# Patient Record
Sex: Female | Born: 1945
Health system: Southern US, Community
[De-identification: ages and names within clinical notes are randomized; demographics above are authoritative.]

## PROBLEM LIST (undated history)

## (undated) DIAGNOSIS — R1314 Dysphagia, pharyngoesophageal phase: Secondary | ICD-10-CM

## (undated) DIAGNOSIS — I059 Rheumatic mitral valve disease, unspecified: Secondary | ICD-10-CM

## (undated) DIAGNOSIS — E042 Nontoxic multinodular goiter: Secondary | ICD-10-CM

## (undated) DIAGNOSIS — M26629 Arthralgia of temporomandibular joint, unspecified side: Secondary | ICD-10-CM

## (undated) DIAGNOSIS — I499 Cardiac arrhythmia, unspecified: Secondary | ICD-10-CM

## (undated) DIAGNOSIS — D126 Benign neoplasm of colon, unspecified: Secondary | ICD-10-CM

## (undated) DIAGNOSIS — F32A Depression, unspecified: Secondary | ICD-10-CM

## (undated) DIAGNOSIS — R011 Cardiac murmur, unspecified: Secondary | ICD-10-CM

## (undated) DIAGNOSIS — F329 Major depressive disorder, single episode, unspecified: Secondary | ICD-10-CM

## (undated) DIAGNOSIS — I480 Paroxysmal atrial fibrillation: Secondary | ICD-10-CM

## (undated) DIAGNOSIS — C801 Malignant (primary) neoplasm, unspecified: Secondary | ICD-10-CM

## (undated) DIAGNOSIS — R002 Palpitations: Secondary | ICD-10-CM

## (undated) DIAGNOSIS — H269 Unspecified cataract: Secondary | ICD-10-CM

## (undated) DIAGNOSIS — M940 Chondrocostal junction syndrome [Tietze]: Secondary | ICD-10-CM

## (undated) DIAGNOSIS — I351 Nonrheumatic aortic (valve) insufficiency: Secondary | ICD-10-CM

## (undated) HISTORY — DX: Benign neoplasm of colon, unspecified: D12.6

## (undated) HISTORY — PX: EYE SURGERY: SHX253

## (undated) HISTORY — PX: COSMETIC SURGERY: SHX468

## (undated) HISTORY — PX: DILATION AND CURETTAGE OF UTERUS: SHX78

## (undated) HISTORY — PX: EXCISIONAL HEMORRHOIDECTOMY: SHX1541

## (undated) HISTORY — PX: CATARACT EXTRACTION, BILATERAL: SHX1313

## (undated) HISTORY — DX: Rheumatic mitral valve disease, unspecified: I05.9

## (undated) HISTORY — DX: Cardiac murmur, unspecified: R01.1

## (undated) HISTORY — PX: COLONOSCOPY W/ POLYPECTOMY: SHX1380

## (undated) HISTORY — DX: Arthralgia of temporomandibular joint, unspecified side: M26.629

## (undated) HISTORY — DX: Nonrheumatic aortic (valve) insufficiency: I35.1

## (undated) HISTORY — DX: Dysphagia, pharyngoesophageal phase: R13.14

## (undated) HISTORY — PX: TUBAL LIGATION: SHX77

## (undated) HISTORY — DX: Palpitations: R00.2

## (undated) HISTORY — DX: Paroxysmal atrial fibrillation: I48.0

## (undated) HISTORY — DX: Unspecified cataract: H26.9

---

## 1998-09-05 ENCOUNTER — Other Ambulatory Visit: Admission: RE | Admit: 1998-09-05 | Discharge: 1998-09-05 | Payer: Self-pay | Admitting: Obstetrics and Gynecology

## 1999-09-07 ENCOUNTER — Other Ambulatory Visit: Admission: RE | Admit: 1999-09-07 | Discharge: 1999-09-07 | Payer: Self-pay | Admitting: Obstetrics and Gynecology

## 1999-11-15 ENCOUNTER — Encounter (INDEPENDENT_AMBULATORY_CARE_PROVIDER_SITE_OTHER): Payer: Self-pay

## 1999-11-15 ENCOUNTER — Other Ambulatory Visit: Admission: RE | Admit: 1999-11-15 | Discharge: 1999-11-15 | Payer: Self-pay | Admitting: Obstetrics and Gynecology

## 2000-10-10 ENCOUNTER — Other Ambulatory Visit: Admission: RE | Admit: 2000-10-10 | Discharge: 2000-10-10 | Payer: Self-pay | Admitting: Obstetrics and Gynecology

## 2001-12-19 ENCOUNTER — Encounter: Payer: Self-pay | Admitting: Obstetrics and Gynecology

## 2001-12-19 ENCOUNTER — Encounter: Admission: RE | Admit: 2001-12-19 | Discharge: 2001-12-19 | Payer: Self-pay | Admitting: Obstetrics and Gynecology

## 2002-02-05 ENCOUNTER — Encounter: Admission: RE | Admit: 2002-02-05 | Discharge: 2002-02-05 | Payer: Self-pay | Admitting: Obstetrics and Gynecology

## 2002-02-05 ENCOUNTER — Encounter: Payer: Self-pay | Admitting: Obstetrics and Gynecology

## 2002-02-27 ENCOUNTER — Ambulatory Visit (HOSPITAL_BASED_OUTPATIENT_CLINIC_OR_DEPARTMENT_OTHER): Admission: RE | Admit: 2002-02-27 | Discharge: 2002-02-27 | Payer: Self-pay | Admitting: Surgery

## 2002-02-27 ENCOUNTER — Encounter (INDEPENDENT_AMBULATORY_CARE_PROVIDER_SITE_OTHER): Payer: Self-pay | Admitting: Specialist

## 2003-01-13 ENCOUNTER — Other Ambulatory Visit: Admission: RE | Admit: 2003-01-13 | Discharge: 2003-01-13 | Payer: Self-pay | Admitting: Obstetrics and Gynecology

## 2003-07-02 ENCOUNTER — Encounter: Payer: Self-pay | Admitting: Internal Medicine

## 2003-07-02 DIAGNOSIS — K648 Other hemorrhoids: Secondary | ICD-10-CM | POA: Insufficient documentation

## 2004-02-21 ENCOUNTER — Other Ambulatory Visit: Admission: RE | Admit: 2004-02-21 | Discharge: 2004-02-21 | Payer: Self-pay | Admitting: Obstetrics and Gynecology

## 2005-04-11 ENCOUNTER — Other Ambulatory Visit: Admission: RE | Admit: 2005-04-11 | Discharge: 2005-04-11 | Payer: Self-pay | Admitting: Obstetrics and Gynecology

## 2007-06-10 ENCOUNTER — Ambulatory Visit: Payer: Self-pay | Admitting: Internal Medicine

## 2007-09-22 DIAGNOSIS — I059 Rheumatic mitral valve disease, unspecified: Secondary | ICD-10-CM

## 2007-09-22 DIAGNOSIS — E079 Disorder of thyroid, unspecified: Secondary | ICD-10-CM | POA: Insufficient documentation

## 2007-09-22 DIAGNOSIS — R002 Palpitations: Secondary | ICD-10-CM | POA: Insufficient documentation

## 2007-09-22 DIAGNOSIS — D126 Benign neoplasm of colon, unspecified: Secondary | ICD-10-CM

## 2007-09-22 HISTORY — DX: Benign neoplasm of colon, unspecified: D12.6

## 2007-09-22 HISTORY — DX: Rheumatic mitral valve disease, unspecified: I05.9

## 2010-05-17 ENCOUNTER — Ambulatory Visit: Payer: Self-pay | Admitting: Cardiology

## 2010-11-15 ENCOUNTER — Encounter: Payer: Self-pay | Admitting: Cardiology

## 2010-11-16 ENCOUNTER — Encounter: Payer: Self-pay | Admitting: Cardiology

## 2010-11-16 ENCOUNTER — Ambulatory Visit (INDEPENDENT_AMBULATORY_CARE_PROVIDER_SITE_OTHER): Payer: BC Managed Care – PPO | Admitting: Cardiology

## 2010-11-16 DIAGNOSIS — R002 Palpitations: Secondary | ICD-10-CM

## 2010-11-16 DIAGNOSIS — M26629 Arthralgia of temporomandibular joint, unspecified side: Secondary | ICD-10-CM

## 2010-11-16 DIAGNOSIS — M2669 Other specified disorders of temporomandibular joint: Secondary | ICD-10-CM

## 2010-11-16 HISTORY — DX: Arthralgia of temporomandibular joint, unspecified side: M26.629

## 2010-11-16 NOTE — Assessment & Plan Note (Signed)
Since we last saw the patient she has had a terrible time with painful TMJ syndrome on the left side.  She's been experiencing difficulty for 3 months.  She has seen her dentist and also some specialists.  Presently she is taking 4 ibuprofen twice a day for anti-inflammatory.

## 2010-11-16 NOTE — Assessment & Plan Note (Signed)
The patient has been having occasional palpitations.  Her palpitations have been worse and she been having the discomfort from her TMJ syndrome.  She remains on atenolol 50 mg twice a day.

## 2010-11-16 NOTE — Progress Notes (Signed)
Robin Collins Date of Birth:  1945-11-29 [facility] Card[facility] / [facility] [address]   [address] [place], [place]  [address] [phone]           Fax   [phone]  HPI: This pleasant 65 year old woman is seen for a six-month followup office visit.  He has a past history of palpitations.  Her last echocardiogram was in April 2010 which showed mild aortic insufficiency and trace mitral regurgitation and normal left ventricle systolic function.  The patient does not have any history of coronary disease.  She had a normal stress Cardiolite on 05/08/02.  She has not been expressing any chest pain or angina.  Current Outpatient Prescriptions  Medication Sig Dispense Refill  . ALPRAZolam (XANAX) 0.25 MG tablet Take 0.25 mg by mouth as needed.        Marland Kitchen aspirin 81 MG tablet Take 81 mg by mouth daily.        Marland Kitchen atenolol (TENORMIN) 50 MG tablet Take 50 mg by mouth 2 (two) times daily.        . busPIRone (BUSPAR) 10 MG tablet Take 10 mg by mouth daily.        . calcium carbonate 200 MG capsule Take 250 mg by mouth daily.        . cholecalciferol (VITAMIN D) 1000 UNITS tablet Take 1,000 Units by mouth daily.        . Multiple Vitamin (MULTIVITAMIN) tablet Take 1 tablet by mouth daily.        . raloxifene (EVISTA) 60 MG tablet Take 60 mg by mouth daily.          Allergies  Allergen Reactions  . Vioxx (Rofecoxib)     palpitations    Patient Active Problem List  Diagnoses  . COLONIC POLYPS, ADENOMATOUS  . THYROID DISORDER  . MITRAL VALVE PROLAPSE  . INTERNAL HEMORRHOIDS  . Palpitations  . Temporomandibular joint-pain-dysfunction syndrome (TMJ)    History  Smoking status  . Former Smoker  Smokeless tobacco  . Not on file    History  Alcohol Use: Not on file    No family history on file.  Review of Systems: The patient denies any heat or cold intolerance.  No weight gain or weight loss.  The patient denies headaches or blurry vision.  There is no cough or sputum  production.  The patient denies dizziness.  There is no hematuria or hematochezia.  The patient denies any muscle aches or arthritis.  The patient denies any rash.  The patient denies frequent falling or instability.  There is no history of depression or anxiety.  All other systems were reviewed and are negative.   Physical Exam: Filed Vitals:   11/16/10 1522  BP: 130/68  Pulse: 64  Weight is 106, down 1 pound.  The general appearance reveals a well-developed well-nourished woman in no distress.Pupils equal and reactive.   Extraocular Movements are full.  There is no scleral icterus.  The mouth and pharynx are normal.  The neck is supple.  The carotids reveal no bruits.  The jugular venous pressure is normal.  The thyroid is not enlarged.  There is no lymphadenopathy.  She has difficulty opening her mouth very wide because of the TMJ pain on the left side.The chest is clear to percussion and auscultation. There are no rales or rhonchi. Expansion of the chest is symmetrical.The precordium is quiet.  The first heart sound is normal.  The second heart sound is physiologically split.  There is no murmur gallop rub or click.  There is no abnormal lift or heave.The abdomen is soft and nontender. Bowel sounds are normal. The liver and spleen are not enlarged. There Are no abdominal masses. There are no bruits. Normal extremity no phlebitis or edema.  Pedal pulses are good.Strength is normal and symmetrical in all extremities.  There is no lateralizing weakness.  There are no sensory deficits.The skin is warm and dry.  There is no rash.    Assessment / Plan: Continue same medication.  Recheck in 6 months

## 2010-12-12 NOTE — Assessment & Plan Note (Signed)
[facility]                         GASTROENTEROLOGY OFFICE NOTE   Robin Collins, Robin Collins                      MRN:          [id number]  DATE:06/10/2007                            DOB:          [birth year]    Robin Collins is a very nice 65 year old white female who is here today  because of acute abdominal pain which started about 14 days ago.  It  woke her up at 4 a.m., and was followed by a series of loose and watery  stools.  She had at least 8 to 12 of them following 24 hours, and  continued to be sick for about 10 days.  Only the past 3 days, her bowel  habits have normalized to the point where she had only 2 bowel movements  yesterday, and it is starting to form.  She has not seen any blood in  her stools, but she did see a little mucus.  There was no fever.  No  nausea or vomiting.  Patient was eating full liquids, and very low  residue diet.  We sent Robin Collins in the past for colonoscopy.  She  had an adenomatous polyp of the colon in March 1999, and the last  colonoscopy in December 2004, which was a normal exam, and did not show  any evidence of diverticulosis.  She has a positive family history of  colon polyps in her sister and niece.  She, herself, was diagnosed with  colitis in the past, but we are not sure about the documentation of it,  and what kind of colitis.   MEDICATIONS:  1. Tenormin 50 mg p.o. daily for mitral valve prolapse and      palpitations.  She has been on this medication for many years.  2. BuSpar 10 mg daily.  3. Evista 60 mg daily.  4. Xanax 1/2 of 2.5 mg p.r.n.  5. Aspirin 81 mg p.o. daily.  6. Calcium.  7. Multivitamins.  8. Vitamin E.   PAST HISTORY:  Significant for:  1. Thyroid problems.  2. Mitral valve prolapse.  3. Palpitations.  4. She had hemorrhoid surgery in the past.  5. Tubal ligation.  6. Breast surgery 4 years ago.   FAMILY HISTORY:  Positive for diabetes in mother, brother, sisters.  Breast cancer in maternal aunt.   SOCIAL HISTORY:  She is married.  No children.  She has a high school  education.  The is Interior and spatial designer of [employer].  She does not smoke.  Drinks alcohol occasionally.   REVIEW OF SYSTEMS:  Negative, except for history of present illness.   PHYSICAL EXAMINATION:  Blood pressure 120/74.  Pulse 68.  Weight 110  pounds.  Her usual weight was 115 pounds.  She was in no distress.  LUNGS:  Clear to auscultation.  COR:  Normal S1 and S2.  ABDOMEN:  Soft.  Scaphoid.  Nontender.  I could not elicit any  tenderness.  There was no distention.  Her bowel sounds were  normoactive.  RECTAL EXAM:  Stool was soft and Hemoccult negative.  No external  hemorrhoids.   IMPRESSION:  A  65 year old white female with acute colitis, which is now  resolving.  From the description of it, I think she had ischemic  colitis.  Other possibility would have been infectious gastroenteritis,  or possibly diverticulitis, but the description of the pain and sudden  onset of it is more consistent with low flow state.  She does not drink  enough liquids during the day.  She also takes Tenormin for  palpitations.  Her baseline blood pressure has been rather low.  The  pain woke her up in the middle of the night, which often happens with  ischemic colitis because of blood pressure at night.  She is now 95%  improved, so it would be hard to prove the diagnosis at this point, but  her exam is negative, and CT scan of the abdomen would not be likely to  show anything.  There is no evidence of occult gastrointestinal blood  loss.   PLAN:  1. I have asked her to drink a lot of liquids.  2. When she sees Dr. Patty Sermons next time, I would suggest that he may      want to cut back on her Tenormin to 25 mg daily if it is feasible      at all as far as her palpitations are concerned.  3. I would like to see her if she has an acute attack again.  We would      most likely get a CT scan of the  abdomen.  She will be due for      colonoscopy in 10 years, which is December 2014, unless she      develops recurrent episodes of abdominal pain and bleeding.     Hedwig Morton. Juanda Chance, MD  Electronically Signed    DMB/MedQ  DD: 06/10/2007  DT: 06/11/2007  Job #: [id number]   cc:   C. Duane Lope, M.D.  Cassell Clement, M.D.

## 2010-12-15 NOTE — Op Note (Signed)
   NAME:  Flossie, Wexler NO.:  [id number]   MEDICAL RECORD NO.:  [id number]                   PATIENT TYPE:   LOCATION:                                       FACILITY:   PHYSICIAN:  Currie Paris, M.D.           DATE OF BIRTH:   DATE OF PROCEDURE:  DATE OF DISCHARGE:                                 OPERATIVE REPORT   OFFICE MEDICAL RECORD NUMBER:  [id number]   PREOPERATIVE DIAGNOSIS:  Small right breast mass.   POSTOPERATIVE DIAGNOSIS:  Small right breast mass.   PROCEDURE:  Removal of right breast mass.   SURGEON:  Currie Paris, M.D.   ANESTHESIA:  MAC   CLINICAL HISTORY:  This patient is a 65 year old with a small ridge of  tissue that produced a nodular density that was asymmetric from the left  side.  This is in the right breast at the 11:30 position.  It was felt  likely to be benign, but because of the asymmetry and the density,  excisional biopsy was recommended.   DESCRIPTION OF PROCEDURE:  The patient was seen in the holding area and had  no further questions.  She was taken to the operating room, and the mass was  identified and marked.  It was just adjacent to the areolar margin at the  11:30 position of the right breast.  The patient was given IV sedation, the  breast prepped and draped and anesthetized with 1% Xylocaine.  A curvilinear  circumferential incision was made at the areolar margin.  A skin flap was  raised towards the mass, and this appeared to be dense fibrotic beast tissue  with some tiny cystic changes consistent with fibrocystic disease.  This  area was excised.   Bleeding was controlled with cautery and incision closed in layers with 3-0  Vicryl followed by 4-0 Monocryl subcuticular and Steri-Strips.  The patient  tolerated the procedure well.  There were no operative complications.  All  counts were correct.                                               Currie Paris, M.D.    CJS/MEDQ  D:   02/27/2002  T:  03/04/2002  Job:  [id number]   cc:   Guy Sandifer. Arleta Creek, M.D.

## 2011-06-29 ENCOUNTER — Ambulatory Visit (INDEPENDENT_AMBULATORY_CARE_PROVIDER_SITE_OTHER): Payer: BC Managed Care – PPO | Admitting: Cardiology

## 2011-06-29 ENCOUNTER — Encounter: Payer: Self-pay | Admitting: Cardiology

## 2011-06-29 VITALS — BP 100/70 | HR 60 | Ht 63.0 in | Wt 104.0 lb

## 2011-06-29 DIAGNOSIS — R0789 Other chest pain: Secondary | ICD-10-CM

## 2011-06-29 DIAGNOSIS — M940 Chondrocostal junction syndrome [Tietze]: Secondary | ICD-10-CM

## 2011-06-29 DIAGNOSIS — R002 Palpitations: Secondary | ICD-10-CM

## 2011-06-29 DIAGNOSIS — R071 Chest pain on breathing: Secondary | ICD-10-CM

## 2011-06-29 NOTE — Assessment & Plan Note (Signed)
The patient has a history of chronic chest pain secondary to costochondritis.  Recently Dr. Tenny Craw gave her a Medrol Dosepak and she had significant relief short-term.

## 2011-06-29 NOTE — Progress Notes (Signed)
Robin Collins Date of Birth:  [birth year] [facility] Card[facility] / [facility] [address]   [address] [place], [place]  [address] [phone]           Fax   [phone]  HPI: This pleasant 65 year old woman is seen for a scheduled 6 month followup office visit.  She has a past history of palpitations.  As a has a history of costochondritis of the chest wall.  His last visit she's been doing well except for a recent upper respiratory infection.  He did get the flu shot from Dr. Tenny Craw.  She is scheduled to get a Tdap and a shingles shot from Dr. Tenny Craw.  Current Outpatient Prescriptions  Medication Sig Dispense Refill  . ALPRAZolam (XANAX) 0.25 MG tablet Take 0.25 mg by mouth as needed.        Marland Kitchen aspirin 81 MG tablet Take 81 mg by mouth daily.        Marland Kitchen atenolol (TENORMIN) 50 MG tablet Take 50 mg by mouth 2 (two) times daily.        . busPIRone (BUSPAR) 10 MG tablet Take 10 mg by mouth daily.        . calcium carbonate 200 MG capsule Take 250 mg by mouth daily.        . cholecalciferol (VITAMIN D) 1000 UNITS tablet Take 1,000 Units by mouth daily.        . Multiple Vitamin (MULTIVITAMIN) tablet Take 1 tablet by mouth daily.        . raloxifene (EVISTA) 60 MG tablet Take 60 mg by mouth daily.          Allergies  Allergen Reactions  . Vioxx (Rofecoxib)     palpitations    Patient Active Problem List  Diagnoses  . COLONIC POLYPS, ADENOMATOUS  . THYROID DISORDER  . MITRAL VALVE PROLAPSE  . INTERNAL HEMORRHOIDS  . Palpitations  . Temporomandibular joint-pain-dysfunction syndrome (TMJ)    History  Smoking status  . Former Smoker  Smokeless tobacco  . Not on file    History  Alcohol Use: Not on file    No family history on file.  Review of Systems: The patient denies any heat or cold intolerance.  No weight gain or weight loss.  The patient denies headaches or blurry vision.  There is no cough or sputum production.  The patient denies dizziness.  There is no hematuria  or hematochezia.  The patient denies any muscle aches or arthritis.  The patient denies any rash.  The patient denies frequent falling or instability.  There is no history of depression or anxiety.  All other systems were reviewed and are negative.   Physical Exam: Filed Vitals:   06/29/11 1549  BP: 100/70  Pulse: 60   the general appearance reveals a well-developed well-nourished woman in no distress.The head and neck exam reveals pupils equal and reactive.  Extraocular movements are full.  There is no scleral icterus.  The mouth and pharynx are normal.  The neck is supple.  The carotids reveal no bruits.  The jugular venous pressure is normal.  The  thyroid is not enlarged.  There is no lymphadenopathy.  The chest is clear to percussion and auscultation.  There are no rales or rhonchi.  Expansion of the chest is symmetrical.  The precordium is quiet.  The first heart sound is normal.  The second heart sound is physiologically split.  There is no murmur gallop rub or click.  There is no  abnormal lift or heave.  The abdomen is soft and nontender.  The bowel sounds are normal.  The liver and spleen are not enlarged.  There are no abdominal masses.  There are no abdominal bruits.  Extremities reveal good pedal pulses.  There is no phlebitis or edema.  There is no cyanosis or clubbing.  Strength is normal and symmetrical in all extremities.  There is no lateralizing weakness.  There are no sensory deficits.  The skin is warm and dry.  There is no rash.      Assessment / Plan: Continue same medication.  Recheck in 6 months for followup office visit and EKG.

## 2011-06-29 NOTE — Assessment & Plan Note (Signed)
Since last visit the patient's palpitations have been about the same.  She does have to take atenolol occasionally.

## 2011-06-29 NOTE — Patient Instructions (Signed)
Your physician recommends that you continue on your current medications as directed. Please refer to the Current Medication list given to you today.  Your physician wants you to follow-up in: 6 months. You will receive a reminder letter in the mail two months in advance. If you don't receive a letter, please call our office to schedule the follow-up appointment.  

## 2011-07-09 ENCOUNTER — Other Ambulatory Visit: Payer: Self-pay | Admitting: *Deleted

## 2011-07-09 MED ORDER — BUSPIRONE HCL 10 MG PO TABS: 10.0000 mg | ORAL_TABLET | Freq: Every day | ORAL | Status: DC

## 2011-07-09 MED ORDER — ATENOLOL 50 MG PO TABS: 50.0000 mg | ORAL_TABLET | Freq: Two times a day (BID) | ORAL | Status: DC

## 2011-07-09 NOTE — Telephone Encounter (Signed)
Refills on atenolol and budpirone

## 2011-11-02 ENCOUNTER — Other Ambulatory Visit: Payer: Self-pay | Admitting: Cardiology

## 2011-11-02 DIAGNOSIS — F419 Anxiety disorder, unspecified: Secondary | ICD-10-CM

## 2011-11-12 ENCOUNTER — Other Ambulatory Visit: Payer: Self-pay | Admitting: Cardiology

## 2011-11-12 DIAGNOSIS — F419 Anxiety disorder, unspecified: Secondary | ICD-10-CM

## 2011-11-12 MED ORDER — ALPRAZOLAM 0.25 MG PO TABS: 0.2500 mg | ORAL_TABLET | Freq: Every day | ORAL | Status: DC

## 2011-11-12 NOTE — Telephone Encounter (Signed)
Refill on alprazolam 

## 2011-11-20 ENCOUNTER — Other Ambulatory Visit: Payer: Self-pay | Admitting: Dermatology

## 2011-12-26 ENCOUNTER — Ambulatory Visit: Payer: BC Managed Care – PPO | Admitting: Cardiology

## 2011-12-28 ENCOUNTER — Ambulatory Visit (INDEPENDENT_AMBULATORY_CARE_PROVIDER_SITE_OTHER): Payer: BC Managed Care – PPO | Admitting: Cardiology

## 2011-12-28 ENCOUNTER — Encounter: Payer: Self-pay | Admitting: Cardiology

## 2011-12-28 VITALS — BP 123/60 | HR 72 | Resp 18 | Ht 63.0 in | Wt 104.8 lb

## 2011-12-28 DIAGNOSIS — R002 Palpitations: Secondary | ICD-10-CM

## 2011-12-28 DIAGNOSIS — M940 Chondrocostal junction syndrome [Tietze]: Secondary | ICD-10-CM

## 2011-12-28 NOTE — Patient Instructions (Signed)
Your physician recommends that you continue on your current medications as directed. Please refer to the Current Medication list given to you today.  Your physician wants you to follow-up in: 6 month. You will receive a reminder letter in the mail two months in advance. If you don't receive a letter, please call our office to schedule the follow-up appointment.  

## 2011-12-28 NOTE — Assessment & Plan Note (Signed)
The patient has had only mild palpitations since last visit.  EKG today shows no premature beats

## 2011-12-28 NOTE — Progress Notes (Signed)
Wyatt Haste Date of Birth:  Mar 24, 1946 [facility] [address] [place], [place]  [address] [phone]  Fax   [phone]  HPI: This pleasant 66 year old woman is seen for a scheduled followup office visit.  He has a past history of palpitations and a history of atypical chest pain secondary to costochondritis of the chest wall.  She had a normal nuclear stress test in 2003 showing no ischemia and her ejection fraction was 65%.  An echocardiogram in April 2010 showing normal systolic and diastolic function with mild aortic insufficiency and trace March regurgitation.  Since last visit she's been doing well.  She's had minimal palpitations.  She continues to walk regularly for exercise.  Not had any severe chest pain.  Previous TMJ problem has improved.  Since we last saw her she had skin surgery on the lesion on her nose by Dr. Irene Limbo with good outcome and it was a basal cell carcinoma. Current Outpatient Prescriptions  Medication Sig Dispense Refill  . ALPRAZolam (XANAX) 0.25 MG tablet Take 1 tablet (0.25 mg total) by mouth daily.  30 tablet  5  . aspirin 81 MG tablet Take 81 mg by mouth daily.        Marland Kitchen atenolol (TENORMIN) 50 MG tablet Take 1 tablet (50 mg total) by mouth 2 (two) times daily.  180 tablet  3  . busPIRone (BUSPAR) 10 MG tablet Take 1 tablet (10 mg total) by mouth daily.  90 tablet  3  . calcium carbonate 200 MG capsule Take 250 mg by mouth daily.        . cholecalciferol (VITAMIN D) 1000 UNITS tablet Take 1,000 Units by mouth daily.        . Multiple Vitamin (MULTIVITAMIN) tablet Take 1 tablet by mouth daily.        . raloxifene (EVISTA) 60 MG tablet Take 60 mg by mouth daily.          Allergies  Allergen Reactions  . Vioxx (Rofecoxib)     palpitations    Patient Active Problem List  Diagnoses  . COLONIC POLYPS, ADENOMATOUS  . THYROID DISORDER  . MITRAL VALVE PROLAPSE  . INTERNAL HEMORRHOIDS  . Palpitations  . Temporomandibular  joint-pain-dysfunction syndrome (TMJ)  . Costochondritis    History  Smoking status  . Former Smoker  Smokeless tobacco  . Not on file    History  Alcohol Use: Not on file    No family history on file.  Review of Systems: The patient denies any heat or cold intolerance.  No weight gain or weight loss.  The patient denies headaches or blurry vision.  There is no cough or sputum production.  The patient denies dizziness.  There is no hematuria or hematochezia.  The patient denies any muscle aches or arthritis.  The patient denies any rash.  The patient denies frequent falling or instability.  There is no history of depression or anxiety.  All other systems were reviewed and are negative.   Physical Exam: Filed Vitals:   12/28/11 1530  BP: 123/60  Pulse: 72  Resp: 18   general appearance reveals a well-developed well-nourished large woman in no distress.The head and neck exam reveals pupils equal and reactive.  Extraocular movements are full.  There is no scleral icterus.  The mouth and pharynx are normal.  The neck is supple.  The carotids reveal no bruits.  The jugular venous pressure is normal.  The  thyroid is not enlarged.  There is  no lymphadenopathy.  The chest is clear to percussion and auscultation.  There are no rales or rhonchi.  Expansion of the chest is symmetrical.  The precordium is quiet.  The first heart sound is normal.  The second heart sound is physiologically split.  There is no murmur gallop rub or click.  There is no abnormal lift or heave.  The abdomen is soft and nontender.  The bowel sounds are normal.  The liver and spleen are not enlarged.  There are no abdominal masses.  There are no abdominal bruits.  Extremities reveal good pedal pulses.  There is no phlebitis or edema.  There is no cyanosis or clubbing.  Strength is normal and symmetrical in all extremities.  There is no lateralizing weakness.  There are no sensory deficits.  The skin is warm and dry.  There is  no rash.  EKG shows normal sinus rhythm and is within normal limits    Assessment / Plan: Continue same medication.  Continue low-dose atenolol 50 mg twice a day.  Recheck in 6 months for followup office visit

## 2011-12-28 NOTE — Assessment & Plan Note (Signed)
The patient has had no flareup of her costochondritis or chest pain since last visit

## 2012-07-11 ENCOUNTER — Other Ambulatory Visit: Payer: Self-pay

## 2012-07-11 MED ORDER — ATENOLOL 50 MG PO TABS: 50.0000 mg | ORAL_TABLET | Freq: Two times a day (BID) | ORAL | Status: DC

## 2012-07-11 MED ORDER — BUSPIRONE HCL 10 MG PO TABS: 10.0000 mg | ORAL_TABLET | Freq: Every day | ORAL | Status: DC

## 2012-07-15 ENCOUNTER — Other Ambulatory Visit: Payer: Self-pay | Admitting: Cardiology

## 2012-07-15 MED ORDER — ATENOLOL 50 MG PO TABS: 50.0000 mg | ORAL_TABLET | Freq: Two times a day (BID) | ORAL | Status: DC

## 2012-08-07 ENCOUNTER — Encounter: Payer: Self-pay | Admitting: Cardiology

## 2012-08-07 ENCOUNTER — Other Ambulatory Visit: Payer: Self-pay | Admitting: Cardiology

## 2012-08-07 ENCOUNTER — Ambulatory Visit (INDEPENDENT_AMBULATORY_CARE_PROVIDER_SITE_OTHER): Payer: BC Managed Care – PPO | Admitting: Cardiology

## 2012-08-07 VITALS — BP 110/60 | HR 60 | Ht 63.5 in | Wt 107.0 lb

## 2012-08-07 DIAGNOSIS — E079 Disorder of thyroid, unspecified: Secondary | ICD-10-CM

## 2012-08-07 DIAGNOSIS — F411 Generalized anxiety disorder: Secondary | ICD-10-CM

## 2012-08-07 DIAGNOSIS — M2669 Other specified disorders of temporomandibular joint: Secondary | ICD-10-CM

## 2012-08-07 DIAGNOSIS — F419 Anxiety disorder, unspecified: Secondary | ICD-10-CM

## 2012-08-07 DIAGNOSIS — R002 Palpitations: Secondary | ICD-10-CM

## 2012-08-07 DIAGNOSIS — M26629 Arthralgia of temporomandibular joint, unspecified side: Secondary | ICD-10-CM

## 2012-08-07 MED ORDER — ALPRAZOLAM 0.25 MG PO TABS
0.2500 mg | ORAL_TABLET | Freq: Every day | ORAL | Status: DC
Start: 2012-08-07 — End: 2013-02-20

## 2012-08-07 MED ORDER — BUSPIRONE HCL 10 MG PO TABS: 10.0000 mg | ORAL_TABLET | Freq: Every day | ORAL | Status: DC

## 2012-08-07 NOTE — Assessment & Plan Note (Signed)
Clinically she appears to be euthyroid.

## 2012-08-07 NOTE — Assessment & Plan Note (Signed)
Her TMJ problem is no longer painful but she still can feel it popping in and out of joint and it still bothers her.

## 2012-08-07 NOTE — Patient Instructions (Addendum)
Your physician recommends that you continue on your current medications as directed. Please refer to the Current Medication list given to you today.  Your physician wants you to follow-up in: 6 month ov/ekg You will receive a reminder letter in the mail two months in advance. If you don't receive a letter, please call our office to schedule the follow-up appointment.  

## 2012-08-07 NOTE — Assessment & Plan Note (Signed)
Her palpitations have been remaining under reasonably good control.  She has not been experiencing any additional chest pain.  No dyspnea and she walks regularly for exercise and stays physically fit.

## 2012-08-07 NOTE — Progress Notes (Signed)
Robin Collins Date of Birth:  1946-06-08 [facility] [address] [place], [place]  [address] [phone]  Fax   [phone]  HPI: This pleasant 67 year old woman is seen for a scheduled followup office visit. He has a past history of palpitations and a history of atypical chest pain secondary to costochondritis of the chest wall. She had a normal nuclear stress test in 2003 showing no ischemia and her ejection fraction was 65%. An echocardiogram in April 2010 showing normal systolic and diastolic function with mild aortic insufficiency and trace March regurgitation. Since last visit she's been doing well. She's had minimal palpitations. She continues to walk regularly for exercise. Not had any severe chest pain. Previous TMJ problem has improved.  Since last visit she's had no new cardiac symptoms.    Current Outpatient Prescriptions  Medication Sig Dispense Refill  . ALPRAZolam (XANAX) 0.25 MG tablet Take 1 tablet (0.25 mg total) by mouth daily.  30 tablet  5  . aspirin 81 MG tablet Take 81 mg by mouth daily.        Marland Kitchen atenolol (TENORMIN) 50 MG tablet Take 1 tablet (50 mg total) by mouth 2 (two) times daily.  180 tablet  2  . calcium carbonate 200 MG capsule Take 250 mg by mouth daily.        . cholecalciferol (VITAMIN D) 1000 UNITS tablet Take 1,000 Units by mouth daily.        . Multiple Vitamin (MULTIVITAMIN) tablet Take 1 tablet by mouth daily.        . raloxifene (EVISTA) 60 MG tablet Take 60 mg by mouth daily.        . busPIRone (BUSPAR) 10 MG tablet Take 1 tablet (10 mg total) by mouth daily.  90 tablet  3    Allergies  Allergen Reactions  . Vioxx (Rofecoxib)     palpitations    Patient Active Problem List  Diagnosis  . COLONIC POLYPS, ADENOMATOUS  . THYROID DISORDER  . MITRAL VALVE PROLAPSE  . INTERNAL HEMORRHOIDS  . Palpitations  . Temporomandibular joint-pain-dysfunction syndrome (TMJ)  . Costochondritis    History  Smoking status  .  Former Smoker  Smokeless tobacco  . Not on file    History  Alcohol Use: Not on file    No family history on file.  Review of Systems: The patient denies any heat or cold intolerance.  No weight gain or weight loss.  The patient denies headaches or blurry vision.  There is no cough or sputum production.  The patient denies dizziness.  There is no hematuria or hematochezia.  The patient denies any muscle aches or arthritis.  The patient denies any rash.  The patient denies frequent falling or instability.  There is no history of depression or anxiety.  All other systems were reviewed and are negative.   Physical Exam: Filed Vitals:   08/07/12 1528  BP: 110/60  Pulse: 60   the general appearance is that of a well-developed well-nourished woman in no distress.  Her weight is up 3 pounds since last visit.The head and neck exam reveals pupils equal and reactive.  Extraocular movements are full.  There is no scleral icterus.  The mouth and pharynx are normal.  The neck is supple.  The carotids reveal no bruits.  The jugular venous pressure is normal.  The  thyroid is not enlarged.  There is no lymphadenopathy.  The chest is clear to percussion and auscultation.  There are no  rales or rhonchi.  Expansion of the chest is symmetrical.  The precordium is quiet.  The first heart sound is normal.  The second heart sound is physiologically split.  There is no murmur gallop rub or click.  There is no abnormal lift or heave.  The abdomen is soft and nontender.  The bowel sounds are normal.  The liver and spleen are not enlarged.  There are no abdominal masses.  There are no abdominal bruits.  Extremities reveal good pedal pulses.  There is no phlebitis or edema.  There is no cyanosis or clubbing.  Strength is normal and symmetrical in all extremities.  There is no lateralizing weakness.  There are no sensory deficits.  The skin is warm and dry.  There is no rash.      Assessment / Plan: Continue same  medication.  Recheck in 6 months for office visit and EKG.

## 2012-09-13 ENCOUNTER — Other Ambulatory Visit: Payer: Self-pay

## 2012-12-18 ENCOUNTER — Other Ambulatory Visit: Payer: Self-pay

## 2013-02-20 ENCOUNTER — Encounter: Payer: Self-pay | Admitting: Cardiology

## 2013-02-20 ENCOUNTER — Ambulatory Visit (INDEPENDENT_AMBULATORY_CARE_PROVIDER_SITE_OTHER): Payer: BC Managed Care – PPO | Admitting: Cardiology

## 2013-02-20 VITALS — BP 118/72 | HR 59 | Ht 63.5 in | Wt 105.8 lb

## 2013-02-20 DIAGNOSIS — R002 Palpitations: Secondary | ICD-10-CM

## 2013-02-20 DIAGNOSIS — F411 Generalized anxiety disorder: Secondary | ICD-10-CM

## 2013-02-20 DIAGNOSIS — F419 Anxiety disorder, unspecified: Secondary | ICD-10-CM

## 2013-02-20 MED ORDER — ALPRAZOLAM 0.25 MG PO TABS: 0.2500 mg | ORAL_TABLET | Freq: Every day | ORAL | Status: DC

## 2013-02-20 NOTE — Assessment & Plan Note (Signed)
Her palpitations continue to be very infrequent.  Her beta blocker is working well and we will continue same dose.

## 2013-02-20 NOTE — Patient Instructions (Addendum)
Your physician recommends that you continue on your current medications as directed. Please refer to the Current Medication list given to you today.  Your physician wants you to follow-up in: 6 month ov You will receive a reminder letter in the mail two months in advance. If you don't receive a letter, please call our office to schedule the follow-up appointment.  

## 2013-02-20 NOTE — Progress Notes (Signed)
Robin Collins Date of Birth:  01/26/1946 [facility] [address] [place], [place]  [address] [phone]  Fax   [phone]  HPI: This pleasant 67 year old woman is seen for a scheduled followup office visit.  She has a past history of palpitations and a history of atypical chest pain secondary to costochondritis of the chest wall. She had a normal nuclear stress test in 2003 showing no ischemia and her ejection fraction was 65%. An echocardiogram in April 2010 showing normal systolic and diastolic function with mild aortic insufficiency and trace March regurgitation. Since last visit she's been doing well. She's had minimal palpitations. She continues to walk regularly for exercise. Not had any severe chest pain. Previous TMJ problem has improved. Since last visit she's had no new cardiac symptoms.    Current Outpatient Prescriptions  Medication Sig Dispense Refill  . ALPRAZolam (XANAX) 0.25 MG tablet Take 1 tablet (0.25 mg total) by mouth daily.  90 tablet  1  . aspirin 81 MG tablet Take 81 mg by mouth daily.        Marland Kitchen atenolol (TENORMIN) 50 MG tablet Take 1 tablet (50 mg total) by mouth 2 (two) times daily.  180 tablet  2  . busPIRone (BUSPAR) 10 MG tablet Take 1 tablet (10 mg total) by mouth daily.  90 tablet  3  . calcium carbonate 200 MG capsule Take 250 mg by mouth daily.        . cholecalciferol (VITAMIN D) 1000 UNITS tablet Take 1,000 Units by mouth daily.        . Multiple Vitamin (MULTIVITAMIN) tablet Take 1 tablet by mouth daily.         No current facility-administered medications for this visit.    Allergies  Allergen Reactions  . Vioxx (Rofecoxib)     palpitations    Patient Active Problem List   Diagnosis Date Noted  . Temporomandibular joint-pain-dysfunction syndrome (TMJ) 11/16/2010    Priority: High  . Palpitations 09/22/2007    Priority: Medium  . Costochondritis 06/29/2011  . COLONIC POLYPS, ADENOMATOUS 09/22/2007  . THYROID  DISORDER 09/22/2007  . MITRAL VALVE PROLAPSE 09/22/2007  . INTERNAL HEMORRHOIDS 07/02/2003    History  Smoking status  . Former Smoker  Smokeless tobacco  . Not on file    History  Alcohol Use: Not on file    No family history on file.  Review of Systems: The patient denies any heat or cold intolerance.  No weight gain or weight loss.  The patient denies headaches or blurry vision.  There is no cough or sputum production.  The patient denies dizziness.  There is no hematuria or hematochezia.  The patient denies any muscle aches or arthritis.  The patient denies any rash.  The patient denies frequent falling or instability.  There is no history of depression or anxiety.  All other systems were reviewed and are negative.   Physical Exam: Filed Vitals:   02/20/13 1332  BP: 118/72  Pulse: 59   the general appearance reveals a well-developed well-nourished woman in no distress.The head and neck exam reveals pupils equal and reactive.  Extraocular movements are full.  There is no scleral icterus.  The mouth and pharynx are normal.  The neck is supple.  The carotids reveal no bruits.  The jugular venous pressure is normal.  The  thyroid is not enlarged.  There is no lymphadenopathy.  The chest is clear to percussion and auscultation.  There are no rales or  rhonchi.  Expansion of the chest is symmetrical.  The precordium is quiet.  The first heart sound is normal.  The second heart sound is physiologically split.  There is no murmur gallop rub or click.  There is no abnormal lift or heave.  The abdomen is soft and nontender.  The bowel sounds are normal.  The liver and spleen are not enlarged.  There are no abdominal masses.  There are no abdominal bruits.  Extremities reveal good pedal pulses.  There is no phlebitis or edema.  There is no cyanosis or clubbing.  Strength is normal and symmetrical in all extremities.  There is no lateralizing weakness.  There are no sensory deficits.  The skin is  warm and dry.  There is no rash.  EKG shows sinus bradycardia at 59 per minute and is otherwise within normal limits.   Assessment / Plan:  Continue same medication.  Recheck in 6 months

## 2013-03-04 ENCOUNTER — Other Ambulatory Visit: Payer: Self-pay

## 2013-03-26 ENCOUNTER — Telehealth: Payer: Self-pay | Admitting: *Deleted

## 2013-03-26 ENCOUNTER — Other Ambulatory Visit: Payer: Self-pay | Admitting: *Deleted

## 2013-03-26 MED ORDER — ATENOLOL 50 MG PO TABS: 50.0000 mg | ORAL_TABLET | Freq: Two times a day (BID) | ORAL | Status: DC

## 2013-03-31 MED ORDER — BUSPIRONE HCL 10 MG PO TABS: 10.0000 mg | ORAL_TABLET | Freq: Every day | ORAL | Status: DC

## 2013-03-31 NOTE — Telephone Encounter (Signed)
Refilled as requested  

## 2013-06-04 ENCOUNTER — Other Ambulatory Visit: Payer: Self-pay

## 2013-06-08 ENCOUNTER — Encounter: Payer: Self-pay | Admitting: Internal Medicine

## 2013-07-04 ENCOUNTER — Ambulatory Visit (INDEPENDENT_AMBULATORY_CARE_PROVIDER_SITE_OTHER): Payer: BC Managed Care – PPO | Admitting: Family Medicine

## 2013-07-04 ENCOUNTER — Ambulatory Visit: Payer: BC Managed Care – PPO

## 2013-07-04 VITALS — BP 110/68 | HR 71 | Temp 98.3°F | Resp 18 | Ht 63.0 in | Wt 105.0 lb

## 2013-07-04 DIAGNOSIS — R040 Epistaxis: Secondary | ICD-10-CM

## 2013-07-04 DIAGNOSIS — S0120XA Unspecified open wound of nose, initial encounter: Secondary | ICD-10-CM

## 2013-07-04 DIAGNOSIS — J3489 Other specified disorders of nose and nasal sinuses: Secondary | ICD-10-CM

## 2013-07-04 NOTE — Patient Instructions (Addendum)
Apply ice to nose for 15 or 20 minutes for 5 times daily for a day or 2.   Tylenol or ibuprofen for pain  WOUND CARE Please return in 5 days to have your stitches/staples removed or sooner if you have concerns. Marland Kitchen Keep area clean and dry for 24 hours. Do not remove bandage, if applied. . After 24 hours, remove bandage and wash wound gently with mild soap and warm water. Reapply a new bandage after cleaning wound, if directed. . Continue daily cleansing with soap and water until stitches/staples are removed. . Do not apply any ointments or creams to the wound while stitches/staples are in place, as this may cause delayed healing. . Notify the office if you experience any of the following signs of infection: Swelling, redness, pus drainage, streaking, fever >101.0 F . Notify the office if you experience excessive bleeding that does not stop after 15-20 minutes of constant, firm pressure.

## 2013-07-04 NOTE — Progress Notes (Signed)
Verbal consent obtained from patient.  Local anesthesia with 2cc Lidocaine 2% without epinephrine.  Wound scrubbed with soap and water and rinsed.  Wound closed with #3 5-0 Prolene (#1 simple interrupted, and #2 purse string) sutures.  Wound cleansed and dressed.

## 2013-07-04 NOTE — Progress Notes (Signed)
Subjective: Patient tripped and fell straight forward on her nose onto the hardwood floor about a half hour ago.  Her nose bled a lot from the right nares. She came straight here.  She apparently has had nose septoplasty in the past.  Objective: No acute distress. Staff reports that she has an unsteady gait. No is tender. Swollen on the right side. Has a stellate laceration of the bridge of the nose. Right nares has a lot of blood and swelling. Left looks slightly reddened. Patient is alert and oriented.  Assessment: Nose injury new Epistaxis  Plan: X-ray [facility] reading (PRIMARY) by  Dr. Alwyn Ren 2 small fractures, nondisplaced.  Will need sutures in her nose. Use ice to bridge of nose.

## 2013-07-09 ENCOUNTER — Encounter: Payer: Self-pay | Admitting: Physician Assistant

## 2013-07-09 ENCOUNTER — Ambulatory Visit (INDEPENDENT_AMBULATORY_CARE_PROVIDER_SITE_OTHER): Payer: BC Managed Care – PPO | Admitting: Physician Assistant

## 2013-07-09 VITALS — BP 122/62 | HR 69 | Temp 98.6°F | Resp 16 | Ht 63.0 in | Wt 104.2 lb

## 2013-07-09 DIAGNOSIS — S0120XD Unspecified open wound of nose, subsequent encounter: Secondary | ICD-10-CM

## 2013-07-09 DIAGNOSIS — Z5189 Encounter for other specified aftercare: Secondary | ICD-10-CM

## 2013-07-09 NOTE — Progress Notes (Signed)
   Subjective:    Patient ID: Robin Collins, female    DOB: [birth year], 67 y.o.   MRN: [id number]  PCP:  Duane Lope, MD  Chief Complaint  Patient presents with  . Suture / Staple Removal    nose    HPI  She is doing well.  No problems or concerns.  Bruising developed along the LEFT nose and cheek and is now resolving.  No drainage.  She is accompanied by her husband today.  Review of Systems     Objective:   Physical Exam  #3 prolene sutures are in place.  No erythema, edema or drainage.  Resolving ecchymosis noted along the LEFT side of the nose and cheek, consistent with her injury. Scab noted.  Sutures easily removed without incident.      Assessment & Plan:  1. Wound, open, nose, subsequent encounter Local wound care.  Anticipatory guidance.  RTC PRN.   Fernande Bras, PA-C Physician Assistant-Certified [facility]

## 2013-08-14 ENCOUNTER — Ambulatory Visit (AMBULATORY_SURGERY_CENTER): Payer: Self-pay

## 2013-08-14 VITALS — Ht 63.0 in | Wt 106.0 lb

## 2013-08-14 DIAGNOSIS — Z860100 Personal history of colon polyps, unspecified: Secondary | ICD-10-CM

## 2013-08-14 MED ORDER — MOVIPREP 100 G PO SOLR: 1.0000 | Freq: Once | ORAL | Status: DC

## 2013-08-17 ENCOUNTER — Encounter: Payer: Self-pay | Admitting: Internal Medicine

## 2013-08-24 ENCOUNTER — Telehealth: Payer: Self-pay | Admitting: Internal Medicine

## 2013-08-24 NOTE — Telephone Encounter (Signed)
Pt was calling to let us know that she has finished taking a Z Pak and will have completed Prednisone before coming in for procedure on Friday 1/30.

## 2013-08-28 ENCOUNTER — Encounter: Payer: Self-pay | Admitting: Internal Medicine

## 2013-08-28 ENCOUNTER — Ambulatory Visit (AMBULATORY_SURGERY_CENTER): Payer: BC Managed Care – PPO | Admitting: Internal Medicine

## 2013-08-28 VITALS — BP 117/69 | HR 58 | Temp 98.3°F | Resp 17 | Ht 63.0 in | Wt 106.0 lb

## 2013-08-28 DIAGNOSIS — Z1211 Encounter for screening for malignant neoplasm of colon: Secondary | ICD-10-CM

## 2013-08-28 DIAGNOSIS — Z860100 Personal history of colon polyps, unspecified: Secondary | ICD-10-CM

## 2013-08-28 MED ORDER — SODIUM CHLORIDE 0.9 % IV SOLN: 500.0000 mL | INTRAVENOUS | Status: DC

## 2013-08-28 NOTE — Progress Notes (Signed)
A purpleish colored bruise to the top of her right hand from missed IV. I gave the pt a warm pack to applyto to of her hand. Maw  No complaints noted in the recovery room. Maw

## 2013-08-28 NOTE — Progress Notes (Signed)
Procedure ends, to recovery, report given and VSS. 

## 2013-08-28 NOTE — Patient Instructions (Signed)

## 2013-08-28 NOTE — Op Note (Signed)
[place]  [address] [place], [address]   COLONOSCOPY PROCEDURE REPORT  PATIENT: Robin Collins, Robin Collins  MR#: [id number] BIRTHDATE: 1946/05/02 , 37  yrs. old GENDER: Female ENDOSCOPIST: Lafayette Dragon, MD REFERRED XT:KWIO Ross, M.D. PROCEDURE DATE:  08/28/2013 PROCEDURE:   Colonoscopy, screening First Screening Colonoscopy - Avg.  risk and is 50 yrs.  old or older - No.  Prior Negative Screening - Now for repeat screening. N/A  History of Adenoma - Now for follow-up colonoscopy & has been > or = to 3 yrs.  Yes hx of adenoma.  Has been 3 or more years since last colonoscopy.  Polyps Removed Today? No.  Recommend repeat exam, <10 yrs? No. ASA CLASS:   Class II INDICATIONS:history of adenomatous polyp in March 1999.  Last colonoscopy in December 2004 was normal except for hemorrhoids. MEDICATIONS: MAC sedation, administered by CRNA and Propofol (Diprivan) 320 mg IV  DESCRIPTION OF PROCEDURE:   After the risks benefits and alternatives of the procedure were thoroughly explained, informed consent was obtained.  A digital rectal exam revealed no abnormalities of the rectum.   The LB [id number]-[id number]  endoscope was introduced through the anus and advanced to the cecum, which was identified by both the appendix and ileocecal valve. No adverse events experienced.   The quality of the prep was good, using MoviPrep  The instrument was then slowly withdrawn as the colon was fully examined.      COLON FINDINGS: Melanosis coli throughout the colon First grade hemorrhoids.  Retroflexed views revealed no abnormalities. The time to cecum=6 minutes 42 seconds.  Withdrawal time=6 minutes 04 seconds.  The scope was withdrawn and the procedure completed. COMPLICATIONS: There were no complications.  ENDOSCOPIC IMPRESSION: Melanosis coli throughout the colon First grade hemorrhoids  RECOMMENDATIONS: high fiber diet Stop taking herbal laxatives recall colonoscopy in 10  years   eSigned:  Lafayette Dragon, MD 08/28/2013 10:05 AM   cc:

## 2013-08-31 ENCOUNTER — Telehealth: Payer: Self-pay | Admitting: *Deleted

## 2013-08-31 NOTE — Telephone Encounter (Signed)
  Follow up Call-  Call back number 08/28/2013  Post procedure Call Back phone  # [phone]  Permission to leave phone message Yes     Patient questions:  Do you have a fever, pain , or abdominal swelling? no Pain Score  0 *  Have you tolerated food without any problems? yes  Have you been able to return to your normal activities? yes  Do you have any questions about your discharge instructions: Diet   no Medications  no Follow up visit  no  Do you have questions or concerns about your Care? no  Actions: * If pain score is 4 or above: No action needed, pain <4.

## 2013-09-09 ENCOUNTER — Ambulatory Visit (INDEPENDENT_AMBULATORY_CARE_PROVIDER_SITE_OTHER): Payer: BC Managed Care – PPO | Admitting: Cardiology

## 2013-09-09 ENCOUNTER — Encounter: Payer: Self-pay | Admitting: Cardiology

## 2013-09-09 VITALS — BP 138/74 | HR 64 | Ht 63.0 in | Wt 103.0 lb

## 2013-09-09 DIAGNOSIS — M940 Chondrocostal junction syndrome [Tietze]: Secondary | ICD-10-CM

## 2013-09-09 DIAGNOSIS — I059 Rheumatic mitral valve disease, unspecified: Secondary | ICD-10-CM

## 2013-09-09 DIAGNOSIS — M2669 Other specified disorders of temporomandibular joint: Secondary | ICD-10-CM

## 2013-09-09 DIAGNOSIS — M26629 Arthralgia of temporomandibular joint, unspecified side: Secondary | ICD-10-CM

## 2013-09-09 DIAGNOSIS — R002 Palpitations: Secondary | ICD-10-CM

## 2013-09-09 NOTE — Assessment & Plan Note (Signed)
She really notices any palpitations now.  Her exercise tolerance is good and she continues to walk regularly.  Her weight is down 2 pounds since last visit.

## 2013-09-09 NOTE — Patient Instructions (Signed)
Your physician recommends that you continue on your current medications as directed. Please refer to the Current Medication list given to you today.  Your physician wants you to follow-up in: 6 MONTH OV /EKG You will receive a reminder letter in the mail two months in advance. If you don't receive a letter, please call our office to schedule the follow-up appointment.  

## 2013-09-09 NOTE — Assessment & Plan Note (Signed)
Her TMJ symptoms have improved with time and she is careful about what she chews.

## 2013-09-09 NOTE — Progress Notes (Signed)
Robin Collins Date of Birth:  07-24-1946 [address] [place], [place]  [address] [phone]  Fax   [phone]  HPI: This pleasant 68 year old woman is seen for a scheduled followup office visit.  She has a past history of palpitations and a history of atypical chest pain secondary to costochondritis of the chest wall. She had a normal nuclear stress test in 2003 showing no ischemia and her ejection fraction was 65%. An echocardiogram in April 2010 showing normal systolic and diastolic function with mild aortic insufficiency and trace mitral regurgitation. Since last visit she's been doing well. She's had minimal palpitations. She continues to walk regularly for exercise. Not had any severe chest pain. Previous TMJ problem has improved. Since last visit she's had no new cardiac symptoms.  Today her blood pressure was low higher than usual although still normal.  She had to rush to get her from [place].  Current Outpatient Prescriptions  Medication Sig Dispense Refill  . ALPRAZolam (XANAX) 0.25 MG tablet Take 0.25 mg by mouth as needed.      Marland Kitchen aspirin 81 MG tablet Take 81 mg by mouth daily.        Marland Kitchen atenolol (TENORMIN) 50 MG tablet Take 1 tablet (50 mg total) by mouth 2 (two) times daily.  180 tablet  2  . busPIRone (BUSPAR) 10 MG tablet Take 1 tablet (10 mg total) by mouth daily.  90 tablet  0  . calcium carbonate 200 MG capsule Take 250 mg by mouth daily.        . cholecalciferol (VITAMIN D) 1000 UNITS tablet Take 1,000 Units by mouth daily.        . Multiple Vitamin (MULTIVITAMIN) tablet Take 1 tablet by mouth daily.         No current facility-administered medications for this visit.    Allergies  Allergen Reactions  . Augmentin [Amoxicillin-Pot Clavulanate] Nausea And Vomiting  . Vioxx [Rofecoxib]     palpitations    Patient Active Problem List   Diagnosis Date Noted  . Temporomandibular joint-pain-dysfunction syndrome (TMJ) 11/16/2010    Priority: High    . Palpitations 09/22/2007    Priority: Medium  . Costochondritis 06/29/2011  . COLONIC POLYPS, ADENOMATOUS 09/22/2007  . THYROID DISORDER 09/22/2007  . MITRAL VALVE PROLAPSE 09/22/2007  . INTERNAL HEMORRHOIDS 07/02/2003    History  Smoking status  . Former Smoker  Smokeless tobacco  . Never Used    History  Alcohol Use  . Yes    Comment: 2-3 times monthly shots of liquor/mixed drinks    Family History  Problem Relation Age of Onset  . Kidney disease Father     Review of Systems: The patient denies any heat or cold intolerance.  No weight gain or weight loss.  The patient denies headaches or blurry vision.  There is no cough or sputum production.  The patient denies dizziness.  There is no hematuria or hematochezia.  The patient denies any muscle aches or arthritis.  The patient denies any rash.  The patient denies frequent falling or instability.  There is no history of depression or anxiety.  All other systems were reviewed and are negative.   Physical Exam: Filed Vitals:   09/09/13 1601  BP: 138/74  Pulse:    the general appearance reveals a well-developed well-nourished woman in no distress.The head and neck exam reveals pupils equal and reactive.  Extraocular movements are full.  There is no scleral icterus.  The mouth and  pharynx are normal.  The neck is supple.  The carotids reveal no bruits.  The jugular venous pressure is normal.  The  thyroid is not enlarged.  There is no lymphadenopathy.  The chest is clear to percussion and auscultation.  There are no rales or rhonchi.  Expansion of the chest is symmetrical.  The precordium is quiet.  The first heart sound is normal.  The second heart sound is physiologically split.  There is no murmur gallop rub or click.  There is no abnormal lift or heave.  The abdomen is soft and nontender.  The bowel sounds are normal.  The liver and spleen are not enlarged.  There are no abdominal masses.  There are no abdominal bruits.   Extremities reveal good pedal pulses.  There is no phlebitis or edema.  There is no cyanosis or clubbing.  Strength is normal and symmetrical in all extremities.  There is no lateralizing weakness.  There are no sensory deficits.  The skin is warm and dry.  There is no rash.     Assessment / Plan:  Continue same medication.  Recheck in 6 months for office visit and EKG

## 2013-09-09 NOTE — Assessment & Plan Note (Signed)
The patient is not having a recent palpitations or chest discomfort.

## 2013-12-15 ENCOUNTER — Telehealth: Payer: Self-pay | Admitting: *Deleted

## 2013-12-15 MED ORDER — ALPRAZOLAM 0.25 MG PO TABS: 0.2500 mg | ORAL_TABLET | ORAL | Status: DC | PRN

## 2013-12-15 NOTE — Telephone Encounter (Signed)
Patient requests alprazolam refill be called in to [facility]. Thanks, MI

## 2014-03-12 ENCOUNTER — Ambulatory Visit: Payer: BC Managed Care – PPO | Admitting: Cardiology

## 2014-03-23 ENCOUNTER — Other Ambulatory Visit: Payer: Self-pay | Admitting: Cardiology

## 2014-03-24 ENCOUNTER — Encounter: Payer: Self-pay | Admitting: Cardiology

## 2014-03-24 ENCOUNTER — Ambulatory Visit (INDEPENDENT_AMBULATORY_CARE_PROVIDER_SITE_OTHER): Payer: BC Managed Care – PPO | Admitting: Cardiology

## 2014-03-24 VITALS — BP 122/68 | HR 54 | Ht 63.0 in | Wt 104.8 lb

## 2014-03-24 DIAGNOSIS — R002 Palpitations: Secondary | ICD-10-CM

## 2014-03-24 DIAGNOSIS — E079 Disorder of thyroid, unspecified: Secondary | ICD-10-CM

## 2014-03-24 DIAGNOSIS — M940 Chondrocostal junction syndrome [Tietze]: Secondary | ICD-10-CM

## 2014-03-24 DIAGNOSIS — I059 Rheumatic mitral valve disease, unspecified: Secondary | ICD-10-CM

## 2014-03-24 NOTE — Patient Instructions (Signed)
Your physician recommends that you continue on your current medications as directed. Please refer to the Current Medication list given to you today.  Your physician wants you to follow-up in: 6 month ov You will receive a reminder letter in the mail two months in advance. If you don't receive a letter, please call our office to schedule the follow-up appointment.  

## 2014-03-24 NOTE — Assessment & Plan Note (Signed)
No recent chest pain.  No palpitations or tachycardia.  No dizziness or syncope

## 2014-03-24 NOTE — Progress Notes (Signed)
Robin Collins Date of Birth:  31-Oct-1945 Robin Collins, [place]  [address] [phone]  Fax   [phone]  HPI: This pleasant 68 year old woman is seen for a scheduled followup office visit. She has a past history of palpitations and a history of atypical chest pain secondary to costochondritis of the chest wall. She had a normal nuclear stress test in 2003 showing no ischemia and her ejection fraction was 65%. An echocardiogram in April 2010 showing normal systolic and diastolic function with mild aortic insufficiency and trace mitral regurgitation. Since last visit she's been doing well. She's had minimal palpitations. She continues to walk regularly for exercise. Not had any severe chest pain. Previous TMJ problem has improved. Since last visit she's had no new cardiac symptoms.   Current Outpatient Prescriptions  Medication Sig Dispense Refill  . ALPRAZolam (XANAX) 0.25 MG tablet Take 1 tablet (0.25 mg total) by mouth as needed.  30 tablet  3  . aspirin 81 MG tablet Take 81 mg by mouth daily.        Marland Kitchen atenolol (TENORMIN) 50 MG tablet BID      . busPIRone (BUSPAR) 10 MG tablet TAKE 1 BY MOUTH DAILY (FUTURE REFILLS NEED TO BE OBTAINED BY PCP)  90 tablet  0  . calcium carbonate 200 MG capsule Take 250 mg by mouth daily.        . cholecalciferol (VITAMIN D) 1000 UNITS tablet Take 1,000 Units by mouth daily.        . Multiple Vitamin (MULTIVITAMIN) tablet Take 1 tablet by mouth daily.         No current facility-administered medications for this visit.    Allergies  Allergen Reactions  . Augmentin [Amoxicillin-Pot Clavulanate] Nausea And Vomiting  . Vioxx [Rofecoxib]     palpitations    Patient Active Problem List   Diagnosis Date Noted  . Temporomandibular joint-pain-dysfunction syndrome (TMJ) 11/16/2010    Priority: High  . Palpitations 09/22/2007    Priority: Medium  . Costochondritis 06/29/2011  . COLONIC POLYPS, ADENOMATOUS  09/22/2007  . THYROID DISORDER 09/22/2007  . MITRAL VALVE PROLAPSE 09/22/2007  . INTERNAL HEMORRHOIDS 07/02/2003    History  Smoking status  . Former Smoker  Smokeless tobacco  . Never Used    History  Alcohol Use  . Yes    Comment: 2-3 times monthly shots of liquor/mixed drinks    Family History  Problem Relation Age of Onset  . Kidney disease Father     Review of Systems: The patient denies any heat or cold intolerance.  No weight gain or weight loss.  The patient denies headaches or blurry vision.  There is no cough or sputum production.  The patient denies dizziness.  There is no hematuria or hematochezia.  The patient denies any muscle aches or arthritis.  The patient denies any rash.  The patient denies frequent falling or instability.  There is no history of depression or anxiety.  All other systems were reviewed and are negative.   Physical Exam: Filed Vitals:   03/24/14 1530  BP: 122/68  Pulse: 54  The patient appears to be in no distress.  Head and neck exam reveals that the pupils are equal and reactive.  The extraocular movements are full.  There is no scleral icterus.  Mouth and pharynx are benign.  No lymphadenopathy.  No carotid bruits.  The jugular venous pressure is normal.  Thyroid is not enlarged or tender.  Chest is clear to percussion and auscultation.  No rales or rhonchi.  Expansion of the chest is symmetrical.  Heart reveals no abnormal lift or heave.  First and second heart sounds are normal.  There is no murmur gallop rub or click.  The abdomen is soft and nontender.  Bowel sounds are normoactive.  There is no hepatosplenomegaly or mass.  There are no abdominal bruits.  Extremities reveal no phlebitis or edema.  Pedal pulses are good.  There is no cyanosis or clubbing.  Neurologic exam is normal strength and no lateralizing weakness.  No sensory deficits.  Integument reveals no rash  EKG shows sinus bradycardia, otherwise  normal    Assessment / Plan: 1. history of palpitations 2. history of mitral valve prolapse 3. history of chest pain secondary to costochondritis,  Disposition: Continue same medication.  Continue regular activity and walking regularly. Recheck in 6 months

## 2014-03-24 NOTE — Assessment & Plan Note (Signed)
She has not had any recent flareup of her costochondritis pain

## 2014-03-24 NOTE — Assessment & Plan Note (Signed)
Patient is clinically euthyroid

## 2014-05-14 ENCOUNTER — Other Ambulatory Visit: Payer: Self-pay

## 2014-06-04 ENCOUNTER — Other Ambulatory Visit: Payer: Self-pay | Admitting: Cardiology

## 2014-06-04 NOTE — Telephone Encounter (Signed)
Last refill in 8/15 states that future refills need to be filled through PCP. You can check with Rip Harbour and Dr. Mare Ferrari to see if they want to refill, or just go ahead send it to her PCP.

## 2014-06-05 ENCOUNTER — Ambulatory Visit (INDEPENDENT_AMBULATORY_CARE_PROVIDER_SITE_OTHER): Payer: BC Managed Care – PPO | Admitting: Internal Medicine

## 2014-06-05 VITALS — BP 127/72 | HR 64 | Temp 98.3°F | Resp 16 | Ht 63.0 in | Wt 106.0 lb

## 2014-06-05 DIAGNOSIS — H538 Other visual disturbances: Secondary | ICD-10-CM

## 2014-06-05 DIAGNOSIS — H5711 Ocular pain, right eye: Secondary | ICD-10-CM

## 2014-06-05 NOTE — Patient Instructions (Signed)
Blurred Vision °You have been seen today complaining of blurred vision. This means you have a loss of ability to see small details.  °CAUSES  °Blurred vision can be a symptom of underlying eye problems, such as: °· Aging of the eye (presbyopia). °· Glaucoma. °· Cataracts. °· Eye infection. °· Eye-related migraine. °· Diabetes mellitus. °· Fatigue. °· Migraine headaches. °· High blood pressure. °· Breakdown of the back of the eye (macular degeneration). °· Problems caused by some medications. °The most common cause of blurred vision is the need for eyeglasses or a new prescription. Today in the emergency department, no cause for your blurred vision can be found. °SYMPTOMS  °Blurred vision is the loss of visual sharpness and detail (acuity). °DIAGNOSIS  °Should blurred vision continue, you should see your caregiver. If your caregiver is your primary care physician, he or she may choose to refer you to another specialist.  °TREATMENT  °Do not ignore your blurred vision. Make sure to have it checked out to see if further treatment or referral is necessary. °SEEK MEDICAL CARE IF:  °You are unable to get into a specialist so we can help you with a referral. °SEEK IMMEDIATE MEDICAL CARE IF: °You have severe eye pain, severe headache, or sudden loss of vision. °MAKE SURE YOU:  °· Understand these instructions. °· Will watch your condition. °· Will get help right away if you are not doing well or get worse. °Document Released: 07/19/2003 Document Revised: 10/08/2011 Document Reviewed: 02/18/2008 °ExitCare® Patient Information ©2015 [employer]. This information is not intended to replace advice given to you by your health care provider. Make sure you discuss any questions you have with your health care provider. ° °

## 2014-06-05 NOTE — Progress Notes (Signed)
   Subjective:    Patient ID: Robin Collins, female    DOB: 06-08-1946, 68 y.o.   MRN: [id number]  HPI  Patient here today with right eye pain and redness.  Patient had cataract surgery on 05/04/14.  Patient also just had her one month check up with her eye surgeon Dr. Monna Fam on Thursday 06/03/14 with no complaints but Dr. Herbert Deaner told her at that appointment her eye looked irritated but thought it was from the drops so she told pt to stop eye drops at that time.  She had no problems until about 2 am this morning with terrible pain and redness.  Patient tried to contact Dr. Herbert Deaner and the on call doctor without success today so she came to urgent care.  No fever, no uri sxs. Has blurred vision compared to other post catarract eye.   Review of Systems     Objective:   Physical Exam  Constitutional: She is oriented to person, place, and time. She appears well-developed and well-nourished. She appears distressed.  HENT:  Head: Normocephalic.  Right Ear: External ear normal.  Left Ear: External ear normal.  Nose: Nose normal.  Mouth/Throat: Oropharynx is clear and moist.  Eyes: EOM are normal. Pupils are equal, round, and reactive to light. Right eye exhibits no chemosis, no discharge, no exudate and no hordeolum. No foreign body present in the right eye. Left eye exhibits no chemosis, no discharge, no exudate and no hordeolum. No foreign body present in the left eye. Right conjunctiva is injected. Right conjunctiva has no hemorrhage. Left conjunctiva is not injected. Left conjunctiva has no hemorrhage. No scleral icterus.    Right eyelids puffy, not red No purulence Vision less than left  Neck: Normal range of motion. Neck supple.  Cardiovascular: Normal rate.   Pulmonary/Chest: Effort normal.  Lymphadenopathy:    She has no cervical adenopathy.  Neurological: She is alert and oriented to person, place, and time. No cranial nerve deficit. She exhibits normal muscle tone.  Coordination normal.  Skin: No rash noted.  Psychiatric: She has a normal mood and affect. Her behavior is normal. Judgment and thought content normal.  Vitals reviewed.         Assessment & Plan:  Painful red right eye Referred to opthalmology 230 today/Spoke with opthalmologist

## 2014-08-30 ENCOUNTER — Other Ambulatory Visit: Payer: Self-pay | Admitting: Cardiology

## 2014-08-30 DIAGNOSIS — F419 Anxiety disorder, unspecified: Secondary | ICD-10-CM

## 2014-08-31 ENCOUNTER — Other Ambulatory Visit: Payer: Self-pay

## 2014-09-01 NOTE — Telephone Encounter (Signed)
New Message//Med refill   [facility] faxed in a request for refill on 01/23 Buspar. No response for approval. Also there is a script from [facility] for zantac with no response. Pt states she left a message on the Vm with no response as well. Pt refused to leave another VM.  Please call back to assist

## 2014-09-02 ENCOUNTER — Telehealth: Payer: Self-pay

## 2014-09-02 NOTE — Telephone Encounter (Signed)
Dr. Mare Ferrari ok'd Xanax to be refilled, called to [facility]

## 2014-09-23 ENCOUNTER — Other Ambulatory Visit: Payer: Self-pay | Admitting: Obstetrics and Gynecology

## 2014-09-24 LAB — CYTOLOGY - PAP

## 2014-09-25 ENCOUNTER — Other Ambulatory Visit: Payer: Self-pay | Admitting: Cardiology

## 2014-09-25 DIAGNOSIS — F419 Anxiety disorder, unspecified: Secondary | ICD-10-CM

## 2014-09-27 ENCOUNTER — Ambulatory Visit (INDEPENDENT_AMBULATORY_CARE_PROVIDER_SITE_OTHER): Payer: BLUE CROSS/BLUE SHIELD | Admitting: Cardiology

## 2014-09-27 VITALS — BP 120/80 | HR 60 | Ht 63.0 in | Wt 108.8 lb

## 2014-09-27 DIAGNOSIS — R002 Palpitations: Secondary | ICD-10-CM

## 2014-09-27 NOTE — Progress Notes (Signed)
Cardiology Office Note   Date:  09/27/2014   ID:  Robin, Collins [birth year], MRN [id number]  PCP:   Melinda Crutch, MD  Cardiologist:   Darlin Coco, MD   No chief complaint on file.     History of Present Illness: Robin Collins is a 69 y.o. female who presents for a six-month follow-up office visit.  This pleasant 69 year old woman is seen for a scheduled followup office visit. She has a past history of palpitations and a history of atypical chest pain secondary to costochondritis of the chest wall. She had a normal nuclear stress test in 2003 showing no ischemia and her ejection fraction was 65%. An echocardiogram in April 2010 showing normal systolic and diastolic function with mild aortic insufficiency and trace mitral regurgitation. Since last visit she's been doing well. She's had minimal palpitations. She continues to walk regularly for exercise. Not had any severe chest pain. Previous TMJ problem has improved. Since last visit she's had no new cardiac symptoms.  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  Has involved many meetings and long hours.  They have been bringing in food to work as been off her diet and her weight is up 4 pounds since last visit.  She has not had much chance to walk for exercise.  He has not been to the beach since [place].  Past Medical History  Diagnosis Date  . Palpitations   . Cataract   . Heart murmur     Past Surgical History  Procedure Laterality Date  . Cosmetic surgery    . Eye surgery    . Colon surgery       Current Outpatient Prescriptions  Medication Sig Dispense Refill  . ALPRAZolam (XANAX) 0.25 MG tablet TAKE 1 TABLET BY MOUTH DAILY AS NEEDED 30 tablet 5  . aspirin 81 MG tablet Take 81 mg by mouth daily.      Marland Kitchen atenolol (TENORMIN) 50 MG tablet BID    . atenolol (TENORMIN) 50 MG tablet TAKE 1 BY MOUTH TWICE DAILY 180 tablet 1  . busPIRone (BUSPAR) 10 MG tablet TAKE 1 BY MOUTH  DAILY (FUTURE REFILLS NEED TO BE OBTAINED BY PCP) 90 tablet 0  . calcium carbonate 200 MG capsule Take 250 mg by mouth daily.      . cholecalciferol (VITAMIN D) 1000 UNITS tablet Take 1,000 Units by mouth daily.      . Multiple Vitamin (MULTIVITAMIN) tablet Take 1 tablet by mouth daily.       No current facility-administered medications for this visit.    Allergies:   Augmentin and Vioxx    Social History:  The patient  reports that she has quit smoking. She has never used smokeless tobacco. She reports that she drinks alcohol. She reports that she does not use illicit drugs.   Family History:  The patient's family history includes Cancer in her brother and father; Diabetes in her brother, brother, mother, sister, and sister; Kidney disease in her father.    ROS:  Please see the history of present illness.   Otherwise, review of systems are positive for none.   All other systems are reviewed and negative.   PHYSICAL EXAM: VS:  BP 120/80 mmHg  Pulse 60  Ht 5\' 3"  (1.6 m)  Wt 108 lb 12.8 oz (49.351 kg)  BMI 19.28 kg/m2 , BMI Body mass index is 19.28 kg/(m^2). GEN: Well nourished, well developed, in  no acute distress HEENT: normal Neck: no JVD, carotid bruits, or masses Cardiac: RRR; no murmurs, rubs, or gallops,no edema  Respiratory:  clear to auscultation bilaterally, normal work of breathing GI: soft, nontender, nondistended, + BS MS: no deformity or atrophy Skin: warm and dry, no rash Neuro:  Strength and sensation are intact Psych: euthymic mood, full affect   EKG:  EKG is not ordered today.    Recent Labs: No results found for requested labs within last 365 days.    Lipid Panel No results found for: CHOL, TRIG, HDL, CHOLHDL, VLDL, LDLCALC, LDLDIRECT    Wt Readings from Last 3 Encounters:  09/27/14 108 lb 12.8 oz (49.351 kg)  06/05/14 106 lb (48.081 kg)  03/24/14 104 lb 12.8 oz (47.537 kg)         ASSESSMENT AND PLAN:  1. history of palpitations 2.  history of mitral valve prolapse 3. history of chest pain secondary to costochondritis,  Disposition: Continue same medication. Continue regular activity and walking regularly. Recheck in 6 months for follow-up office visit.  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   Current medicines are reviewed at length with the patient today.  The patient does not have concerns regarding medicines.     No orders of the defined types were placed in this encounter.     Disposition:   FU with Dr. Mare Ferrari in 6 months for office visit and EKG   Signed, Darlin Coco, MD  09/27/2014 2:18 PM    [facility] [address], [place], [place]  [address] Phone: [phone]; Fax: [phone]

## 2014-09-27 NOTE — Patient Instructions (Signed)
Your physician recommends that you continue on your current medications as directed. Please refer to the Current Medication list given to you today.  Your physician wants you to follow-up in: 6 month ov You will receive a reminder letter in the mail two months in advance. If you don't receive a letter, please call our office to schedule the follow-up appointment.  

## 2014-09-28 ENCOUNTER — Ambulatory Visit: Payer: BC Managed Care – PPO | Admitting: Cardiology

## 2014-10-13 ENCOUNTER — Other Ambulatory Visit: Payer: Self-pay | Admitting: Cardiology

## 2015-01-07 ENCOUNTER — Other Ambulatory Visit: Payer: Self-pay | Admitting: Cardiology

## 2015-01-19 ENCOUNTER — Telehealth: Payer: Self-pay | Admitting: Internal Medicine

## 2015-01-19 ENCOUNTER — Encounter (HOSPITAL_COMMUNITY): Payer: Self-pay | Admitting: *Deleted

## 2015-01-19 ENCOUNTER — Other Ambulatory Visit: Payer: Self-pay | Admitting: *Deleted

## 2015-01-19 DIAGNOSIS — R1314 Dysphagia, pharyngoesophageal phase: Secondary | ICD-10-CM

## 2015-01-19 NOTE — Telephone Encounter (Signed)
Scheduled at [facility] endo on 01/20/15 at 12:15 PM with MAC and savory. NPO 4 hours prior. Patient aware and MD.

## 2015-01-19 NOTE — H&P (Signed)
69 yo WF with  Recent onset of dysphagia to solids and also to liquids. She denies GERD.No hx of esophageal stricture. She has been very uncomfortable and anxious about her unability to swallow ans is scheduled for EGD and possible dilation. We have seen her in the past for colorectal screening. Diagnosis Date  . Palpitations   . Cataract   . Heart murmur     Past Surgical History  Procedure Laterality Date  . Cosmetic surgery    . Eye surgery    . Colon surgery       Current Outpatient Prescriptions  Medication Sig Dispense Refill  . ALPRAZolam (XANAX) 0.25 MG tablet TAKE 1 TABLET BY MOUTH DAILY AS NEEDED 30 tablet 5  . aspirin 81 MG tablet Take 81 mg by mouth daily.     Marland Kitchen atenolol (TENORMIN) 50 MG tablet BID    . atenolol (TENORMIN) 50 MG tablet TAKE 1 BY MOUTH TWICE DAILY 180 tablet 1  . busPIRone (BUSPAR) 10 MG tablet TAKE 1 BY MOUTH DAILY (FUTURE REFILLS NEED TO BE OBTAINED BY PCP) 90 tablet 0  . calcium carbonate 200 MG capsule Take 250 mg by mouth daily.     . cholecalciferol (VITAMIN D) 1000 UNITS tablet Take 1,000 Units by mouth daily.     . Multiple Vitamin (MULTIVITAMIN) tablet Take 1 tablet by mouth daily.      No current facility-administered medications for this visit.    Allergies: Augmentin and Vioxx    Social History: The patient  reports that she has quit smoking. She has never used smokeless tobacco. She reports that she drinks alcohol. She reports that she does not use illicit drugs.   Family History: The patient's family history includes Cancer in her brother and father; Diabetes in her brother, brother, mother, sister, and sister; Kidney disease in her father.    ROS: Please see the history of present illness. Otherwise, review of systems are positive for none. All other systems are reviewed and negative.   PHYSICAL EXAM: VS: BP 120/80 mmHg  Pulse 60  Ht 5\' 3"   (1.6 m)  Wt 108 lb 12.8 oz (49.351 kg)  BMI 19.28 kg/m2 , BMI Body mass index is 19.28 kg/(m^2). GEN: Well nourished, well developed, in no acute distress  HEENT: normal  Neck: no JVD, carotid bruits, or masses Cardiac: RRR; no murmurs, rubs, or gallops,no edema  Respiratory: clear to auscultation bilaterally, normal work of breathing GI: soft, nontender, nondistended, + BS MS: no deformity or atrophy  Skin: warm and dry, no rash Neuro: Strength and sensation are intact Psych: euthymic mood, full affect   EKG: EKG is not ordered today.    Recent Labs: No results found for requested labs within last 365 days.        Assessment:  Sudden onset dysphagia of unclear etiology, r/o foreign body, r/o Candida esophagitis. R/o esophageal stricture. Plan EGD with poss dilation.

## 2015-01-19 NOTE — Telephone Encounter (Signed)
-----   Message from Lafayette Dragon, MD sent at 01/19/2015  1:09 PM EDT ----- Regarding: EGD tomorrow Rollene Fare, Dr Drema Dallas called about this pt who has a sudden onset of Dysphagia x 3 days, liquids go down somewhat. Can you, please, set her up for EGD at [facility] for tomorrow, conscious sedation. No anticoagulants, not a diabetic. , morning preferrable but will take any time. Cell phone [phone]

## 2015-01-19 NOTE — Telephone Encounter (Signed)
Spoke with Alyse Low and Dr. Drema Dallas saw patient today. She is having difficulty swallowing solids x 4 days. Wanting patient seen today or tomorrow. No appointments with extender or MD. Please, advise.

## 2015-01-19 NOTE — Telephone Encounter (Signed)
Please see my message  Under New Messages

## 2015-01-20 ENCOUNTER — Ambulatory Visit (HOSPITAL_COMMUNITY): Payer: BLUE CROSS/BLUE SHIELD | Admitting: Anesthesiology

## 2015-01-20 ENCOUNTER — Telehealth: Payer: Self-pay

## 2015-01-20 ENCOUNTER — Telehealth: Payer: Self-pay | Admitting: *Deleted

## 2015-01-20 ENCOUNTER — Other Ambulatory Visit: Payer: Self-pay

## 2015-01-20 ENCOUNTER — Telehealth: Payer: Self-pay | Admitting: Internal Medicine

## 2015-01-20 ENCOUNTER — Encounter (HOSPITAL_COMMUNITY)
Admission: RE | Disposition: A | Discharge status: Home / Self Care | Payer: Self-pay | Source: Ambulatory Visit | Attending: Internal Medicine

## 2015-01-20 ENCOUNTER — Ambulatory Visit (HOSPITAL_COMMUNITY)
Admission: RE | Admit: 2015-01-20 | Discharge: 2015-01-20 | Disposition: A | Discharge status: Home / Self Care | Payer: BLUE CROSS/BLUE SHIELD | Source: Ambulatory Visit | Attending: Internal Medicine | Admitting: Internal Medicine

## 2015-01-20 ENCOUNTER — Encounter (HOSPITAL_COMMUNITY): Payer: Self-pay | Admitting: Anesthesiology

## 2015-01-20 DIAGNOSIS — Z7982 Long term (current) use of aspirin: Secondary | ICD-10-CM | POA: Diagnosis not present

## 2015-01-20 DIAGNOSIS — R1314 Dysphagia, pharyngoesophageal phase: Secondary | ICD-10-CM | POA: Diagnosis not present

## 2015-01-20 DIAGNOSIS — F419 Anxiety disorder, unspecified: Secondary | ICD-10-CM | POA: Insufficient documentation

## 2015-01-20 DIAGNOSIS — Z87891 Personal history of nicotine dependence: Secondary | ICD-10-CM | POA: Diagnosis not present

## 2015-01-20 DIAGNOSIS — R1310 Dysphagia, unspecified: Secondary | ICD-10-CM | POA: Insufficient documentation

## 2015-01-20 DIAGNOSIS — J392 Other diseases of pharynx: Secondary | ICD-10-CM | POA: Insufficient documentation

## 2015-01-20 DIAGNOSIS — Z79899 Other long term (current) drug therapy: Secondary | ICD-10-CM | POA: Insufficient documentation

## 2015-01-20 DIAGNOSIS — F329 Major depressive disorder, single episode, unspecified: Secondary | ICD-10-CM | POA: Insufficient documentation

## 2015-01-20 HISTORY — PX: SAVORY DILATION: SHX5439

## 2015-01-20 HISTORY — DX: Depression, unspecified: F32.A

## 2015-01-20 HISTORY — DX: Major depressive disorder, single episode, unspecified: F32.9

## 2015-01-20 HISTORY — DX: Malignant (primary) neoplasm, unspecified: C80.1

## 2015-01-20 HISTORY — DX: Cardiac arrhythmia, unspecified: I49.9

## 2015-01-20 HISTORY — DX: Dysphagia, pharyngoesophageal phase: R13.14

## 2015-01-20 HISTORY — DX: Chondrocostal junction syndrome (tietze): M94.0

## 2015-01-20 HISTORY — DX: Nontoxic multinodular goiter: E04.2

## 2015-01-20 HISTORY — PX: ESOPHAGOGASTRODUODENOSCOPY (EGD) WITH PROPOFOL: SHX5813

## 2015-01-20 SURGERY — ESOPHAGOGASTRODUODENOSCOPY (EGD) WITH PROPOFOL
Anesthesia: Monitor Anesthesia Care

## 2015-01-20 MED ORDER — SODIUM CHLORIDE 0.9 % IV SOLN: INTRAVENOUS | Status: DC

## 2015-01-20 MED ORDER — PROPOFOL INFUSION 10 MG/ML OPTIME
INTRAVENOUS | Status: DC | PRN
  Administered 2015-01-20: 300 ug/kg/min via INTRAVENOUS

## 2015-01-20 MED ORDER — PROPOFOL 10 MG/ML IV BOLUS
INTRAVENOUS | Status: AC
  Filled 2015-01-20: qty 20

## 2015-01-20 MED ORDER — ONDANSETRON HCL 4 MG/2ML IJ SOLN
INTRAMUSCULAR | Status: AC
  Filled 2015-01-20: qty 2

## 2015-01-20 MED ORDER — ONDANSETRON HCL 4 MG/2ML IJ SOLN
INTRAMUSCULAR | Status: DC | PRN
  Administered 2015-01-20: 4 mg via INTRAVENOUS

## 2015-01-20 MED ORDER — LACTATED RINGERS IV SOLN
INTRAVENOUS | Status: DC
  Administered 2015-01-20: 1000 mL via INTRAVENOUS

## 2015-01-20 MED ORDER — MAGIC MOUTHWASH: 5.0000 mL | Freq: Three times a day (TID) | ORAL | Status: DC

## 2015-01-20 MED ORDER — LIDOCAINE HCL (CARDIAC) 20 MG/ML IV SOLN
INTRAVENOUS | Status: DC | PRN
  Administered 2015-01-20: 100 mg via INTRAVENOUS

## 2015-01-20 MED ORDER — LIDOCAINE HCL (CARDIAC) 20 MG/ML IV SOLN
INTRAVENOUS | Status: AC
  Filled 2015-01-20: qty 5

## 2015-01-20 MED ORDER — KETAMINE HCL 10 MG/ML IJ SOLN
INTRAMUSCULAR | Status: DC | PRN
  Administered 2015-01-20: 20 mg via INTRAVENOUS

## 2015-01-20 SURGICAL SUPPLY — 15 items

## 2015-01-20 NOTE — Anesthesia Postprocedure Evaluation (Signed)
  Anesthesia Post-op Note  Patient: Robin Collins  Procedure(s) Performed: Procedure(s) (LRB): ESOPHAGOGASTRODUODENOSCOPY (EGD) WITH PROPOFOL (N/A) SAVORY DILATION (N/A)  Patient Location: PACU  Anesthesia Type: MAC  Level of Consciousness: awake and alert   Airway and Oxygen Therapy: Patient Spontanous Breathing  Post-op Pain: mild  Post-op Assessment: Post-op Vital signs reviewed, Patient's Cardiovascular Status Stable, Respiratory Function Stable, Patent Airway and No signs of Nausea or vomiting  Last Vitals:  Filed Vitals:   01/20/15 1340  BP: 152/86  Pulse: 61  Temp:   Resp: 15    Post-op Vital Signs: stable   Complications: No apparent anesthesia complications

## 2015-01-20 NOTE — Telephone Encounter (Signed)
[place] to ask if patient had picked up the Rx for magic mouthwash. Rosanne Sack, RN, called in Rx and talked directly to the pharmacist. Pharmacist said the patient did pickup the Rx today.

## 2015-01-20 NOTE — Interval H&P Note (Signed)
History and Physical Interval Note:  01/20/2015 11:46 AM  Robin Collins  has presented today for surgery, with the diagnosis of dysphagia  The various methods of treatment have been discussed with the patient and family. After consideration of risks, benefits and other options for treatment, the patient has consented to  Procedure(s): ESOPHAGOGASTRODUODENOSCOPY (EGD) WITH PROPOFOL (N/A) SAVORY DILATION (N/A) as a surgical intervention .  The patient's history has been reviewed, patient examined, no change in status, stable for surgery.  I have reviewed the patient's chart and labs.  Questions were answered to the patient's satisfaction.     Delfin Edis

## 2015-01-20 NOTE — Telephone Encounter (Signed)
-----   Message from Lafayette Dragon, MD sent at 01/19/2015  4:42 PM EDT ----- No x-ray with the Savary ----- Message -----    From: Hulan Saas, RN    Sent: 01/19/2015   2:41 PM      To: Lafayette Dragon, MD  Dr. Olevia Perches, Do you want xray with the savory? Rollene Fare

## 2015-01-20 NOTE — Op Note (Signed)
[facility] [place], [address]   ENDOSCOPY PROCEDURE REPORT  PATIENT: Robin, Collins  MR#: [id number] BIRTHDATE: Aug 03, 1945 , 76  yrs. old GENDER: female ENDOSCOPIST: Lafayette Dragon, MD REFERRED BY:  Leighton Ruff, M.D. PROCEDURE DATE:  01/20/2015 PROCEDURE:  EGD w/ wire guided (savary) dilation ASA CLASS:     Class II INDICATIONS:  dysphagia and sudden onset of dysphagia to solids, able to swallow small amounts of liquids.  No hx of GERD.  Pt is on Buspar for anxiety.  Denies sore throat, cough, hoarsness. MEDICATIONS: Monitored anesthesia care TOPICAL ANESTHETIC: none  DESCRIPTION OF PROCEDURE: After the risks benefits and alternatives of the procedure were thoroughly explained, informed consent was obtained.  The EG-[id number] ([id number]) endoscope was introduced through the mouth and advanced to the second portion of the duodenum , Without limitations.  The instrument was slowly withdrawn as the mucosa was fully examined.      ESOPHAGUS: The mucosa of the esophagus appeared normal. Arytenoids were  slighly edematous but there was no exudate or Candida esophagitis, Squamocolumnar junction was normal. No hiatal hernia, no retained secretions. in the esophagus. The esophageal lumen was not dilated and peristaltic waves were observed. .  76mm (48Fr) Savary dilator passed empirically over guidewire withous resistance. there was no blood on the dilator.  Stomach: . gastric mucosa was normal, retroflexxion  showed normal fundus and cardia Duodenum: duodenal bulb and descending duodenum were normal. The scope was then withdrawn from the patient and the procedure completed.  COMPLICATIONS: There were no immediate complications.  ENDOSCOPIC IMPRESSION: The mucosa of the esophagus appeared normal; There was mild edema in the posterior pharynx 16 mm Savary dilator passes without  resistance  RECOMMENDATIONS: Anti-reflux regimen to be  follow Barium esophagram  to assess swallowing function and to r/o dismotility Majic mouthwash 5 cc po tid  REPEAT EXAM:  eSigned:  Lafayette Dragon, MD 01/20/2015 1:20 PM    CC:  PATIENT NAME:  Robin, Collins MR#: [id number]

## 2015-01-20 NOTE — Discharge Instructions (Signed)
Esophagogastroduodenoscopy °Care After °Refer to this sheet in the next few weeks. These instructions provide you with information on caring for yourself after your procedure. Your caregiver may also give you more specific instructions. Your treatment has been planned according to current medical practices, but problems sometimes occur. Call your caregiver if you have any problems or questions after your procedure.  °HOME CARE INSTRUCTIONS °· Do not eat or drink anything until the numbing medicine (local anesthetic) has worn off and your gag reflex has returned. You will know that the local anesthetic has worn off when you can swallow comfortably. °· Do not drive for 12 hours after the procedure or as directed by your caregiver. °· Only take medicines as directed by your caregiver. °SEEK MEDICAL CARE IF:  °· You cannot stop coughing. °· You are not urinating at all or less than usual. °SEEK IMMEDIATE MEDICAL CARE IF: °· You have difficulty swallowing. °· You cannot eat or drink. °· You have worsening throat or chest pain. °· You have dizziness, lightheadedness, or you faint. °· You have nausea or vomiting. °· You have chills. °· You have a fever. °· You have severe abdominal pain. °· You have black, tarry, or bloody stools. °Document Released: 07/02/2012 Document Reviewed: 07/02/2012 °ExitCare® Patient Information ©2015 [employer]. This information is not intended to replace advice given to you by your health care provider. Make sure you discuss any questions you have with your health care provider. ° °

## 2015-01-20 NOTE — Telephone Encounter (Signed)
Jill notified at [facility] endo.

## 2015-01-20 NOTE — Transfer of Care (Signed)
Immediate Anesthesia Transfer of Care Note  Patient: Robin Collins  Procedure(s) Performed: Procedure(s): ESOPHAGOGASTRODUODENOSCOPY (EGD) WITH PROPOFOL (N/A) SAVORY DILATION (N/A)  Patient Location: PACU  Anesthesia Type:MAC  Level of Consciousness:  sedated, patient cooperative and responds to stimulation  Airway & Oxygen Therapy:Patient Spontanous Breathing and Patient connected to face mask oxgen  Post-op Assessment:  Report given to PACU RN and Post -op Vital signs reviewed and stable  Post vital signs:  Reviewed and stable  Last Vitals:  Filed Vitals:   01/20/15 1038  BP: 158/64  Temp: 36.4 C  Resp: 8    Complications: No apparent anesthesia complications

## 2015-01-20 NOTE — Anesthesia Preprocedure Evaluation (Signed)
Anesthesia Evaluation  Patient identified by MRN, date of birth, ID band Patient awake    Reviewed: Allergy & Precautions, NPO status , Patient's Chart, lab work & pertinent test results  Airway Mallampati: II  TM Distance: >3 FB Neck ROM: Full    Dental no notable dental hx.    Pulmonary neg pulmonary ROS, former smoker,  breath sounds clear to auscultation  Pulmonary exam normal       Cardiovascular Normal cardiovascular exam+ dysrhythmias + Valvular Problems/Murmurs Rhythm:Regular Rate:Normal     Neuro/Psych PSYCHIATRIC DISORDERS Depression negative neurological ROS     GI/Hepatic negative GI ROS, Neg liver ROS,   Endo/Other  negative endocrine ROS  Renal/GU negative Renal ROS  negative genitourinary   Musculoskeletal negative musculoskeletal ROS (+)   Abdominal   Peds negative pediatric ROS (+)  Hematology negative hematology ROS (+)   Anesthesia Other Findings   Reproductive/Obstetrics negative OB ROS                             Anesthesia Physical Anesthesia Plan  ASA: II  Anesthesia Plan: MAC   Post-op Pain Management:    Induction: Intravenous  Airway Management Planned: Natural Airway  Additional Equipment:   Intra-op Plan:   Post-operative Plan:   Informed Consent: I have reviewed the patients History and Physical, chart, labs and discussed the procedure including the risks, benefits and alternatives for the proposed anesthesia with the patient or authorized representative who has indicated his/her understanding and acceptance.   Dental advisory given  Plan Discussed with: CRNA  Anesthesia Plan Comments:         Anesthesia Quick Evaluation

## 2015-01-20 NOTE — Telephone Encounter (Signed)
Pt scheduled for barium esophagram at [facility] 01/26/15@[facility] @11am , pt to arrive there at 10:45am. Pt to be NPO after 8am. Script sent to pharmacy for MMW per Dr. Olevia Perches. Endo RN to notify pt of appt.

## 2015-01-21 ENCOUNTER — Encounter (HOSPITAL_COMMUNITY): Payer: Self-pay | Admitting: Internal Medicine

## 2015-01-24 ENCOUNTER — Other Ambulatory Visit: Payer: Self-pay

## 2015-01-26 ENCOUNTER — Ambulatory Visit (HOSPITAL_COMMUNITY)
Admit: 2015-01-26 | Discharge: 2015-01-26 | Disposition: A | Discharge status: Home / Self Care | Payer: BLUE CROSS/BLUE SHIELD | Source: Ambulatory Visit | Attending: Internal Medicine | Admitting: Internal Medicine

## 2015-01-26 DIAGNOSIS — R1314 Dysphagia, pharyngoesophageal phase: Secondary | ICD-10-CM | POA: Diagnosis present

## 2015-02-22 ENCOUNTER — Other Ambulatory Visit: Payer: Self-pay | Admitting: Cardiology

## 2015-05-12 ENCOUNTER — Encounter: Payer: Self-pay | Admitting: Cardiology

## 2015-05-12 ENCOUNTER — Ambulatory Visit (INDEPENDENT_AMBULATORY_CARE_PROVIDER_SITE_OTHER): Payer: BLUE CROSS/BLUE SHIELD | Admitting: Cardiology

## 2015-05-12 VITALS — BP 138/72 | HR 55 | Ht 63.0 in | Wt 106.4 lb

## 2015-05-12 DIAGNOSIS — R002 Palpitations: Secondary | ICD-10-CM | POA: Diagnosis not present

## 2015-05-12 MED ORDER — ATENOLOL 50 MG PO TABS: 50.0000 mg | ORAL_TABLET | Freq: Two times a day (BID) | ORAL | Status: DC

## 2015-05-12 NOTE — Progress Notes (Signed)
Cardiology Office Note   Date:  05/12/2015   ID:  Courteney, Alderete 1946/02/02, MRN [id number]  PCP:   Melinda Crutch, MD  Cardiologist: Darlin Coco MD  No chief complaint on file.     History of Present Illness: Robin Collins is a 69 y.o. female who presents for a six-month follow-up office visit  This pleasant 69 year old woman is seen for a scheduled followup office visit. She has a past history of palpitations and a history of atypical chest pain secondary to costochondritis of the chest wall. She had a normal nuclear stress test in 2003 showing no ischemia and her ejection fraction was 65%. An echocardiogram in April 2010 showing normal systolic and diastolic function with mild aortic insufficiency and trace mitral regurgitation. Since last visit she's been doing well. She's had minimal palpitations.  Back in June she did have one episode of forceful palpitations while at work.  It lasted about 2 hours.  She did not have any of her beta blocker with her at work.  She took Xanax twice and it finally resolved after about 2 hours.  She has not had any significant recurrence.  She has not yet decided about whether to retire at the end of this year or not.  They are staying very busy at work.  She is in the IT department.  Past Medical History  Diagnosis Date  . Palpitations   . Cataract   . Heart murmur   . Dysrhythmia     "palpitations"- occ.now-not frequent  . Multiple thyroid nodules     sees Dr. Conley Rolls every 6 months  . Depression     mild  . Costochondritis     occ. flares, last 3-4 weeks ago.  . Cancer ([place])     basal cell    Past Surgical History  Procedure Laterality Date  . Cosmetic surgery      "facial"  . Eye surgery    . Excisional hemorrhoidectomy    . Cataract extraction, bilateral    . Tubal ligation    . Dilation and curettage of uterus    . Colonoscopy w/ polypectomy      last 12'15  . Esophagogastroduodenoscopy (egd) with propofol N/A  01/20/2015    Procedure: ESOPHAGOGASTRODUODENOSCOPY (EGD) WITH PROPOFOL;  Surgeon: Lafayette Dragon, MD;  Location: [facility];  Service: Endoscopy;  Laterality: N/A;  . Savory dilation N/A 01/20/2015    Procedure: SAVORY DILATION;  Surgeon: Lafayette Dragon, MD;  Location: [facility];  Service: Endoscopy;  Laterality: N/A;     Current Outpatient Prescriptions  Medication Sig Dispense Refill  . ALPRAZolam (XANAX) 0.25 MG tablet Take 0.25 mg by mouth daily as needed for anxiety (anxiety/irregular heart beat).    . Alum & Mag Hydroxide-Simeth (MAGIC MOUTHWASH) SOLN Take 5 mLs by mouth 3 (three) times daily. 250 mL 0  . aspirin 81 MG tablet Take 81 mg by mouth daily.      Marland Kitchen atenolol (TENORMIN) 50 MG tablet Take 1 tablet (50 mg total) by mouth 2 (two) times daily. 180 tablet 3  . azithromycin (ZITHROMAX) 250 MG tablet Take 250 mg by mouth. Take 500mg  by mouth once daily on Day 1, then 250mg  by mouth once daily days 2-5    . benzonatate (TESSALON) 100 MG capsule Take 100 mg by mouth 3 (three) times daily as needed (cough).   0  . busPIRone (BUSPAR) 10 MG tablet TAKE 1 BY MOUTH DAILY (FUTURE REFILLS NEED TO BE  OBTAINED BY PCP) 90 tablet 0  . busPIRone (BUSPAR) 10 MG tablet take 1 tablet by mouth once daily 90 tablet 3  . calcium carbonate 200 MG capsule Take 1,800 mg by mouth daily.     . cholecalciferol (VITAMIN D) 1000 UNITS tablet Take 1,000 Units by mouth daily.      . Multiple Vitamin (MULTIVITAMIN) tablet Take 1 tablet by mouth daily.       No current facility-administered medications for this visit.    Allergies:   Augmentin and Vioxx    Social History:  The patient  reports that she has quit smoking. Her smoking use included Cigarettes. She has never used smokeless tobacco. She reports that she drinks alcohol. She reports that she does not use illicit drugs.   Family History:  The patient's family history includes Cancer in her brother and father; Diabetes in her brother, brother, mother,  sister, and sister; Kidney disease in her father.    ROS:  Please see the history of present illness.   Otherwise, review of systems are positive for none.   All other systems are reviewed and negative.    PHYSICAL EXAM: VS:  BP 138/72 mmHg  Pulse 55  Ht 5\' 3"  (1.6 m)  Wt 106 lb 6.4 oz (48.263 kg)  BMI 18.85 kg/m2 , BMI Body mass index is 18.85 kg/(m^2). GEN: Well nourished, well developed, in no acute distress HEENT: normal Neck: no JVD, carotid bruits, or masses Cardiac: RRR; no murmurs, rubs, or gallops,no edema  Respiratory:  clear to auscultation bilaterally, normal work of breathing GI: soft, nontender, nondistended, + BS MS: no deformity or atrophy Skin: warm and dry, no rash Neuro:  Strength and sensation are intact Psych: euthymic mood, full affect   EKG:  EKG is ordered today. The ekg ordered today demonstrates sinus bradycardia at 55 bpm.  Otherwise normal.   Recent Labs: No results found for requested labs within last 365 days.    Lipid Panel No results found for: CHOL, TRIG, HDL, CHOLHDL, VLDL, LDLCALC, LDLDIRECT    Wt Readings from Last 3 Encounters:  05/12/15 106 lb 6.4 oz (48.263 kg)  01/20/15 105 lb (47.628 kg)  09/27/14 108 lb 12.8 oz (49.351 kg)      Other studies Reviewed: Additional studies/ records that were reviewed today include: . Review of the above records demonstrates:    ASSESSMENT AND PLAN:  1. history of palpitations 2. history of mitral valve prolapse 3. history of chest pain secondary to costochondritis,   Current medicines are reviewed at length with the patient today.  The patient does not have concerns regarding medicines.  The following changes have been made:  no change  Labs/ tests ordered today include:   Orders Placed This Encounter  Procedures  . EKG 12-Lead      Disposition: The patient will continue current medication.  She will keep a small supply of beta blocker on hand at work in case she has another  episode of severe palpitations.  Recheck in 6 months for office visit and EKG  Signed, Darlin Coco MD 05/12/2015 5:12 PM    [address], [place], [place]  [address] Phone: [phone]; Fax: [phone]

## 2015-05-12 NOTE — Patient Instructions (Signed)
Medication Instructions:  Your physician recommends that you continue on your current medications as directed. Please refer to the Current Medication list given to you today.  Labwork: none  Testing/Procedures: none  Follow-Up: Your physician recommends that you schedule a follow-up appointment in: 6 month ov/ekg with Tera Helper NP or Brynda Rim PA

## 2015-07-08 ENCOUNTER — Other Ambulatory Visit: Payer: Self-pay | Admitting: Cardiology

## 2015-07-08 DIAGNOSIS — F419 Anxiety disorder, unspecified: Secondary | ICD-10-CM

## 2015-10-27 ENCOUNTER — Other Ambulatory Visit: Payer: Self-pay | Admitting: Obstetrics and Gynecology

## 2015-10-27 DIAGNOSIS — R928 Other abnormal and inconclusive findings on diagnostic imaging of breast: Secondary | ICD-10-CM

## 2015-11-03 ENCOUNTER — Ambulatory Visit
Admission: RE | Admit: 2015-11-03 | Discharge: 2015-11-03 | Disposition: A | Discharge status: Home / Self Care | Payer: BLUE CROSS/BLUE SHIELD | Source: Ambulatory Visit | Attending: Obstetrics and Gynecology | Admitting: Obstetrics and Gynecology

## 2015-11-03 DIAGNOSIS — R928 Other abnormal and inconclusive findings on diagnostic imaging of breast: Secondary | ICD-10-CM | POA: Diagnosis not present

## 2015-11-10 ENCOUNTER — Ambulatory Visit: Payer: BLUE CROSS/BLUE SHIELD | Admitting: Physician Assistant

## 2015-11-15 NOTE — Progress Notes (Signed)
Cardiology Office Note    Date:  11/16/2015   ID:  Robin Collins, DOB 1946/04/17, MRN [id number]  PCP:   Melinda Crutch, MD  Cardiologist:  Dr. Mare Ferrari --> Dr. Gillian Shields  6 month follow up  History of Present Illness:  Robin Collins is a 70 y.o. female with a history of palpitations, atypical chest pain felt to be costochondritis, MVP and mild aortic insufficiency who presents to clinic for follow-up.  She has a past history of palpitations and a history of atypical chest pain secondary to costochondritis of the chest wall. She had a normal nuclear stress test in 2003 showing no ischemia and her ejection fraction was 65%. An echocardiogram in April 2010 showing normal systolic and diastolic function with mild aortic insufficiency and trace mitral regurgitation.   She was last seen by Dr. Mare Ferrari in 04/2015 and felt to be doing well from a cardiology standpoint.  Today she presents to clinic for follow-up. Since last visit, she has had two or three episodes of palpitations. They last about 2 hours. During these episodes she took a xanax and this helped. She is still working and does have some stress at work. She is planning to quit in June. No CP or SOB. No LE edema, orthopnea, PND. No dizziness or syncope. She walks regularly 2x week. No exertional sx.   Past Medical History  Diagnosis Date  . Palpitations   . Cataract   . Heart murmur   . Dysrhythmia     "palpitations"- occ.now-not frequent  . Multiple thyroid nodules     sees Dr. Conley Rolls every 6 months  . Depression     mild  . Costochondritis     occ. flares, last 3-4 weeks ago.  . Cancer ([place])     basal cell    Past Surgical History  Procedure Laterality Date  . Cosmetic surgery      "facial"  . Eye surgery    . Excisional hemorrhoidectomy    . Cataract extraction, bilateral    . Tubal ligation    . Dilation and curettage of uterus    . Colonoscopy w/ polypectomy      last 12'15  . Esophagogastroduodenoscopy  (egd) with propofol N/A 01/20/2015    Procedure: ESOPHAGOGASTRODUODENOSCOPY (EGD) WITH PROPOFOL;  Surgeon: Lafayette Dragon, MD;  [facility]: [facility];  Service: Endoscopy;  Laterality: N/A;  . Savory dilation N/A 01/20/2015    Procedure: SAVORY DILATION;  Surgeon: Lafayette Dragon, MD;  Location: [facility];  Service: Endoscopy;  Laterality: N/A;    Current Medications: Outpatient Prescriptions Prior to Visit  Medication Sig Dispense Refill  . ALPRAZolam (XANAX) 0.25 MG tablet Take 1 tablet (0.25 mg total) by mouth daily as needed. ADDITIONAL REFILLS FROM PRIMARY CARE DOCTOR 30 tablet 3  . aspirin 81 MG tablet Take 81 mg by mouth daily.      Marland Kitchen atenolol (TENORMIN) 50 MG tablet Take 1 tablet (50 mg total) by mouth 2 (two) times daily. 180 tablet 3  . busPIRone (BUSPAR) 10 MG tablet TAKE 1 BY MOUTH DAILY (FUTURE REFILLS NEED TO BE OBTAINED BY PCP) 90 tablet 0  . busPIRone (BUSPAR) 10 MG tablet take 1 tablet by mouth once daily 90 tablet 3  . calcium carbonate 200 MG capsule Take 1,800 mg by mouth daily.     . cholecalciferol (VITAMIN D) 1000 UNITS tablet Take 1,000 Units by mouth daily.      . Multiple Vitamin (MULTIVITAMIN) tablet Take 1 tablet by  mouth daily.      . Alum & Mag Hydroxide-Simeth (MAGIC MOUTHWASH) SOLN Take 5 mLs by mouth 3 (three) times daily. 250 mL 0  . azithromycin (ZITHROMAX) 250 MG tablet Take 250 mg by mouth. Take 500mg  by mouth once daily on Day 1, then 250mg  by mouth once daily days 2-5    . benzonatate (TESSALON) 100 MG capsule Take 100 mg by mouth 3 (three) times daily as needed (cough).   0   No facility-administered medications prior to visit.     Allergies:   Augmentin and Vioxx   Social History   Social History  . Marital Status: Married    Spouse Name: N/A  . Number of Children: N/A  . Years of Education: N/A   Social History Main Topics  . Smoking status: Former Smoker    Types: Cigarettes  . Smokeless tobacco: Never Used     Comment: social smoker -no  use in 30 yrs  . Alcohol Use: Yes     Comment: 2-3 times monthly shots of liquor/mixed drinks  . Drug Use: No  . Sexual Activity: Not Asked   Other Topics Concern  . None   Social History Narrative     Family History:  The patient's family history includes Cancer in her brother and father; Diabetes in her brother, brother, mother, sister, and sister; Kidney disease in her father.   ROS:   Please see the history of present illness.    ROS All other systems reviewed and are negative.   PHYSICAL EXAM:   VS:  BP 130/78 mmHg  Pulse 56  Ht 5\' 3"  (1.6 m)  Wt 106 lb (48.081 kg)  BMI 18.78 kg/m2   GEN: Well nourished, well developed, in no acute distress HEENT: normal Neck: no JVD, carotid bruits, or masses Cardiac: RRR; no murmurs, rubs, or gallops,no edema  Respiratory:  clear to auscultation bilaterally, normal work of breathing GI: soft, nontender, nondistended, + BS MS: no deformity or atrophy Skin: warm and dry, no rash Neuro:  Alert and Oriented x 3, Strength and sensation are intact Psych: euthymic mood, full affect  Wt Readings from Last 3 Encounters:  11/16/15 106 lb (48.081 kg)  05/12/15 106 lb 6.4 oz (48.263 kg)  01/20/15 105 lb (47.628 kg)      Studies/Labs Reviewed:   EKG:  EKG is ordered today.  The ekg ordered today demonstrates sinus bradycardia HR 57 Recent Labs: No results found for requested labs within last 365 days.   Lipid Panel No results found for: CHOL, TRIG, HDL, CHOLHDL, VLDL, LDLCALC, LDLDIRECT  Additional studies/ records that were reviewed today include:  See HPI    ASSESSMENT:    1. History of palpitations   2. Mitral valve prolapse   3. History of chest pain   4. Aortic regurgitation      PLAN:  In order of problems listed above:  Hx of palpitations: Quiescent on a beta blocker and xanax  History of MVP: stable  History of chest pain felt to be 2/2 costochondritis: stable.   Hx of mild AI: No murmur on exam. She's not  had an echo in quite some time. Will update this.   Medication Adjustments/Labs and Tests Ordered: Current medicines are reviewed at length with the patient today.  Concerns regarding medicines are outlined above.  Medication changes, Labs and Tests ordered today are listed in the Patient Instructions below. There are no Patient Instructions on file for this visit.   Signed, [place],  Thereasa Parkin  11/16/2015 3:27 PM    [facility] [place], [place], [place]  [address] Phone: [phone]; Fax: [phone]

## 2015-11-16 ENCOUNTER — Ambulatory Visit (INDEPENDENT_AMBULATORY_CARE_PROVIDER_SITE_OTHER): Payer: BLUE CROSS/BLUE SHIELD | Admitting: Physician Assistant

## 2015-11-16 ENCOUNTER — Encounter: Payer: Self-pay | Admitting: Physician Assistant

## 2015-11-16 VITALS — BP 130/78 | HR 56 | Ht 63.0 in | Wt 106.0 lb

## 2015-11-16 DIAGNOSIS — I351 Nonrheumatic aortic (valve) insufficiency: Secondary | ICD-10-CM | POA: Diagnosis not present

## 2015-11-16 DIAGNOSIS — Z8679 Personal history of other diseases of the circulatory system: Secondary | ICD-10-CM

## 2015-11-16 DIAGNOSIS — Z87898 Personal history of other specified conditions: Secondary | ICD-10-CM

## 2015-11-16 DIAGNOSIS — I341 Nonrheumatic mitral (valve) prolapse: Secondary | ICD-10-CM | POA: Diagnosis not present

## 2015-11-16 NOTE — Patient Instructions (Addendum)
Medication Instructions:  Your physician recommends that you continue on your current medications as directed. Please refer to the Current Medication list given to you today.   Labwork: NONE ORDERED  Testing/Procedures: Your physician has requested that you have an echocardiogram. Echocardiography is a painless test that uses sound waves to create images of your heart. It provides your doctor with information about the size and shape of your heart and how well your heart's chambers and valves are working. This procedure takes approximately one hour. There are no restrictions for this procedure.    Follow-Up: Your physician wants you to follow-up in: 6 MONTHS WITH DR. Marlou Porch You will receive a reminder letter in the mail two months in advance. If you don't receive a letter, please call our office to schedule the follow-up appointment.   Any Other Special Instructions Will Be Listed Below (If Applicable). Echocardiogram An echocardiogram, or echocardiography, uses sound waves (ultrasound) to produce an image of your heart. The echocardiogram is simple, painless, obtained within a short period of time, and offers valuable information to your health care provider. The images from an echocardiogram can provide information such as:  Evidence of coronary artery disease (CAD).  Heart size.  Heart muscle function.  Heart valve function.  Aneurysm detection.  Evidence of a past heart attack.  Fluid buildup around the heart.  Heart muscle thickening.  Assess heart valve function. LET [facility] CARE PROVIDER KNOW ABOUT:  Any allergies you have.  All medicines you are taking, including vitamins, herbs, eye drops, creams, and over-the-counter medicines.  Previous problems you or members of your family have had with the use of anesthetics.  Any blood disorders you have.  Previous surgeries you have had.  Medical conditions you have.  Possibility of pregnancy, if this  applies. BEFORE THE PROCEDURE  No special preparation is needed. Eat and drink normally.  PROCEDURE   In order to produce an image of your heart, gel will be applied to your chest and a wand-like tool (transducer) will be moved over your chest. The gel will help transmit the sound waves from the transducer. The sound waves will harmlessly bounce off your heart to allow the heart images to be captured in real-time motion. These images will then be recorded.  You may need an IV to receive a medicine that improves the quality of the pictures. AFTER THE PROCEDURE You may return to your normal schedule including diet, activities, and medicines, unless your health care provider tells you otherwise.   This information is not intended to replace advice given to you by your health care provider. Make sure you discuss any questions you have with your health care provider.   Document Released: 07/13/2000 Document Revised: 08/06/2014 Document Reviewed: 03/23/2013 Elsevier Interactive Patient Education [employer].     If you need a refill on your cardiac medications before your next appointment, please call your pharmacy.

## 2015-11-30 ENCOUNTER — Other Ambulatory Visit: Payer: Self-pay

## 2015-11-30 ENCOUNTER — Ambulatory Visit (HOSPITAL_COMMUNITY): Payer: BLUE CROSS/BLUE SHIELD | Attending: Internal Medicine

## 2015-11-30 DIAGNOSIS — I071 Rheumatic tricuspid insufficiency: Secondary | ICD-10-CM | POA: Insufficient documentation

## 2015-11-30 DIAGNOSIS — I358 Other nonrheumatic aortic valve disorders: Secondary | ICD-10-CM | POA: Diagnosis not present

## 2015-11-30 DIAGNOSIS — I517 Cardiomegaly: Secondary | ICD-10-CM | POA: Diagnosis not present

## 2015-11-30 DIAGNOSIS — I351 Nonrheumatic aortic (valve) insufficiency: Secondary | ICD-10-CM

## 2015-12-02 ENCOUNTER — Telehealth: Payer: Self-pay | Admitting: Physician Assistant

## 2015-12-02 NOTE — Telephone Encounter (Signed)
Returned pts call.  She has been made aware of her ECHO results. Pt verbalized understanding.

## 2015-12-02 NOTE — Telephone Encounter (Signed)
Follow-up    Patient is returning the phone left by [facility] yesterday pt needs to call returned on pt cell phone provided, regarding test from the other about Echo.

## 2015-12-02 NOTE — Telephone Encounter (Signed)
-----   Message from Eileen Stanford, PA-C sent at 11/30/2015  8:11 PM EDT ----- Still mild Aortic regurgitaiton. ECHO otherwise looks good.

## 2016-03-07 DIAGNOSIS — D225 Melanocytic nevi of trunk: Secondary | ICD-10-CM | POA: Diagnosis not present

## 2016-03-07 DIAGNOSIS — L821 Other seborrheic keratosis: Secondary | ICD-10-CM | POA: Diagnosis not present

## 2016-03-07 DIAGNOSIS — D2262 Melanocytic nevi of left upper limb, including shoulder: Secondary | ICD-10-CM | POA: Diagnosis not present

## 2016-03-07 DIAGNOSIS — L570 Actinic keratosis: Secondary | ICD-10-CM | POA: Diagnosis not present

## 2016-03-07 DIAGNOSIS — Z85828 Personal history of other malignant neoplasm of skin: Secondary | ICD-10-CM | POA: Diagnosis not present

## 2016-03-15 DIAGNOSIS — H26491 Other secondary cataract, right eye: Secondary | ICD-10-CM | POA: Diagnosis not present

## 2016-03-15 DIAGNOSIS — H04123 Dry eye syndrome of bilateral lacrimal glands: Secondary | ICD-10-CM | POA: Diagnosis not present

## 2016-03-15 DIAGNOSIS — H43813 Vitreous degeneration, bilateral: Secondary | ICD-10-CM | POA: Diagnosis not present

## 2016-03-15 DIAGNOSIS — Z961 Presence of intraocular lens: Secondary | ICD-10-CM | POA: Diagnosis not present

## 2016-03-28 DIAGNOSIS — E042 Nontoxic multinodular goiter: Secondary | ICD-10-CM | POA: Diagnosis not present

## 2016-03-28 DIAGNOSIS — E7800 Pure hypercholesterolemia, unspecified: Secondary | ICD-10-CM | POA: Diagnosis not present

## 2016-03-28 DIAGNOSIS — D696 Thrombocytopenia, unspecified: Secondary | ICD-10-CM | POA: Diagnosis not present

## 2016-04-05 DIAGNOSIS — E7800 Pure hypercholesterolemia, unspecified: Secondary | ICD-10-CM | POA: Diagnosis not present

## 2016-04-05 DIAGNOSIS — Z23 Encounter for immunization: Secondary | ICD-10-CM | POA: Diagnosis not present

## 2016-04-05 DIAGNOSIS — E042 Nontoxic multinodular goiter: Secondary | ICD-10-CM | POA: Diagnosis not present

## 2016-04-05 DIAGNOSIS — R002 Palpitations: Secondary | ICD-10-CM | POA: Diagnosis not present

## 2016-04-05 DIAGNOSIS — D696 Thrombocytopenia, unspecified: Secondary | ICD-10-CM | POA: Diagnosis not present

## 2016-04-10 ENCOUNTER — Emergency Department (HOSPITAL_COMMUNITY)
Admission: EM | Admit: 2016-04-10 | Discharge: 2016-04-10 | Disposition: A | Discharge status: Home / Self Care | Payer: BLUE CROSS/BLUE SHIELD | Attending: Emergency Medicine | Admitting: Emergency Medicine

## 2016-04-10 ENCOUNTER — Encounter (HOSPITAL_COMMUNITY): Payer: Self-pay | Admitting: Emergency Medicine

## 2016-04-10 ENCOUNTER — Emergency Department (HOSPITAL_COMMUNITY): Payer: BLUE CROSS/BLUE SHIELD

## 2016-04-10 DIAGNOSIS — Z79899 Other long term (current) drug therapy: Secondary | ICD-10-CM | POA: Diagnosis not present

## 2016-04-10 DIAGNOSIS — Z87891 Personal history of nicotine dependence: Secondary | ICD-10-CM | POA: Diagnosis not present

## 2016-04-10 DIAGNOSIS — R200 Anesthesia of skin: Secondary | ICD-10-CM | POA: Diagnosis not present

## 2016-04-10 DIAGNOSIS — Z7982 Long term (current) use of aspirin: Secondary | ICD-10-CM | POA: Insufficient documentation

## 2016-04-10 DIAGNOSIS — Z85828 Personal history of other malignant neoplasm of skin: Secondary | ICD-10-CM | POA: Insufficient documentation

## 2016-04-10 NOTE — ED Provider Notes (Signed)
[facility] DEPT Provider Note   CSN: [id number] Arrival date & time: 04/10/16  1033     History   Chief Complaint Chief Complaint  Patient presents with  . Numbness    HPI Robin Collins is a 70 y.o. female.  Patient is a 70 year old female with past medical history of paroxysmal A. fib, costochondritis. She presents for evaluation of arm numbness. She reports a 10-15 second episode of right arm numbness that occurred just over one week ago. This resolved spontaneously. She had no other symptoms during this episode other than the sensation of numbness throughout her entire arm from her shoulder through her hand. While she was on the computer this morning, she experienced similar symptoms in her left arm. She reports numbness from her shoulder through her hand. This lasted for approximately 10-15 seconds, then resolved spontaneously she has no complaints at present. She denies any headache, visual changes. She denies any neck pain.   The history is provided by the patient.    Past Medical History:  Diagnosis Date  . Cancer ([place])    basal cell  . Cataract   . Costochondritis    occ. flares, last 3-4 weeks ago.  . Depression    mild  . Dysrhythmia    "palpitations"- occ.now-not frequent  . Heart murmur   . Multiple thyroid nodules    sees Dr. Conley Rolls every 6 months  . Palpitations     Patient Active Problem List   Diagnosis Date Noted  . Dysphagia, pharyngoesophageal phase 01/20/2015  . Costochondritis 06/29/2011  . Temporomandibular joint-pain-dysfunction syndrome (TMJ) 11/16/2010  . COLONIC POLYPS, ADENOMATOUS 09/22/2007  . THYROID DISORDER 09/22/2007  . MITRAL VALVE PROLAPSE 09/22/2007  . Palpitations 09/22/2007  . INTERNAL HEMORRHOIDS 07/02/2003    Past Surgical History:  Procedure Laterality Date  . CATARACT EXTRACTION, BILATERAL    . COLONOSCOPY W/ POLYPECTOMY     last 12'15  . COSMETIC SURGERY     "facial"  . DILATION AND CURETTAGE OF UTERUS      . ESOPHAGOGASTRODUODENOSCOPY (EGD) WITH PROPOFOL N/A 01/20/2015   Procedure: ESOPHAGOGASTRODUODENOSCOPY (EGD) WITH PROPOFOL;  Surgeon: Lafayette Dragon, MD;  Location: [facility];  Service: Endoscopy;  Laterality: N/A;  . EXCISIONAL HEMORRHOIDECTOMY    . EYE SURGERY    . SAVORY DILATION N/A 01/20/2015   Procedure: SAVORY DILATION;  Surgeon: Lafayette Dragon, MD;  Location: [facility];  Service: Endoscopy;  Laterality: N/A;  . TUBAL LIGATION      OB History    No data available       Home Medications    Prior to Admission medications   Medication Sig Start Date End Date Taking? Authorizing Provider  ALPRAZolam (XANAX) 0.25 MG tablet Take 1 tablet (0.25 mg total) by mouth daily as needed. ADDITIONAL REFILLS FROM PRIMARY CARE DOCTOR 07/09/15  Yes Darlin Coco, MD  aspirin 81 MG tablet Take 81 mg by mouth daily.     Yes Historical Provider, MD  atenolol (TENORMIN) 50 MG tablet Take 1 tablet (50 mg total) by mouth 2 (two) times daily. 05/12/15  Yes Darlin Coco, MD  busPIRone (BUSPAR) 10 MG tablet take 1 tablet by mouth once daily 09/28/14  Yes Darlin Coco, MD  calcium carbonate 200 MG capsule Take 1,800 mg by mouth daily.    Yes Historical Provider, MD  cholecalciferol (VITAMIN D) 1000 UNITS tablet Take 1,000 Units by mouth daily.     Yes Historical Provider, MD  Multiple Vitamin (MULTIVITAMIN) tablet Take 1 tablet by  mouth daily.     Yes Historical Provider, MD  busPIRone (BUSPAR) 10 MG tablet TAKE 1 BY MOUTH DAILY (FUTURE REFILLS NEED TO BE OBTAINED BY PCP) Patient not taking: Reported on 04/10/2016 03/23/14   Darlin Coco, MD    Family History Family History  Problem Relation Age of Onset  . Kidney disease Father   . Cancer Father   . Diabetes Mother   . Diabetes Sister   . Diabetes Brother   . Cancer Brother   . Diabetes Sister   . Diabetes Brother     Social History Social History  Substance Use Topics  . Smoking status: Former Smoker    Types: Cigarettes   . Smokeless tobacco: Never Used     Comment: social smoker -no use in 30 yrs  . Alcohol use Yes     Comment: 2-3 times monthly shots of liquor/mixed drinks     Allergies   Augmentin [amoxicillin-pot clavulanate] and Vioxx [rofecoxib]   Review of Systems Review of Systems  All other systems reviewed and are negative.    Physical Exam Updated Vital Signs BP 144/71   Pulse (!) 55   Temp 97.7 F (36.5 C) (Oral)   Resp 17   SpO2 100%   Physical Exam  Constitutional: She is oriented to person, place, and time. She appears well-developed and well-nourished. No distress.  HENT:  Head: Normocephalic and atraumatic.  Eyes: EOM are normal. Pupils are equal, round, and reactive to light.  Neck: Normal range of motion. Neck supple.  Cardiovascular: Normal rate and regular rhythm.  Exam reveals no gallop and no friction rub.   No murmur heard. Pulmonary/Chest: Effort normal and breath sounds normal. No respiratory distress. She has no wheezes.  Abdominal: Soft. Bowel sounds are normal. She exhibits no distension. There is no tenderness.  Musculoskeletal: Normal range of motion.  Neurological: She is alert and oriented to person, place, and time. No cranial nerve deficit. She exhibits normal muscle tone. Coordination normal.  Skin: Skin is warm and dry. She is not diaphoretic.  Nursing note and vitals reviewed.    ED Treatments / Results  Labs (all labs ordered are listed, but only abnormal results are displayed) Labs Reviewed - No data to display  EKG  EKG Interpretation None       Radiology No results found.  Procedures Procedures (including critical care time)  Medications Ordered in ED Medications - No data to display   Initial Impression / Assessment and Plan / ED Course  I have reviewed the triage vital signs and the nursing notes.  Pertinent labs & imaging results that were available during my care of the patient were reviewed by me and considered in my  medical decision making (see chart for details).  Clinical Course    Patient presents with 2 episodes of numbness. Each lasted approximately 10 seconds and occurred once in each upper extremity. Her neurologic exam is nonfocal, x-rays showed mild degenerative disc disease, but no other acute process. I am uncertain as whether the symptoms were caused by carpal tunnel, a pinched nerve in her neck or shoulder, but I am quite certain this is not an acute ischemic event. At this point, I see no reason for further workup and believe she is appropriate for discharge. I've advised her to follow-up with her doctor in the next week, and return to the ER if her symptoms significantly worsen or change.  Final Clinical Impressions(s) / ED Diagnoses   Final diagnoses:  None  New Prescriptions New Prescriptions   No medications on file     Veryl Speak, MD 04/10/16 [phone]

## 2016-04-10 NOTE — ED Notes (Signed)
Patient transported to X-ray 

## 2016-04-10 NOTE — ED Triage Notes (Signed)
Pt from home with c/o left arm numbness starting in her shoulder radiating down to her hand lasting approx 10-15 seconds.  Pt reports having the same occur on the right arm this past Sunday for the same length of time but was unable to move her arm at all at the time.  No neuro deficits at this time.  Pt additionally reports hx of afib.  NAD, A&o.

## 2016-04-10 NOTE — Discharge Instructions (Signed)
Follow-up with your primary Dr. if your symptoms persist, and return to the ER if your symptoms worsen or change.

## 2016-04-10 NOTE — ED Notes (Signed)
Pt stated that she is having some tingling in the finger, but stated she is still able to move them.

## 2016-05-20 NOTE — Progress Notes (Signed)
Cardiology Office Note    Date:  05/21/2016   ID:  Robin Collins, Robin Collins August 24, 1945, MRN [id number]  PCP:  Melinda Crutch, MD  Cardiologist:   Candee Furbish, MD     History of Present Illness:  Robin Collins is a 70 y.o. female former patient of Dr. Mare Ferrari here follow up with  history of palpitations, atypical chest pain felt to be costochondritis, MVP and mild aortic insufficiency.  She has a past history of palpitations and a history of atypical chest pain secondary to costochondritis of the chest wall. She had a normal nuclear stress test in 2003 showing no ischemia and her ejection fraction was 65%. An echocardiogram in April 2010 showing normal systolic and diastolic function with mild aortic insufficiency and trace mitral regurgitation.  Rare xanax for palpitations. Stress at work previously. Says she had an episode of atrial fibrillation. Watering plants and right arm went numb. 10 seconds. Left shoulder to left arm, When sitting at the computer. ER visit on 04/10/16 noted. Thought that it may be compressed nerve. She is not having further episodes.  She did mention to them however that she had episodes of racing slight irregularity of her heart that she described as atrial fibrillation. Her sister has atrial fibrillation and is on anticoagulation. She has never had any confirmatory documentation of atrial fibrillation.  Past Medical History:  Diagnosis Date  . Cancer ([place])    basal cell  . Cataract   . Costochondritis    occ. flares, last 3-4 weeks ago.  . Depression    mild  . Dysrhythmia    "palpitations"- occ.now-not frequent  . Heart murmur   . Multiple thyroid nodules    sees Dr. Conley Rolls every 6 months  . Palpitations     Past Surgical History:  Procedure Laterality Date  . CATARACT EXTRACTION, BILATERAL    . COLONOSCOPY W/ POLYPECTOMY     last 12'15  . COSMETIC SURGERY     "facial"  . DILATION AND CURETTAGE OF UTERUS    . ESOPHAGOGASTRODUODENOSCOPY (EGD)  WITH PROPOFOL N/A 01/20/2015   Procedure: ESOPHAGOGASTRODUODENOSCOPY (EGD) WITH PROPOFOL;  Surgeon: Lafayette Dragon, MD;  Location: [facility];  Service: Endoscopy;  Laterality: N/A;  . EXCISIONAL HEMORRHOIDECTOMY    . EYE SURGERY    . SAVORY DILATION N/A 01/20/2015   Procedure: SAVORY DILATION;  Surgeon: Lafayette Dragon, MD;  Location: [facility];  Service: Endoscopy;  Laterality: N/A;  . TUBAL LIGATION      Current Medications: Outpatient Medications Prior to Visit  Medication Sig Dispense Refill  . ALPRAZolam (XANAX) 0.25 MG tablet Take 1 tablet (0.25 mg total) by mouth daily as needed. ADDITIONAL REFILLS FROM PRIMARY CARE DOCTOR 30 tablet 3  . aspirin 81 MG tablet Take 81 mg by mouth daily.      . busPIRone (BUSPAR) 10 MG tablet take 1 tablet by mouth once daily 90 tablet 3  . calcium carbonate 200 MG capsule Take 1,800 mg by mouth daily.     . cholecalciferol (VITAMIN D) 1000 UNITS tablet Take 1,000 Units by mouth daily.      . Multiple Vitamin (MULTIVITAMIN) tablet Take 1 tablet by mouth daily.      Marland Kitchen atenolol (TENORMIN) 50 MG tablet Take 1 tablet (50 mg total) by mouth 2 (two) times daily. 180 tablet 3  . busPIRone (BUSPAR) 10 MG tablet TAKE 1 BY MOUTH DAILY (FUTURE REFILLS NEED TO BE OBTAINED BY PCP) (Patient not taking: Reported on 04/10/2016) 90  tablet 0   No facility-administered medications prior to visit.      Allergies:   Augmentin [amoxicillin-pot clavulanate] and Vioxx [rofecoxib]   Social History   Social History  . Marital status: Married    Spouse name: N/A  . Number of children: N/A  . Years of education: N/A   Social History Main Topics  . Smoking status: Former Smoker    Types: Cigarettes  . Smokeless tobacco: Never Used     Comment: social smoker -no use in 30 yrs  . Alcohol use Yes     Comment: 2-3 times monthly shots of liquor/mixed drinks  . Drug use: No  . Sexual activity: Not Asked   Other Topics Concern  . None   Social History Narrative  .  None     Family History:  The patient's family history includes Cancer in her brother and father; Diabetes in her brother, brother, mother, sister, and sister; Kidney disease in her father.   ROS:   Please see the history of present illness.    ROS All other systems reviewed and are negative.   PHYSICAL EXAM:   VS:  BP 118/72   Pulse (!) 58   Ht 5\' 3"  (1.6 m)   Wt 107 lb (48.5 kg)   BMI 18.95 kg/m    GEN: Well nourished, well developed, in no acute distress  HEENT: normal  Neck: no JVD, carotid bruits, or masses Cardiac: RRR; no murmurs, rubs, or gallops,no edema  Respiratory:  clear to auscultation bilaterally, normal work of breathing GI: soft, nontender, nondistended, + BS MS: no deformity or atrophy  Skin: warm and dry, no rash Neuro:  Alert and Oriented x 3, Strength and sensation are intact Psych: euthymic mood, full affect  Wt Readings from Last 3 Encounters:  05/21/16 107 lb (48.5 kg)  11/16/15 106 lb (48.1 kg)  05/12/15 106 lb 6.4 oz (48.3 kg)      Studies/Labs Reviewed:   EKG:  EKG is not ordered today.   Recent Labs: No results found for requested labs within last 8760 hours.   Lipid Panel No results found for: CHOL, TRIG, HDL, CHOLHDL, VLDL, LDLCALC, LDLDIRECT  Additional studies/ records that were reviewed today include:   ECHO 11/30/15: - Left ventricle: The cavity size was normal. Wall thickness was   increased in a pattern of mild LVH. Systolic function was normal.   The estimated ejection fraction was in the range of 60% to 65%.   Wall motion was normal; there were no regional wall motion   abnormalities. Left ventricular diastolic function parameters   were normal. - Aortic valve: Sclerosis without stenosis. There was mild   regurgitation. - Left atrium: The atrium was normal in size. - Tricuspid valve: There was trivial regurgitation. - Pulmonary arteries: PA peak pressure: 26 mm Hg (S). - Inferior vena cava: The vessel was normal in size.  The   respirophasic diameter changes were in the normal range (= 50%),   consistent with normal central venous pressure.  Impressions:  - LVEF 60-65%, mild LVH, normal wall motion, normal diastolic   function, normal LA size, aortic sclerosis with mild AI, trivial   TR, normal RVSP, normal IVC.    ASSESSMENT:    1. History of palpitations   2. Mitral valve prolapse   3. History of chest pain   4. Aortic regurgitation       PLAN:  In order of problems listed above:  Hx of palpitations: Quiescent on a  beta blocker and xanax. She mentions that she thinks that she has atrial fibrillation. This is never been officially documented. I will check a 30 day event monitor. I expressed to her the implications of atrial fibrillation, i.e. anticoagulation. We will follow-up with event monitor results.  History of MVP: stable. None reported on echo.  History of chest pain felt to be 2/2 costochondritis: stable.   Hx of mild AI: No murmur on exam. 11/2015 ECHO reassuring. Mild AI.  Bilateral arm numbness-see emergency room notes for details. Right arm when watering plant transiently 10 seconds. Left arm when sitting at computer. May have been nerve compression. I stated that this would be a good idea for her to discuss this with Dr. Harrington Challenger. She has not done this up to this date.   Medication Adjustments/Labs and Tests Ordered: Current medicines are reviewed at length with the patient today.  Concerns regarding medicines are outlined above.  Medication changes, Labs and Tests ordered today are listed in the Patient Instructions below. Patient Instructions  Medication Instructions:  The current medical regimen is effective;  continue present plan and medications.  Testing/Procedures: Your physician has recommended that you wear an event monitor in 30 days. Event monitors are medical devices that record the heart's electrical activity. Doctors most often [place] these monitors to diagnose  arrhythmias. Arrhythmias are problems with the speed or rhythm of the heartbeat. The monitor is a small, portable device. You can wear one while you do your normal daily activities. This is usually used to diagnose what is causing palpitations/syncope (passing out).  Follow-Up: Follow up in 6 months with Bonney Leitz, PA.  You will receive a letter in the mail 2 months before you are due.  Please call us when you receive this letter to schedule your follow up appointment.  Follow up in 1 year with Dr. Marlou Porch.  You will receive a letter in the mail 2 months before you are due.  Please call us when you receive this letter to schedule your follow up appointment.   Thank you for choosing [facility]!!      If you need a refill on your cardiac medications before your next appointment, please call your pharmacy.      Signed, Candee Furbish, MD  05/21/2016 8:55 AM    [address], [place], [place]  [address] Phone: [phone]; Fax: [phone]

## 2016-05-21 ENCOUNTER — Ambulatory Visit (INDEPENDENT_AMBULATORY_CARE_PROVIDER_SITE_OTHER): Payer: BLUE CROSS/BLUE SHIELD | Admitting: Cardiology

## 2016-05-21 ENCOUNTER — Encounter: Payer: Self-pay | Admitting: Cardiology

## 2016-05-21 VITALS — BP 118/72 | HR 58 | Ht 63.0 in | Wt 107.0 lb

## 2016-05-21 DIAGNOSIS — R002 Palpitations: Secondary | ICD-10-CM | POA: Diagnosis not present

## 2016-05-21 DIAGNOSIS — I351 Nonrheumatic aortic (valve) insufficiency: Secondary | ICD-10-CM

## 2016-05-21 DIAGNOSIS — R202 Paresthesia of skin: Secondary | ICD-10-CM

## 2016-05-21 MED ORDER — ATENOLOL 50 MG PO TABS: 50.0000 mg | ORAL_TABLET | Freq: Two times a day (BID) | ORAL | 3 refills | Status: DC

## 2016-05-21 NOTE — Patient Instructions (Signed)
Medication Instructions:  The current medical regimen is effective;  continue present plan and medications.  Testing/Procedures: Your physician has recommended that you wear an event monitor in 30 days. Event monitors are medical devices that record the heart's electrical activity. Doctors most often [place] these monitors to diagnose arrhythmias. Arrhythmias are problems with the speed or rhythm of the heartbeat. The monitor is a small, portable device. You can wear one while you do your normal daily activities. This is usually used to diagnose what is causing palpitations/syncope (passing out).  Follow-Up: Follow up in 6 months with Bonney Leitz, PA.  You will receive a letter in the mail 2 months before you are due.  Please call us when you receive this letter to schedule your follow up appointment.  Follow up in 1 year with Dr. Marlou Porch.  You will receive a letter in the mail 2 months before you are due.  Please call us when you receive this letter to schedule your follow up appointment.   Thank you for choosing [facility]!!      If you need a refill on your cardiac medications before your next appointment, please call your pharmacy.

## 2016-05-23 ENCOUNTER — Ambulatory Visit (INDEPENDENT_AMBULATORY_CARE_PROVIDER_SITE_OTHER): Payer: BLUE CROSS/BLUE SHIELD

## 2016-05-23 DIAGNOSIS — R002 Palpitations: Secondary | ICD-10-CM

## 2016-05-24 DIAGNOSIS — R200 Anesthesia of skin: Secondary | ICD-10-CM | POA: Diagnosis not present

## 2016-05-24 DIAGNOSIS — E042 Nontoxic multinodular goiter: Secondary | ICD-10-CM | POA: Diagnosis not present

## 2016-05-24 DIAGNOSIS — Z23 Encounter for immunization: Secondary | ICD-10-CM | POA: Diagnosis not present

## 2016-05-24 DIAGNOSIS — F419 Anxiety disorder, unspecified: Secondary | ICD-10-CM | POA: Diagnosis not present

## 2016-11-02 ENCOUNTER — Encounter: Payer: Self-pay | Admitting: Physician Assistant

## 2016-11-18 NOTE — Progress Notes (Signed)
Cardiology Office Note    Date:  11/20/2016   ID:  Robin Collins, Robin Collins 1945/11/07, MRN [id number]  PCP:  Melinda Crutch, MD  Cardiologist:  Dr. Marlou Porch  CC: follow up  History of Present Illness:  Robin Collins is a 71 y.o. female with a history of palpitations, atypical chest pain felt to be costochondritis, MVP and mild aortic insufficiency who presents to clinic for follow-up.  She has a past history of palpitations and a history of atypical chest pain secondary to costochondritis of the chest wall. She had a normal nuclear stress test in 2003 showing no ischemia and her ejection fraction was 65%. An echocardiogram in April 2010 showing normal systolic and diastolic function with mild aortic insufficiency and trace mitral regurgitation.   She was last seen by Dr. Marlou Porch in 04/2016 and complained of bilateral arm numbness c/w nerve compressions and palpitations. She also reported that she thought she had been diagnosed with afib in the past. A 30 day event monitor was placed which showed rare PVCs and no other abnormalities.   Today she presents to clinic for follow up. No CP or SOB. No LE edema, orthopnea or PND. No dizziness or syncope. No blood in stool or urine. No severe palpitations but does continues to feel your heart skip. She has had some stress redoing her kitchen recently. She retired last June from her long time career at [employer].  She exercises regularly by walking 3-4 miles a day with no decreased exercise tolerance, chest pain or SOB. No more numbness in her arms.      Past Medical History:  Diagnosis Date  . Cancer ([place])    basal cell  . Cataract   . Costochondritis    occ. flares, last 3-4 weeks ago.  . Depression    mild  . Dysrhythmia    "palpitations"- occ.now-not frequent  . Heart murmur   . Multiple thyroid nodules    sees Dr. Conley Rolls every 6 months  . Palpitations     Past Surgical History:  Procedure Laterality Date  . CATARACT EXTRACTION,  BILATERAL    . COLONOSCOPY W/ POLYPECTOMY     last 12'15  . COSMETIC SURGERY     "facial"  . DILATION AND CURETTAGE OF UTERUS    . ESOPHAGOGASTRODUODENOSCOPY (EGD) WITH PROPOFOL N/A 01/20/2015   Procedure: ESOPHAGOGASTRODUODENOSCOPY (EGD) WITH PROPOFOL;  Surgeon: Lafayette Dragon, MD;  Location: [facility];  Service: Endoscopy;  Laterality: N/A;  . EXCISIONAL HEMORRHOIDECTOMY    . EYE SURGERY    . SAVORY DILATION N/A 01/20/2015   Procedure: SAVORY DILATION;  Surgeon: Lafayette Dragon, MD;  Location: [facility];  Service: Endoscopy;  Laterality: N/A;  . TUBAL LIGATION      Current Medications: Outpatient Medications Prior to Visit  Medication Sig Dispense Refill  . aspirin 81 MG tablet Take 81 mg by mouth daily.      Marland Kitchen atenolol (TENORMIN) 50 MG tablet Take 1 tablet (50 mg total) by mouth 2 (two) times daily. 180 tablet 3  . busPIRone (BUSPAR) 10 MG tablet take 1 tablet by mouth once daily 90 tablet 3  . calcium carbonate 200 MG capsule Take 1,800 mg by mouth daily.     . cholecalciferol (VITAMIN D) 1000 UNITS tablet Take 1,000 Units by mouth daily.      . Multiple Vitamin (MULTIVITAMIN) tablet Take 1 tablet by mouth daily.      Marland Kitchen ALPRAZolam (XANAX) 0.25 MG tablet Take 1 tablet (0.25  mg total) by mouth daily as needed. ADDITIONAL REFILLS FROM PRIMARY CARE DOCTOR 30 tablet 3   No facility-administered medications prior to visit.      Allergies:   Augmentin [amoxicillin-pot clavulanate] and Vioxx [rofecoxib]   Social History   Social History  . Marital status: Married    Spouse name: N/A  . Number of children: N/A  . Years of education: N/A   Social History Main Topics  . Smoking status: Former Smoker    Types: Cigarettes  . Smokeless tobacco: Never Used     Comment: social smoker -no use in 30 yrs  . Alcohol use Yes     Comment: 2-3 times monthly shots of liquor/mixed drinks  . Drug use: No  . Sexual activity: Not Asked   Other Topics Concern  . None   Social History  Narrative  . None     Family History:  The patient's family history includes Cancer in her brother and father; Diabetes in her brother, brother, mother, sister, and sister; Kidney disease in her father.      ROS:   Please see the history of present illness.    ROS All other systems reviewed and are negative.   PHYSICAL EXAM:   VS:  BP 126/70   Pulse 62   Ht 5\' 3"  (1.6 m)   Wt 105 lb 12.8 oz (48 kg)   SpO2 99%   BMI 18.74 kg/m    GEN: Well nourished, well developed, in no acute distress  HEENT: normal  Neck: no JVD, carotid bruits, or masses Cardiac: RRR; no murmurs, rubs, or gallops,no edema  Respiratory:  clear to auscultation bilaterally, normal work of breathing GI: soft, nontender, nondistended, + BS MS: no deformity or atrophy  Skin: warm and dry, no rash Neuro:  Alert and Oriented x 3, Strength and sensation are intact Psych: euthymic mood, full affec    Wt Readings from Last 3 Encounters:  11/20/16 105 lb 12.8 oz (48 kg)  05/21/16 107 lb (48.5 kg)  11/16/15 106 lb (48.1 kg)      Studies/Labs Reviewed:   EKG:  EKG is ordered today.  The ekg ordered today demonstrates sinus bradycardia HR 58  Recent Labs: No results found for requested labs within last 8760 hours.   Lipid Panel No results found for: CHOL, TRIG, HDL, CHOLHDL, VLDL, LDLCALC, LDLDIRECT  Additional studies/ records that were reviewed today include:   30 day event monitor 04/2016 Study Highlights    Rare PVCs (likely cause of palpitations)  No atrial fibrillation  No significant bradycardia   Candee Furbish, MD     2D ECHO: 11/30/2015 LV EF: 60% -   65% Study Conclusions - Left ventricle: The cavity size was normal. Wall thickness was   increased in a pattern of mild LVH. Systolic function was normal.   The estimated ejection fraction was in the range of 60% to 65%.   Wall motion was normal; there were no regional wall motion   abnormalities. Left ventricular diastolic function  parameters   were normal. - Aortic valve: Sclerosis without stenosis. There was mild   regurgitation. - Left atrium: The atrium was normal in size. - Tricuspid valve: There was trivial regurgitation. - Pulmonary arteries: PA peak pressure: 26 mm Hg (S). - Inferior vena cava: The vessel was normal in size. The   respirophasic diameter changes were in the normal range (= 50%),   consistent with normal central venous pressure. Impressions: - LVEF 60-65%, mild LVH, normal  wall motion, normal diastolic   function, normal LA size, aortic sclerosis with mild AI, trivial   TR, normal RVSP, normal IVC.   ASSESSMENT & PLAN:   Palpitations: rare palpitations c/w PVCs. Well controlled on atenolol. Recent 30 day event monitor with no afib and rare PVCs.  Hx of MVP: not mentioned on most recent echo  Hx of chest pain felt to be c/w costochondritis: stable.   AI: Echo 11/2015 with mild AI. No murmur on exam.   Medication Adjustments/Labs and Tests Ordered: Current medicines are reviewed at length with the patient today.  Concerns regarding medicines are outlined above.  Medication changes, Labs and Tests ordered today are listed in the Patient Instructions below. Patient Instructions  Medication Instructions:  Your physician recommends that you continue on your current medications as directed. Please refer to the Current Medication list given to you today.   Labwork: None ordered  Testing/Procedures: None ordered  Follow-Up: Your physician wants you to follow-up in: [place] will receive a reminder letter in the mail two months in advance. If you don't receive a letter, please call our office to schedule the follow-up appointment.    Any Other Special Instructions Will Be Listed Below (If Applicable).     If you need a refill on your cardiac medications before your next appointment, please call your pharmacy.      Signed, Inetta Dicke, PA-C  11/20/2016  9:36 AM    [facility] [place], [place], [place]  [address] Phone: [phone]; Fax: [phone]

## 2016-11-20 ENCOUNTER — Encounter: Payer: Self-pay | Admitting: Physician Assistant

## 2016-11-20 ENCOUNTER — Ambulatory Visit (INDEPENDENT_AMBULATORY_CARE_PROVIDER_SITE_OTHER): Payer: BLUE CROSS/BLUE SHIELD | Admitting: Physician Assistant

## 2016-11-20 VITALS — BP 126/70 | HR 62 | Ht 63.0 in | Wt 105.8 lb

## 2016-11-20 DIAGNOSIS — R002 Palpitations: Secondary | ICD-10-CM | POA: Diagnosis not present

## 2016-11-20 DIAGNOSIS — I341 Nonrheumatic mitral (valve) prolapse: Secondary | ICD-10-CM

## 2016-11-20 DIAGNOSIS — I351 Nonrheumatic aortic (valve) insufficiency: Secondary | ICD-10-CM | POA: Diagnosis not present

## 2016-11-20 DIAGNOSIS — Z87898 Personal history of other specified conditions: Secondary | ICD-10-CM

## 2016-11-20 NOTE — Patient Instructions (Addendum)

## 2016-12-17 DIAGNOSIS — N390 Urinary tract infection, site not specified: Secondary | ICD-10-CM | POA: Diagnosis not present

## 2016-12-17 DIAGNOSIS — R350 Frequency of micturition: Secondary | ICD-10-CM | POA: Diagnosis not present

## 2017-03-05 DIAGNOSIS — D1801 Hemangioma of skin and subcutaneous tissue: Secondary | ICD-10-CM | POA: Diagnosis not present

## 2017-03-05 DIAGNOSIS — Z85828 Personal history of other malignant neoplasm of skin: Secondary | ICD-10-CM | POA: Diagnosis not present

## 2017-03-05 DIAGNOSIS — D2262 Melanocytic nevi of left upper limb, including shoulder: Secondary | ICD-10-CM | POA: Diagnosis not present

## 2017-03-05 DIAGNOSIS — L821 Other seborrheic keratosis: Secondary | ICD-10-CM | POA: Diagnosis not present

## 2017-04-03 DIAGNOSIS — H26492 Other secondary cataract, left eye: Secondary | ICD-10-CM | POA: Diagnosis not present

## 2017-04-03 DIAGNOSIS — H26491 Other secondary cataract, right eye: Secondary | ICD-10-CM | POA: Diagnosis not present

## 2017-04-03 DIAGNOSIS — Z961 Presence of intraocular lens: Secondary | ICD-10-CM | POA: Diagnosis not present

## 2017-04-03 DIAGNOSIS — H35363 Drusen (degenerative) of macula, bilateral: Secondary | ICD-10-CM | POA: Diagnosis not present

## 2017-05-26 NOTE — Progress Notes (Signed)
Cardiology Office Note    Date:  05/27/2017   ID:  Astin, Rape 1946-04-08, MRN [id number]  PCP:  [name], MD  Cardiologist:   Candee Furbish, MD     History of Present Illness:  Robin Collins is a 71 y.o. female former patient of Dr. Mare Ferrari here follow up with  history of palpitations, atypical chest pain felt to be costochondritis, MVP and mild aortic insufficiency.  She has a past history of palpitations and a history of atypical chest pain secondary to costochondritis of the chest wall. She had a normal nuclear stress test in 2003 showing no ischemia and her ejection fraction was 65%. An echocardiogram in April 2010 showing normal systolic and diastolic function with mild aortic insufficiency and trace mitral regurgitation.  Rare xanax for palpitations. Stress at work previously. Says she had an episode of atrial fibrillation. Watering plants and right arm went numb. 10 seconds. Left shoulder to left arm, When sitting at the computer. ER visit on 04/10/16 noted. Thought that it may be compressed nerve. She is not having further episodes.  She did mention to them however that she had episodes of racing slight irregularity of her heart that she described as atrial fibrillation. Her sister has atrial fibrillation and is on anticoagulation. She has never had any confirmatory documentation of atrial fibrillation.  05/27/17-at last visit reassuring event monitor with no evidence of atrial fibrillation.  PVCs only.  We discussed this today in clinic.  No syncope bleeding orthopnea PND.  She has been battling a recent cold/cough.  Past Medical History:  Diagnosis Date  . Cancer ([place])    basal cell  . Cataract   . COLONIC POLYPS, ADENOMATOUS 09/22/2007   Qualifier: Diagnosis of  By: Ronnald Ramp RN, CGRN, Sheri    . Costochondritis    occ. flares, last 3-4 weeks ago.  . Depression    mild  . Dysphagia, pharyngoesophageal phase 01/20/2015  . Dysrhythmia    "palpitations"-  occ.now-not frequent  . Heart murmur   . MITRAL VALVE PROLAPSE 09/22/2007   Qualifier: Diagnosis of  By: Ronnald Ramp RN, [employer], [place]    . Multiple thyroid nodules    sees Dr. Conley Rolls every 6 months  . Palpitations   . Temporomandibular joint-pain-dysfunction syndrome (TMJ) 11/16/2010    Past Surgical History:  Procedure Laterality Date  . CATARACT EXTRACTION, BILATERAL    . COLONOSCOPY W/ POLYPECTOMY     last 12'15  . COSMETIC SURGERY     "facial"  . DILATION AND CURETTAGE OF UTERUS    . ESOPHAGOGASTRODUODENOSCOPY (EGD) WITH PROPOFOL N/A 01/20/2015   Procedure: ESOPHAGOGASTRODUODENOSCOPY (EGD) WITH PROPOFOL;  Surgeon: Lafayette Dragon, MD;  [facility]: [facility];  Service: Endoscopy;  Laterality: N/A;  . EXCISIONAL HEMORRHOIDECTOMY    . EYE SURGERY    . SAVORY DILATION N/A 01/20/2015   Procedure: SAVORY DILATION;  Surgeon: Lafayette Dragon, MD;  Location: [facility];  Service: Endoscopy;  Laterality: N/A;  . TUBAL LIGATION      Current Medications: Outpatient Medications Prior to Visit  Medication Sig Dispense Refill  . ALPRAZolam (XANAX) 0.25 MG tablet Take 0.25 mg by mouth daily as needed for anxiety.    Marland Kitchen aspirin 81 MG tablet Take 81 mg by mouth daily.      . busPIRone (BUSPAR) 10 MG tablet take 1 tablet by mouth once daily 90 tablet 3  . calcium carbonate 200 MG capsule Take 1,800 mg by mouth daily.     Marland Kitchen  cholecalciferol (VITAMIN D) 1000 UNITS tablet Take 1,000 Units by mouth daily.      . Multiple Vitamin (MULTIVITAMIN) tablet Take 1 tablet by mouth daily.      Marland Kitchen atenolol (TENORMIN) 50 MG tablet Take 1 tablet (50 mg total) by mouth 2 (two) times daily. 180 tablet 3   No facility-administered medications prior to visit.      Allergies:   Augmentin [amoxicillin-pot clavulanate] and Vioxx [rofecoxib]   Social History   Social History  . Marital status: Married    Spouse name: N/A  . Number of children: N/A  . Years of education: N/A   Social History Main Topics  . Smoking  status: Former Smoker    Types: Cigarettes  . Smokeless tobacco: Never Used     Comment: social smoker -no use in 30 yrs  . Alcohol use Yes     Comment: 2-3 times monthly shots of liquor/mixed drinks  . Drug use: No  . Sexual activity: Not Asked   Other Topics Concern  . None   Social History Narrative  . None     Family History:  The patient's family history includes Cancer in her brother and father; Diabetes in her brother, brother, mother, sister, and sister; Kidney disease in her father.   ROS:   Please see the history of present illness.    Review of Systems  All other systems reviewed and are negative.   PHYSICAL EXAM:   VS:  BP 124/66   Pulse 61   Ht 5\' 3"  (1.6 m)   Wt 105 lb (47.6 kg)   SpO2 98%   BMI 18.60 kg/m    GEN: Well nourished, well developed, in no acute distress  HEENT: normal  Neck: no JVD, carotid bruits, or masses Cardiac: RRR; no murmurs (does have mild aortic regurgitation but I do not appreciated on exam), rubs, or gallops,no edema  Respiratory:  clear to auscultation bilaterally, normal work of breathing GI: soft, nontender, nondistended, + BS MS: no deformity or atrophy  Skin: warm and dry, no rash Neuro:  Alert and Oriented x 3, Strength and sensation are intact Psych: euthymic mood, full affect  Wt Readings from Last 3 Encounters:  05/27/17 105 lb (47.6 kg)  11/20/16 105 lb 12.8 oz (48 kg)  05/21/16 107 lb (48.5 kg)      Studies/Labs Reviewed:   EKG:  EKG is not ordered today.   Recent Labs: No results found for requested labs within last 8760 hours.   Lipid Panel No results found for: CHOL, TRIG, HDL, CHOLHDL, VLDL, LDLCALC, LDLDIRECT  Additional studies/ records that were reviewed today include:   ECHO 11/30/15: - Left ventricle: The cavity size was normal. Wall thickness was   increased in a pattern of mild LVH. Systolic function was normal.   The estimated ejection fraction was in the range of 60% to 65%.   Wall motion  was normal; there were no regional wall motion   abnormalities. Left ventricular diastolic function parameters   were normal. - Aortic valve: Sclerosis without stenosis. There was mild   regurgitation. - Left atrium: The atrium was normal in size. - Tricuspid valve: There was trivial regurgitation. - Pulmonary arteries: PA peak pressure: 26 mm Hg (S). - Inferior vena cava: The vessel was normal in size. The   respirophasic diameter changes were in the normal range (= 50%),   consistent with normal central venous pressure.  Impressions:  - LVEF 60-65%, mild LVH, normal wall motion, normal  diastolic   function, normal LA size, aortic sclerosis with mild AI, trivial   TR, normal RVSP, normal IVC.  Event monitor 05/23/16:  Rare PVCs (likely cause of palpitations)  No atrial fibrillation  No significant bradycardia  Candee Furbish, MD   ASSESSMENT:    1. History of palpitations   2. Mitral valve prolapse   3. History of chest pain   4. Aortic regurgitation       PLAN:  In order of problems listed above:  Hx of palpitations: Quiescent on a beta blocker and xanax. She mentioned previously that she thinks that she has atrial fibrillation. This is never been officially documented.  Thankfully, event monitor did not show any evidence of this, PVCs only.  Reassuring.  Refilling her atenolol.  History of MVP: stable. None reported on echo.  Reassurance  History of chest pain felt to be 2/2 costochondritis: stable.   Hx of mild AI: No murmur on exam. 11/2015 ECHO reassuring. Mild AI.  Should be of no clinical consequence.  Consider repeating echocardiogram at 5-year [facility]ast.  We will see her back in 1 year.  Dr. Harrington Challenger has been seeing her as well.  Medication Adjustments/Labs and Tests Ordered: Current medicines are reviewed at length with the patient today.  Concerns regarding medicines are outlined above.  Medication changes, Labs and Tests ordered today are listed in the  Patient Instructions below. Patient Instructions  Medication Instructions:  The current medical regimen is effective;  continue present plan and medications.  Follow-Up: Follow up in 1 year with Dr. Marlou Porch.  You will receive a letter in the mail 2 months before you are due.  Please call us when you receive this letter to schedule your follow up appointment.  If you need a refill on your cardiac medications before your next appointment, please call your pharmacy.  Thank you for choosing [facility]!!        Signed, Candee Furbish, MD  05/27/2017 9:42 AM    [facility] [address], [place], [place]  [address] Phone: [phone]; Fax: [phone]

## 2017-05-27 ENCOUNTER — Ambulatory Visit (INDEPENDENT_AMBULATORY_CARE_PROVIDER_SITE_OTHER): Payer: BLUE CROSS/BLUE SHIELD | Admitting: Cardiology

## 2017-05-27 ENCOUNTER — Encounter: Payer: Self-pay | Admitting: Cardiology

## 2017-05-27 VITALS — BP 124/66 | HR 61 | Ht 63.0 in | Wt 105.0 lb

## 2017-05-27 DIAGNOSIS — Z87898 Personal history of other specified conditions: Secondary | ICD-10-CM | POA: Diagnosis not present

## 2017-05-27 DIAGNOSIS — I351 Nonrheumatic aortic (valve) insufficiency: Secondary | ICD-10-CM | POA: Diagnosis not present

## 2017-05-27 DIAGNOSIS — R002 Palpitations: Secondary | ICD-10-CM

## 2017-05-27 MED ORDER — ATENOLOL 50 MG PO TABS: 50.0000 mg | ORAL_TABLET | Freq: Two times a day (BID) | ORAL | 3 refills | Status: DC

## 2017-05-27 NOTE — Patient Instructions (Signed)

## 2017-06-07 DIAGNOSIS — J203 Acute bronchitis due to coxsackievirus: Secondary | ICD-10-CM | POA: Diagnosis not present

## 2017-06-07 DIAGNOSIS — J0100 Acute maxillary sinusitis, unspecified: Secondary | ICD-10-CM | POA: Diagnosis not present

## 2017-06-18 DIAGNOSIS — F419 Anxiety disorder, unspecified: Secondary | ICD-10-CM | POA: Diagnosis not present

## 2017-06-18 DIAGNOSIS — I493 Ventricular premature depolarization: Secondary | ICD-10-CM | POA: Diagnosis not present

## 2017-06-24 DIAGNOSIS — E042 Nontoxic multinodular goiter: Secondary | ICD-10-CM | POA: Diagnosis not present

## 2017-06-24 DIAGNOSIS — E7800 Pure hypercholesterolemia, unspecified: Secondary | ICD-10-CM | POA: Diagnosis not present

## 2017-06-24 DIAGNOSIS — M810 Age-related osteoporosis without current pathological fracture: Secondary | ICD-10-CM | POA: Diagnosis not present

## 2017-06-24 DIAGNOSIS — Z862 Personal history of diseases of the blood and blood-forming organs and certain disorders involving the immune mechanism: Secondary | ICD-10-CM | POA: Diagnosis not present

## 2017-08-13 DIAGNOSIS — Z86010 Personal history of colonic polyps: Secondary | ICD-10-CM | POA: Diagnosis not present

## 2017-08-13 DIAGNOSIS — Z1231 Encounter for screening mammogram for malignant neoplasm of breast: Secondary | ICD-10-CM | POA: Diagnosis not present

## 2017-08-13 DIAGNOSIS — Z01419 Encounter for gynecological examination (general) (routine) without abnormal findings: Secondary | ICD-10-CM | POA: Diagnosis not present

## 2017-08-13 DIAGNOSIS — Z8041 Family history of malignant neoplasm of ovary: Secondary | ICD-10-CM | POA: Diagnosis not present

## 2017-08-13 DIAGNOSIS — Z803 Family history of malignant neoplasm of breast: Secondary | ICD-10-CM | POA: Diagnosis not present

## 2017-08-13 DIAGNOSIS — Z800 Family history of malignant neoplasm of digestive organs: Secondary | ICD-10-CM | POA: Diagnosis not present

## 2017-09-25 DIAGNOSIS — Z809 Family history of malignant neoplasm, unspecified: Secondary | ICD-10-CM | POA: Diagnosis not present

## 2017-12-04 DIAGNOSIS — R29898 Other symptoms and signs involving the musculoskeletal system: Secondary | ICD-10-CM | POA: Diagnosis not present

## 2017-12-13 DIAGNOSIS — G9389 Other specified disorders of brain: Secondary | ICD-10-CM | POA: Diagnosis not present

## 2017-12-13 DIAGNOSIS — R930 Abnormal findings on diagnostic imaging of skull and head, not elsewhere classified: Secondary | ICD-10-CM | POA: Diagnosis not present

## 2017-12-13 DIAGNOSIS — I6523 Occlusion and stenosis of bilateral carotid arteries: Secondary | ICD-10-CM | POA: Diagnosis not present

## 2017-12-25 DIAGNOSIS — G459 Transient cerebral ischemic attack, unspecified: Secondary | ICD-10-CM | POA: Diagnosis not present

## 2017-12-25 DIAGNOSIS — I679 Cerebrovascular disease, unspecified: Secondary | ICD-10-CM | POA: Diagnosis not present

## 2018-02-04 DIAGNOSIS — G9389 Other specified disorders of brain: Secondary | ICD-10-CM | POA: Diagnosis not present

## 2018-02-22 ENCOUNTER — Emergency Department (HOSPITAL_BASED_OUTPATIENT_CLINIC_OR_DEPARTMENT_OTHER)
Admission: EM | Admit: 2018-02-22 | Discharge: 2018-02-22 | Disposition: A | Discharge status: Home / Self Care | Payer: BLUE CROSS/BLUE SHIELD | Attending: Emergency Medicine | Admitting: Emergency Medicine

## 2018-02-22 ENCOUNTER — Encounter (HOSPITAL_BASED_OUTPATIENT_CLINIC_OR_DEPARTMENT_OTHER): Payer: Self-pay | Admitting: Emergency Medicine

## 2018-02-22 ENCOUNTER — Other Ambulatory Visit: Payer: Self-pay

## 2018-02-22 DIAGNOSIS — Z87891 Personal history of nicotine dependence: Secondary | ICD-10-CM | POA: Diagnosis not present

## 2018-02-22 DIAGNOSIS — Z7901 Long term (current) use of anticoagulants: Secondary | ICD-10-CM | POA: Diagnosis not present

## 2018-02-22 DIAGNOSIS — Z7902 Long term (current) use of antithrombotics/antiplatelets: Secondary | ICD-10-CM | POA: Insufficient documentation

## 2018-02-22 DIAGNOSIS — I4891 Unspecified atrial fibrillation: Secondary | ICD-10-CM | POA: Insufficient documentation

## 2018-02-22 DIAGNOSIS — R002 Palpitations: Secondary | ICD-10-CM | POA: Diagnosis present

## 2018-02-22 DIAGNOSIS — Z85828 Personal history of other malignant neoplasm of skin: Secondary | ICD-10-CM | POA: Diagnosis not present

## 2018-02-22 DIAGNOSIS — Z79899 Other long term (current) drug therapy: Secondary | ICD-10-CM | POA: Diagnosis not present

## 2018-02-22 LAB — BASIC METABOLIC PANEL
ANION GAP: 10 (ref 5–15)
BUN: 17 mg/dL (ref 8–23)
CALCIUM: 9.1 mg/dL (ref 8.9–10.3)
CO2: 26 mmol/L (ref 22–32)
CREATININE: 0.91 mg/dL (ref 0.44–1.00)
Chloride: 103 mmol/L (ref 98–111)
GLUCOSE: 93 mg/dL (ref 70–99)
Potassium: 4.5 mmol/L (ref 3.5–5.1)
Sodium: 139 mmol/L (ref 135–145)

## 2018-02-22 LAB — CBC
HCT: 44.1 % (ref 36.0–46.0)
Hemoglobin: 14.6 g/dL (ref 12.0–15.0)
MCH: 32.5 pg (ref 26.0–34.0)
MCHC: 33.1 g/dL (ref 30.0–36.0)
MCV: 98.2 fL (ref 78.0–100.0)
PLATELETS: 205 10*3/uL (ref 150–400)
RBC: 4.49 MIL/uL (ref 3.87–5.11)
RDW: 12.7 % (ref 11.5–15.5)
WBC: 7.1 10*3/uL (ref 4.0–10.5)

## 2018-02-22 LAB — MAGNESIUM: Magnesium: 2.2 mg/dL (ref 1.7–2.4)

## 2018-02-22 MED ORDER — METOPROLOL TARTRATE 5 MG/5ML IV SOLN
2.5000 mg | Freq: Once | INTRAVENOUS | Status: AC
  Administered 2018-02-22: 2.5 mg via INTRAVENOUS
  Filled 2018-02-22: qty 5

## 2018-02-22 MED ORDER — SODIUM CHLORIDE 0.9 % IV BOLUS
1000.0000 mL | Freq: Once | INTRAVENOUS | Status: AC
  Administered 2018-02-22: 1000 mL via INTRAVENOUS

## 2018-02-22 MED ORDER — FLECAINIDE ACETATE 100 MG PO TABS: 300.0000 mg | ORAL_TABLET | Freq: Once | ORAL | Status: DC

## 2018-02-22 MED ORDER — APIXABAN 2.5 MG PO TABS
5.0000 mg | ORAL_TABLET | Freq: Two times a day (BID) | ORAL | Status: DC
  Administered 2018-02-22: 5 mg via ORAL
  Filled 2018-02-22: qty 2

## 2018-02-22 MED ORDER — APIXABAN 5 MG PO TABS: 5.0000 mg | ORAL_TABLET | Freq: Two times a day (BID) | ORAL | 0 refills | Status: DC

## 2018-02-22 NOTE — ED Triage Notes (Signed)
This am woke up at 0150 with heart 'fluttering" denies chest pain . Has had 2 prior episodes of same. Took a 50mg  dose of atenolol at 0200

## 2018-02-22 NOTE — ED Provider Notes (Signed)
[facility]ENCY DEPARTMENT Provider Note   CSN: [id number] Arrival date & time: 02/22/18  0736     History   Chief Complaint Chief Complaint  Patient presents with  . Palpitations    HPI Robin Collins is a 72 y.o. female.  HPI  72 year old female presents with palpitations.  Started around 1:50 AM.  Woke her up from sleep.  She took an atenolol last night like typical and took an extra one this morning.  She is had these at least twice in the past 2 months and typically an extra atenolol and some time, 4-8 hours, will help it go away.  The palpitations have decreased but are still present.  There is no chest pain, shortness of breath, or dizziness.  She has not been sick recently.  She saw her PCP a couple months ago and was diagnosed with a TIA, where she had left arm weakness and numbness.  Had an MRI that showed some old strokes.  She is currently on a baby aspirin.  She is had palpitations on and off like this since 2017 but has never been formally diagnosed with what causes them.  She has a history of mitral valve prolapse.  Past Medical History:  Diagnosis Date  . Cancer ([place])    basal cell  . Cataract   . COLONIC POLYPS, ADENOMATOUS 09/22/2007   Qualifier: Diagnosis of  By: Ronnald Ramp RN, CGRN, Sheri    . Costochondritis    occ. flares, last 3-4 weeks ago.  . Depression    mild  . Dysphagia, pharyngoesophageal phase 01/20/2015  . Dysrhythmia    "palpitations"- occ.now-not frequent  . Heart murmur   . MITRAL VALVE PROLAPSE 09/22/2007   Qualifier: Diagnosis of  By: Ronnald Ramp RN, [place], [place]    . Multiple thyroid nodules    sees Dr. Conley Rolls every 6 months  . Palpitations   . Temporomandibular joint-pain-dysfunction syndrome (TMJ) 11/16/2010    Patient Active Problem List   Diagnosis Date Noted  . Dysphagia, pharyngoesophageal phase 01/20/2015  . Costochondritis 06/29/2011  . Temporomandibular joint-pain-dysfunction syndrome (TMJ) 11/16/2010  . COLONIC  POLYPS, ADENOMATOUS 09/22/2007  . THYROID DISORDER 09/22/2007  . MITRAL VALVE PROLAPSE 09/22/2007  . Palpitations 09/22/2007  . INTERNAL HEMORRHOIDS 07/02/2003    Past Surgical History:  Procedure Laterality Date  . CATARACT EXTRACTION, BILATERAL    . COLONOSCOPY W/ POLYPECTOMY     last 12'15  . COSMETIC SURGERY     "facial"  . DILATION AND CURETTAGE OF UTERUS    . ESOPHAGOGASTRODUODENOSCOPY (EGD) WITH PROPOFOL N/A 01/20/2015   Procedure: ESOPHAGOGASTRODUODENOSCOPY (EGD) WITH PROPOFOL;  Surgeon: Lafayette Dragon, MD;  Location: [facility];  Service: Endoscopy;  Laterality: N/A;  . EXCISIONAL HEMORRHOIDECTOMY    . EYE SURGERY    . SAVORY DILATION N/A 01/20/2015   Procedure: SAVORY DILATION;  Surgeon: Lafayette Dragon, MD;  Location: [facility];  Service: Endoscopy;  Laterality: N/A;  . TUBAL LIGATION       OB History   None      Home Medications    Prior to Admission medications   Medication Sig Start Date End Date Taking? Authorizing Provider  pravastatin (PRAVACHOL) 20 MG tablet Take 20 mg by mouth daily.   Yes [provider]  ALPRAZolam (XANAX) 0.25 MG tablet Take 0.25 mg by mouth daily as needed for anxiety.    [provider]  apixaban (ELIQUIS) 5 MG TABS tablet Take 1 tablet (5 mg total) by mouth 2 (two)  times daily. 02/22/18   Sherwood Gambler, MD  atenolol (TENORMIN) 50 MG tablet Take 1 tablet (50 mg total) by mouth 2 (two) times daily. 05/27/17   Jerline Pain, MD  busPIRone (BUSPAR) 10 MG tablet take 1 tablet by mouth once daily 09/28/14   Darlin Coco, MD  calcium carbonate 200 MG capsule Take 1,800 mg by mouth daily.     [provider]  cholecalciferol (VITAMIN D) 1000 UNITS tablet Take 1,000 Units by mouth daily.      [provider]  Multiple Vitamin (MULTIVITAMIN) tablet Take 1 tablet by mouth daily.      [provider]    Family History Family History  Problem Relation Age of Onset  . Kidney disease Father     . Cancer Father   . Diabetes Mother   . Diabetes Sister   . Diabetes Brother   . Cancer Brother   . Diabetes Sister   . Diabetes Brother     Social History Social History   Tobacco Use  . Smoking status: Former Smoker    Types: Cigarettes  . Smokeless tobacco: Never Used  . Tobacco comment: social smoker -no use in 30 yrs  Substance Use Topics  . Alcohol use: Yes    Comment: 2-3 times monthly shots of liquor/mixed drinks  . Drug use: No     Allergies   Augmentin [amoxicillin-pot clavulanate] and Vioxx [rofecoxib]   Review of Systems Review of Systems  Respiratory: Negative for shortness of breath.   Cardiovascular: Positive for palpitations. Negative for chest pain.  Gastrointestinal: Negative for vomiting.  Neurological: Negative for light-headedness.  All other systems reviewed and are negative.    Physical Exam Updated Vital Signs BP 107/70   Pulse 76   Temp 97.6 F (36.4 C) (Oral)   Resp 16   Ht 5\' 3"  (1.6 m)   Wt 48.1 kg (106 lb)   SpO2 99%   BMI 18.78 kg/m   Physical Exam  Constitutional: She is oriented to person, place, and time. She appears well-developed and well-nourished.  HENT:  Head: Normocephalic and atraumatic.  Right Ear: External ear normal.  Left Ear: External ear normal.  Nose: Nose normal.  Eyes: Right eye exhibits no discharge. Left eye exhibits no discharge.  Cardiovascular: Normal rate and normal heart sounds. An irregular rhythm present.  Pulmonary/Chest: Effort normal and breath sounds normal.  Abdominal: She exhibits no distension.  Neurological: She is alert and oriented to person, place, and time.  Skin: Skin is warm and dry.  Nursing note and vitals reviewed.    ED Treatments / Results  Labs (all labs ordered are listed, but only abnormal results are displayed) Labs Reviewed  BASIC METABOLIC PANEL  MAGNESIUM  CBC    EKG EKG Interpretation  Date/Time:  Saturday February 22 2018 07:45:39 EDT Ventricular Rate:   79 PR Interval:    QRS Duration: 78 QT Interval:  346 QTC Calculation: 397 R Axis:   48 Text Interpretation:  Atrial fibrillation Baseline wander in lead(s) V4 afib new since 2003 Confirmed by Sherwood Gambler [phone]) on 02/22/2018 7:56:02 AM   Radiology No results found.  Procedures Procedures (including critical care time)  Medications Ordered in ED Medications  flecainide (TAMBOCOR) tablet 300 mg (300 mg Oral Not Given 02/22/18 0929)  apixaban (ELIQUIS) tablet 5 mg (5 mg Oral Given 02/22/18 0924)  sodium chloride 0.9 % bolus 1,000 mL (0 mLs Intravenous Stopped 02/22/18 0924)  metoprolol tartrate (LOPRESSOR) injection 2.5 mg (2.5  mg Intravenous Given 02/22/18 0956)     Initial Impression / Assessment and Plan / ED Course  I have reviewed the triage vital signs and the nursing notes.  Pertinent labs & imaging results that were available during my care of the patient were reviewed by me and considered in my medical decision making (see chart for details).     Patient's ECG shows new onset atrial fibrillation.  She is been having palpitations on and off for at least a couple months but really she is been having for a couple years although it was never captured on outpatient monitoring.  She describes a TIA that she states was diagnosed by PCP 2 months ago.  Given this her CHA2DS2VASC score is a 4.  She will be started on Eliquis after discussion of risks/benefits.  I discussed with cardiology, Dr. Haroldine Laws, as the patient has seemingly paroxysmal A. fib and is currently presenting within 48 hours.  He advises no other change in medicines besides Eliquis 5 mg twice daily.  However he did state it is reasonable to give her flecainide and/or electrical cardioversion given that she felt a sudden onset within the last 48 hours.  I offered this to patient.  Unfortunately we do not have the flecainide here.  When discussing risk/benefits of cardioversion she declines and is hoping that she might  pop out of it at home.  She already has follow-up with her cardiologist in 2 days.  We discussed return precautions.  Final Clinical Impressions(s) / ED Diagnoses   Final diagnoses:  New onset atrial fibrillation [facility])    ED Discharge Orders        Ordered    Amb referral to AFIB Clinic     02/22/18 0756    apixaban (ELIQUIS) 5 MG TABS tablet  2 times daily     02/22/18 1104       Sherwood Gambler, MD 02/22/18 1152

## 2018-02-22 NOTE — Discharge Instructions (Addendum)
If you develop worsening palpitations, dizziness or lightheadedness, chest pain, shortness of breath, or any other new/concerning symptoms and return to the ER for evaluation.  Otherwise follow-up with your cardiologist in 2 days as scheduled.

## 2018-02-22 NOTE — ED Notes (Signed)
Patient is resting comfortably. 

## 2018-02-22 NOTE — ED Notes (Signed)
ED Provider at bedside. 

## 2018-02-22 NOTE — ED Notes (Signed)
Family at bedside. 

## 2018-02-24 ENCOUNTER — Encounter

## 2018-02-24 ENCOUNTER — Encounter: Payer: Self-pay | Admitting: Nurse Practitioner

## 2018-02-24 ENCOUNTER — Ambulatory Visit: Payer: BLUE CROSS/BLUE SHIELD | Admitting: Nurse Practitioner

## 2018-02-24 VITALS — BP 112/60 | HR 56 | Ht 63.0 in | Wt 106.8 lb

## 2018-02-24 DIAGNOSIS — I480 Paroxysmal atrial fibrillation: Secondary | ICD-10-CM

## 2018-02-24 DIAGNOSIS — I341 Nonrheumatic mitral (valve) prolapse: Secondary | ICD-10-CM

## 2018-02-24 DIAGNOSIS — I351 Nonrheumatic aortic (valve) insufficiency: Secondary | ICD-10-CM

## 2018-02-24 DIAGNOSIS — E7849 Other hyperlipidemia: Secondary | ICD-10-CM

## 2018-02-24 DIAGNOSIS — R002 Palpitations: Secondary | ICD-10-CM

## 2018-02-24 DIAGNOSIS — Z79899 Other long term (current) drug therapy: Secondary | ICD-10-CM

## 2018-02-24 LAB — LIPID PANEL
Chol/HDL Ratio: 2.4 ratio (ref 0.0–4.4)
Cholesterol, Total: 195 mg/dL (ref 100–199)
HDL: 81 mg/dL (ref >39)
LDL Calculated: 84 mg/dL (ref 0–99)
Triglycerides: 150 mg/dL — ABNORMAL HIGH (ref 0–149)
VLDL Cholesterol Cal: 30 mg/dL (ref 5–40)

## 2018-02-24 LAB — HEPATIC FUNCTION PANEL
ALT: 20 IU/L (ref 0–32)
AST: 25 IU/L (ref 0–40)
Albumin: 4.4 g/dL (ref 3.5–4.8)
Alkaline Phosphatase: 72 IU/L (ref 39–117)
Bilirubin Total: 0.3 mg/dL (ref 0.0–1.2)
Bilirubin, Direct: 0.1 mg/dL (ref 0.00–0.40)
Total Protein: 7.3 g/dL (ref 6.0–8.5)

## 2018-02-24 MED ORDER — FLECAINIDE ACETATE 50 MG PO TABS: 50.0000 mg | ORAL_TABLET | Freq: Two times a day (BID) | ORAL | 3 refills | Status: DC

## 2018-02-24 MED ORDER — APIXABAN 5 MG PO TABS: 5.0000 mg | ORAL_TABLET | Freq: Two times a day (BID) | ORAL | 3 refills | Status: DC

## 2018-02-24 NOTE — Progress Notes (Signed)
CARDIOLOGY OFFICE NOTE  Date:  02/24/2018    Robin Collins Date of Birth: [birth year] Medical Record #[id number]  PCP:  Robin Cruel, MD  Cardiologist:  [facility]    Chief Complaint  Patient presents with  . Palpitations  . Cardiac Valve Problem    9 month check. Seen for Dr. Marlou Collins    History of Present Illness: Robin Collins is a 72 y.o. female who presents today for a 9 month check. Seen for Dr. Marlou Collins. She is a former patient of Dr. Sherryl Collins.   She has a history of palpitations, atypical chest pain felt to be costochondritis, MVP and mild aortic insufficiency.  She had a normal nuclear stress test in 2003 showing no ischemia and her ejection fraction was 65%. An echocardiogram in April 2010 showing normal systolic and diastolic function with mild aortic insufficiency and trace mitral regurgitation.  ER visit back in 03/2016 for right arm numbness and concern for AF - she has never had documented AF.   Last seen by Dr. Marlou Collins in October - had had a negative event monitor - showing PVCs only. She was felt to be doing well.   In the ER over the weekend - had documented AF - apparently had had TIA about 2 months ago. Started on Eliquis. Discussion about Flecainide and cardioversion - however the ER did not have Flecainide and the patient was noted to decline cardioversion.   Comes in today. Here alone. She was actually given this appointment back in May - she had an episode of left arm numbness/burning for about 4 hours - saw PCP - ended up having MRI - she has had small stroke. She has had 3 episodes of what she calls AF - one in May, June and this past weekend - now lasting about 10 hours. The AF was documented this past weekend. She notes it makes her feel "uncomfortable" but not dizzy or lightheaded. She is on Eliquis - aspirin was stopped. No chest pain. Not short of breath. No syncope. She was started on statin back in May as preventative therapy - has not had  levels rechecked.   Past Medical History:  Diagnosis Date  . Cancer ([place])    basal cell  . Cataract   . COLONIC POLYPS, ADENOMATOUS 09/22/2007   Qualifier: Diagnosis of  By: Robin Collins    . Costochondritis    occ. flares, last 3-4 weeks ago.  . Depression    mild  . Dysphagia, pharyngoesophageal phase 01/20/2015  . Dysrhythmia    "palpitations"- occ.now-not frequent  . Heart murmur   . MITRAL VALVE PROLAPSE 09/22/2007   Qualifier: Diagnosis of  By: Robin Collins    . Multiple thyroid nodules    sees Dr. Conley Collins every 6 months  . Palpitations   . Temporomandibular joint-pain-dysfunction syndrome (TMJ) 11/16/2010    Past Surgical History:  Procedure Laterality Date  . CATARACT EXTRACTION, BILATERAL    . COLONOSCOPY W/ POLYPECTOMY     last 12'15  . COSMETIC SURGERY     "facial"  . DILATION AND CURETTAGE OF UTERUS    . ESOPHAGOGASTRODUODENOSCOPY (EGD) WITH PROPOFOL N/A 01/20/2015   Procedure: ESOPHAGOGASTRODUODENOSCOPY (EGD) WITH PROPOFOL;  Surgeon: Robin Dragon, MD;  Location: [facility];  Service: Endoscopy;  Laterality: N/A;  . EXCISIONAL HEMORRHOIDECTOMY    . EYE SURGERY    . SAVORY DILATION N/A 01/20/2015   Procedure: SAVORY DILATION;  Surgeon: Robin Dragon, MD;  Location:  [facility];  Service: Endoscopy;  Laterality: N/A;  . TUBAL LIGATION       Medications: Current Meds  Medication Sig  . ALPRAZolam (XANAX) 0.25 MG tablet Take 0.25 mg by mouth daily as needed for anxiety.  Marland Kitchen apixaban (ELIQUIS) 5 MG TABS tablet Take 1 tablet (5 mg total) by mouth 2 (two) times daily.  Marland Kitchen atenolol (TENORMIN) 50 MG tablet Take 1 tablet (50 mg total) by mouth 2 (two) times daily.  . busPIRone (BUSPAR) 10 MG tablet take 1 tablet by mouth once daily  . calcium carbonate 200 MG capsule Take 1,800 mg by mouth daily.   . cholecalciferol (VITAMIN D) 1000 UNITS tablet Take 1,000 Units by mouth daily.    . Multiple Vitamin (MULTIVITAMIN) tablet Take 1 tablet by mouth  daily.    . pravastatin (PRAVACHOL) 20 MG tablet Take 20 mg by mouth daily.  . [DISCONTINUED] apixaban (ELIQUIS) 5 MG TABS tablet Take 1 tablet (5 mg total) by mouth 2 (two) times daily.     Allergies: Allergies  Allergen Reactions  . Augmentin [Amoxicillin-Pot Clavulanate] Nausea And Vomiting  . Vioxx [Rofecoxib]     palpitations    Social History: The patient  reports that she has quit smoking. Her smoking use included cigarettes. She has never used smokeless tobacco. She reports that she drinks alcohol. She reports that she does not use drugs.   Family History: The patient's family history includes Cancer in her brother and father; Diabetes in her brother, brother, mother, sister, and sister; Kidney disease in her father.   Review of Systems: Please see the history of present illness.   Otherwise, the review of systems is positive for none.   All other systems are reviewed and negative.   Physical Exam: VS:  BP 112/60   Pulse (!) 56   Ht 5\' 3"  (1.6 m)   Wt 106 lb 12.8 oz (48.4 kg)   SpO2 99%   BMI 18.92 kg/m  .  BMI Body mass index is 18.92 kg/m.  Wt Readings from Last 3 Encounters:  02/24/18 106 lb 12.8 oz (48.4 kg)  02/22/18 106 lb (48.1 kg)  05/27/17 105 lb (47.6 kg)    General: Pleasant. Well developed, well nourished and in no acute distress.   HEENT: Normal.  Neck: Supple, no JVD, carotid bruits, or masses noted.  Cardiac: Regular rate and rhythm. No murmur that I can appreciate. No edema.  Respiratory:  Lungs are clear to auscultation bilaterally with normal work of breathing.  GI: Soft and nontender.  MS: No deformity or atrophy. Gait and ROM intact.  Skin: Warm and dry. Color is normal.  Neuro:  Strength and sensation are intact and no gross focal deficits noted.  Psych: Alert, appropriate and with normal affect.   LABORATORY DATA:  EKG:  EKG is ordered today. This demonstrates sinus brady - reviewed with Dr. Marlou Collins.  Lab Results  Component Value  Date   WBC 7.1 02/22/2018   HGB 14.6 02/22/2018   HCT 44.1 02/22/2018   PLT 205 02/22/2018   GLUCOSE 93 02/22/2018   NA 139 02/22/2018   K 4.5 02/22/2018   CL 103 02/22/2018   CREATININE 0.91 02/22/2018   BUN 17 02/22/2018   CO2 26 02/22/2018       BNP (last 3 results) No results for input(s): BNP in the last 8760 hours.  ProBNP (last 3 results) No results for input(s): PROBNP in the last 8760 hours.   Other Studies Reviewed Today:  MRI BRAIN IMPRESSION 01/2018:   1. Interval development of a very small area of encephalomalacia in the posterior cortex of the right precentral gyrus, at the site of previous mild cortical enhancement. There is now precontrast intrinsic mild T1-hyperintensity at this site, with no  definite enhancement. This is consistent with expected evolution of a small cortical infarction that was previously subacute and is now chronic.  2. No evidence of acute intracranial abnormality.  3. Other chronic findings, as above.  Electronically Signed by: Mali Holder  Result Narrative  MRI BRAIN WITHOUT AND WITH CONTRAST  INDICATION: Cerebrovascular disease, unspecified.  Additional information: Burning/tingling in left arm. Left arm went limp for a couple of hours. Prior abnormal brain MRI.  TECHNIQUE: Multiplanar, multisequence MR imaging of the brain before and after IV contrast administration.  CONTRAST: MultiHance 10 mL.  COMPARISON: 12/13/2017.  FINDINGS: Interval development of a very small area of encephalomalacia in the posterior cortex of the right precentral gyrus, at the site of previous mild cortical enhancement. There is now precontrast intrinsic mild T1-hyperintensity at this site, with  no definite enhancement.  Unchanged small area of cortical and subcortical encephalomalacia in the inferolateral right frontal lobe, consistent with an old infarction.  Scattered patchy areas of T2/FLAIR-hyperintense signal in the bilateral cerebral white  matter, without mass effect, are strictly nonspecific, but consistent with moderate chronic microvascular changes (leukoaraiosis).  Diffusion imaging shows no hyperacute, acute, or early subacute infarction.  No intracranial mass or mass effect.  No hydrocephalus.  Normal signal voids in the larger intracranial vessels.  The paranasal sinuses, tympanic cavities and mastoid air cells are predominantly clear.  The marrow signal pattern is within normal limits.  No abnormal brain parenchymal or leptomeningeal enhancement.  Other Result Information  Acute Interface, Incoming Rad Results - 02/05/2018  7:42 AM EDT MRI BRAIN WITHOUT AND WITH CONTRAST  INDICATION: Cerebrovascular disease, unspecified.  Additional information: Burning/tingling in left arm. Left arm went limp for a couple of hours. Prior abnormal brain MRI.  TECHNIQUE: Multiplanar, multisequence MR imaging of the brain before and after IV contrast administration.  CONTRAST: MultiHance 10 mL.  COMPARISON: 12/13/2017.  FINDINGS: Interval development of a very small area of encephalomalacia in the posterior cortex of the right precentral gyrus, at the site of previous mild cortical enhancement. There is now precontrast intrinsic mild T1-hyperintensity at this site, with  no definite enhancement.  Unchanged small area of cortical and subcortical encephalomalacia in the inferolateral right frontal lobe, consistent with an old infarction.  Scattered patchy areas of T2/FLAIR-hyperintense signal in the bilateral cerebral white matter, without mass effect, are strictly nonspecific, but consistent with moderate chronic microvascular changes (leukoaraiosis).  Diffusion imaging shows no hyperacute, acute, or early subacute infarction.  No intracranial mass or mass effect.  No hydrocephalus.  Normal signal voids in the larger intracranial vessels.  The paranasal sinuses, tympanic cavities and mastoid air cells are predominantly  clear.  The marrow signal pattern is within normal limits.  No abnormal brain parenchymal or leptomeningeal enhancement.   IMPRESSION:   1. Interval development of a very small area of encephalomalacia in the posterior cortex of the right precentral gyrus, at the site of previous mild cortical enhancement. There is now precontrast intrinsic mild T1-hyperintensity at this site, with no  definite enhancement. This is consistent with expected evolution of a small cortical infarction that was previously subacute and is now chronic.  2. No evidence of acute intracranial abnormality.  3. Other chronic findings, as above.  Electronically Signed by: Mali Holder    ECHO 11/30/15: - Left ventricle: The cavity size was normal. Wall thickness was increased in a pattern of mild LVH. Systolic function was normal. The estimated ejection fraction was in the range of 60% to 65%. Wall motion was normal; there were no regional wall motion abnormalities. Left ventricular diastolic function parameters were normal. - Aortic valve: Sclerosis without stenosis. There was mild regurgitation. - Left atrium: The atrium was normal in size. - Tricuspid valve: There was trivial regurgitation. - Pulmonary arteries: PA peak pressure: 26 mm Hg (S). - Inferior vena cava: The vessel was normal in size. The respirophasic diameter changes were in the normal range (= 50%), consistent with normal central venous pressure.  Impressions:  - LVEF 60-65%, mild LVH, normal wall motion, normal diastolic function, normal LA size, aortic sclerosis with mild AI, trivial TR, normal RVSP, normal IVC.  Event monitor 05/23/16:  Rare PVCs (likely cause of palpitations)  No atrial fibrillation  No significant bradycardia  Candee Furbish, MD  Assessment/Plan:  1. New onset AF - CHADSVASC of at least 4 - back in sinus - discussed with Dr. Marlou Collins here in the office - he has recommended starting  Flecainide 50 mg BID. GXT in about 2 weeks. She is now committed to life long anticoagulation. Arrange for EKG in one week.   2. Chronic anticoagulation - has been started on Eliquis - will need lab in one month.   3. Aortic regurgitation - will get echo updated now that we have documented AF  4. MVP - see #3  5. Chronic palpitations - now noted to have AF - see above.   6. Chronic chest pain - felt to be due to costochondritis -  Not reported  7. HLD - now on statin - needs lab today  8. Recent stroke - most likely from AF - now on anticoagulation - no sequale.   Current medicines are reviewed with the patient today.  The patient does not have concerns regarding medicines other than what has been noted above.  The following changes have been made:  See above.  Labs/ tests ordered today include:    Orders Placed This Encounter  Procedures  . Basic metabolic panel  . CBC  . Lipid panel  . Hepatic function panel  . EXERCISE TOLERANCE TEST (ETT)  . EKG 12-Lead  . ECHOCARDIOGRAM COMPLETE     Disposition:   FU with Dr. Marlou Collins in October as planned. I am happy to see back as needed.   Patient is agreeable to this plan and will call if any problems develop in the interim.   SignedTruitt Merle, NP  02/24/2018 8:44 AM  [facility] [address] [place],   [address] Phone: [phone] Fax: [phone]

## 2018-02-24 NOTE — Patient Instructions (Addendum)
We will be checking the following labs today - Lipids and LFTs  BMET & CBC in one month   Medication Instructions:    Continue with your current medicines. BUT  I am refilling the Eliquis to your mail order  I am starting Flecainide 50 mg twice a day - this is at your drug store and I have sent also to the mail order    Testing/Procedures To Be Arranged:  Echocardiogram  GXT in 2 weeks  Follow-Up:   Nurse visit for EKG in one week  See Dr. Marlou Porch in October (has recall)    Other Special Instructions:   N/A    If you need a refill on your cardiac medications before your next appointment, please call your pharmacy.   Call the [place] office at [phone] if you have any questions, problems or concerns.

## 2018-02-27 ENCOUNTER — Encounter

## 2018-03-03 ENCOUNTER — Ambulatory Visit (INDEPENDENT_AMBULATORY_CARE_PROVIDER_SITE_OTHER): Payer: BLUE CROSS/BLUE SHIELD | Admitting: *Deleted

## 2018-03-03 VITALS — BP 120/68 | HR 57 | Ht 63.0 in | Wt 104.5 lb

## 2018-03-03 DIAGNOSIS — I480 Paroxysmal atrial fibrillation: Secondary | ICD-10-CM

## 2018-03-03 DIAGNOSIS — Z79899 Other long term (current) drug therapy: Secondary | ICD-10-CM

## 2018-03-03 NOTE — Progress Notes (Signed)
1.) Reason for visit: 12 lead EKG, BP 120/68.  2.) Name of MD requesting visit: Truitt Merle NP  3.) H&P: Palpitations,Paroxysmal A-tachycardia  4.) ROS related to problem: pt was prescribed Flecainide 50 mg BID  5.) Assessment and plan per 12 lead EKG done per nurse read per DOD   Dr. Rayann Heman MD. recommends for pt to continued taking the medication as prescribed.

## 2018-03-03 NOTE — Patient Instructions (Signed)
Pt took medication this AM. Pt has no complaints. Pt  is aware to continue the Flecainide medication as prescribed. Pt D/C home in stable conditions.

## 2018-03-11 ENCOUNTER — Ambulatory Visit (INDEPENDENT_AMBULATORY_CARE_PROVIDER_SITE_OTHER): Payer: BLUE CROSS/BLUE SHIELD

## 2018-03-11 ENCOUNTER — Other Ambulatory Visit: Payer: Self-pay

## 2018-03-11 ENCOUNTER — Ambulatory Visit (HOSPITAL_COMMUNITY): Payer: BLUE CROSS/BLUE SHIELD | Attending: Cardiology

## 2018-03-11 DIAGNOSIS — I351 Nonrheumatic aortic (valve) insufficiency: Secondary | ICD-10-CM

## 2018-03-11 DIAGNOSIS — I480 Paroxysmal atrial fibrillation: Secondary | ICD-10-CM | POA: Insufficient documentation

## 2018-03-11 DIAGNOSIS — I080 Rheumatic disorders of both mitral and aortic valves: Secondary | ICD-10-CM | POA: Insufficient documentation

## 2018-03-11 DIAGNOSIS — Z79899 Other long term (current) drug therapy: Secondary | ICD-10-CM | POA: Insufficient documentation

## 2018-03-11 LAB — EXERCISE TOLERANCE TEST
Estimated workload: 5.5 METS
Exercise duration (min): 4 min
Exercise duration (sec): 10 s
MPHR: 146 {beats}/min
Peak HR: 90 {beats}/min
Percent HR: 61 %
RPE: 16
Rest HR: 58 {beats}/min

## 2018-03-24 ENCOUNTER — Other Ambulatory Visit: Payer: BLUE CROSS/BLUE SHIELD | Admitting: *Deleted

## 2018-03-24 DIAGNOSIS — I480 Paroxysmal atrial fibrillation: Secondary | ICD-10-CM | POA: Diagnosis not present

## 2018-03-24 LAB — BASIC METABOLIC PANEL
BUN/Creatinine Ratio: 13 (ref 12–28)
BUN: 11 mg/dL (ref 8–27)
CO2: 25 mmol/L (ref 20–29)
Calcium: 10 mg/dL (ref 8.7–10.3)
Chloride: 102 mmol/L (ref 96–106)
Creatinine, Ser: 0.82 mg/dL (ref 0.57–1.00)
GFR calc Af Amer: 82 mL/min/{1.73_m2} (ref >59)
GFR calc non Af Amer: 71 mL/min/{1.73_m2} (ref >59)
Glucose: 85 mg/dL (ref 65–99)
Potassium: 4.9 mmol/L (ref 3.5–5.2)
Sodium: 141 mmol/L (ref 134–144)

## 2018-03-24 LAB — CBC
Hematocrit: 39.4 % (ref 34.0–46.6)
Hemoglobin: 13.2 g/dL (ref 11.1–15.9)
MCH: 32 pg (ref 26.6–33.0)
MCHC: 33.5 g/dL (ref 31.5–35.7)
MCV: 95 fL (ref 79–97)
Platelets: 239 10*3/uL (ref 150–450)
RBC: 4.13 x10E6/uL (ref 3.77–5.28)
RDW: 13.2 % (ref 12.3–15.4)
WBC: 7.7 10*3/uL (ref 3.4–10.8)

## 2018-04-08 DIAGNOSIS — H26492 Other secondary cataract, left eye: Secondary | ICD-10-CM | POA: Diagnosis not present

## 2018-04-08 DIAGNOSIS — H35363 Drusen (degenerative) of macula, bilateral: Secondary | ICD-10-CM | POA: Diagnosis not present

## 2018-04-08 DIAGNOSIS — Z961 Presence of intraocular lens: Secondary | ICD-10-CM | POA: Diagnosis not present

## 2018-04-08 DIAGNOSIS — H26491 Other secondary cataract, right eye: Secondary | ICD-10-CM | POA: Diagnosis not present

## 2018-04-19 ENCOUNTER — Other Ambulatory Visit: Payer: Self-pay | Admitting: Cardiology

## 2018-04-23 DIAGNOSIS — F419 Anxiety disorder, unspecified: Secondary | ICD-10-CM | POA: Diagnosis not present

## 2018-04-23 DIAGNOSIS — Z23 Encounter for immunization: Secondary | ICD-10-CM | POA: Diagnosis not present

## 2018-05-07 ENCOUNTER — Encounter: Payer: Self-pay | Admitting: Nurse Practitioner

## 2018-05-07 ENCOUNTER — Ambulatory Visit: Payer: BLUE CROSS/BLUE SHIELD | Admitting: Nurse Practitioner

## 2018-05-07 VITALS — BP 134/70 | HR 53 | Ht 63.0 in | Wt 105.8 lb

## 2018-05-07 DIAGNOSIS — I480 Paroxysmal atrial fibrillation: Secondary | ICD-10-CM | POA: Diagnosis not present

## 2018-05-07 DIAGNOSIS — Z79899 Other long term (current) drug therapy: Secondary | ICD-10-CM | POA: Diagnosis not present

## 2018-05-07 NOTE — Progress Notes (Signed)
CARDIOLOGY OFFICE NOTE  Date:  05/07/2018    [name] Date of Birth: 23-Mar-1946 Medical Record #[id number]  PCP:  [name], MD  Cardiologist:  [place]    Chief Complaint  Patient presents with  . Atrial Fibrillation    Follow up visit - seen for Dr. Marlou Porch    History of Present Illness: Robin Collins is a 72 y.o. female who presents today for a 3 month check. Seen for Dr. Marlou Porch. She is a former patient of Dr. Sherryl Barters.   She has a history of palpitations, atypical chest pain felt to be costochondritis, MVP and mild aortic insufficiency.  She had a normal nuclear stress test in 2003 showing no ischemia and her ejection fraction was 65%. An echocardiogram in April 2010 showing normal systolic and diastolic function with mild aortic insufficiency and trace mitral regurgitation.  ER visit back in 03/2016 for right arm numbness and concern for AF - she had never had documented AF.   Last seen by Dr. Marlou Porch in October - had had a negative event monitor - showing PVCs only. She was felt to be doing well.   In the ER in July - had documented AF - apparently had had TIA about 2 months earlier. Started on Eliquis. Discussion about Flecainide and cardioversion - however the ER did not have Flecainide and the patient was noted to decline cardioversion. I then saw her in the office - she was doing ok. Still have bursts of AF. Had been started on statin. Discussed with Dr. Marlou Porch and we elected to start Flecainide. Echo was updated in light of her known aortic valve disease.   Comes in today. Here alone. Noting multitude of complains that she feels is related to Flecainide. She feels tired, no energy, hair falling out, pronounced heartbeat - especially at night that is causing trouble sleeping, etc. She feels mentally and emotionally affected. She does remain on her Eliquis. She has no bleeding/bruising issues. No chest pain noted. She is asking about  possible ablation. She has not noted any actual atrial fib with the Flecainide.   Past Medical History:  Diagnosis Date  . Cancer ([place])    basal cell  . Cataract   . COLONIC POLYPS, ADENOMATOUS 09/22/2007   Qualifier: Diagnosis of  By: Ronnald Ramp RN, CGRN, Sheri    . Costochondritis    occ. flares, last 3-4 weeks ago.  . Depression    mild  . Dysphagia, pharyngoesophageal phase 01/20/2015  . Dysrhythmia    "palpitations"- occ.now-not frequent  . Heart murmur   . MITRAL VALVE PROLAPSE 09/22/2007   Qualifier: Diagnosis of  By: Ronnald Ramp RN, Echo, [place]    . Multiple thyroid nodules    sees Dr. Conley Rolls every 6 months  . Palpitations   . Temporomandibular joint-pain-dysfunction syndrome (TMJ) 11/16/2010    Past Surgical History:  Procedure Laterality Date  . CATARACT EXTRACTION, BILATERAL    . COLONOSCOPY W/ POLYPECTOMY     last 12'15  . COSMETIC SURGERY     "facial"  . DILATION AND CURETTAGE OF UTERUS    . ESOPHAGOGASTRODUODENOSCOPY (EGD) WITH PROPOFOL N/A 01/20/2015   Procedure: ESOPHAGOGASTRODUODENOSCOPY (EGD) WITH PROPOFOL;  Surgeon: Lafayette Dragon, MD;  Location: [facility];  Service: Endoscopy;  Laterality: N/A;  . EXCISIONAL HEMORRHOIDECTOMY    . EYE SURGERY    . SAVORY DILATION N/A 01/20/2015   Procedure: SAVORY DILATION;  Surgeon: Lafayette Dragon, MD;  Location: [facility];  Service:  Endoscopy;  Laterality: N/A;  . TUBAL LIGATION       Medications: Current Meds  Medication Sig  . ALPRAZolam (XANAX) 0.25 MG tablet Take 0.25 mg by mouth daily as needed for anxiety.  Marland Kitchen apixaban (ELIQUIS) 5 MG TABS tablet Take 1 tablet (5 mg total) by mouth 2 (two) times daily.  Marland Kitchen atenolol (TENORMIN) 50 MG tablet TAKE 1 TABLET BY MOUTH TWICE DAILY  . B Complex Vitamins (VITAMIN B COMPLEX PO) Take 1 tablet by mouth once a day  . busPIRone (BUSPAR) 10 MG tablet take 1 tablet by mouth once daily  . calcium carbonate 200 MG capsule Take 1,800 mg by mouth daily.   . cholecalciferol (VITAMIN D)  1000 UNITS tablet Take 1,000 Units by mouth daily.    . flecainide (TAMBOCOR) 50 MG tablet Take 1 tablet (50 mg total) by mouth 2 (two) times daily.  . Multiple Vitamin (MULTIVITAMIN) tablet Take 1 tablet by mouth daily.    . pravastatin (PRAVACHOL) 20 MG tablet Take 20 mg by mouth daily.     Allergies: Allergies  Allergen Reactions  . Flecainide Other (See Comments)    Hair loss, high bp  . Augmentin [Amoxicillin-Pot Clavulanate] Nausea And Vomiting  . Vioxx [Rofecoxib]     palpitations    Social History: The patient  reports that she has quit smoking. Her smoking use included cigarettes. She has never used smokeless tobacco. She reports that she drinks alcohol. She reports that she does not use drugs.   Family History: The patient's family history includes Cancer in her brother and father; Diabetes in her brother, brother, mother, sister, and sister; Kidney disease in her father.   Review of Systems: Please see the history of present illness.   Otherwise, the review of systems is positive for none.   All other systems are reviewed and negative.   Physical Exam: VS:  BP 134/70 (BP Location: Left Arm, Patient Position: Sitting, Cuff Size: Normal)   Pulse (!) 53   Ht 5\' 3"  (1.6 m)   Wt 105 lb 12.8 oz (48 kg)   BMI 18.74 kg/m  .  BMI Body mass index is 18.74 kg/m.  Wt Readings from Last 3 Encounters:  05/07/18 105 lb 12.8 oz (48 kg)  03/03/18 104 lb 8 oz (47.4 kg)  02/24/18 106 lb 12.8 oz (48.4 kg)    General: Pleasant. She looks younger than her stated age. She is thin. She is in no acute distress.   HEENT: Normal.  Neck: Supple, no JVD, carotid bruits, or masses noted.  Cardiac: Regular rate and rhythm. No murmurs, rubs, or gallops. No edema.  Respiratory:  Lungs are clear to auscultation bilaterally with normal work of breathing.  GI: Soft and nontender.  MS: No deformity or atrophy. Gait and ROM intact.  Skin: Warm and dry. Color is normal.  Neuro:  Strength and  sensation are intact and no gross focal deficits noted.  Psych: Alert, appropriate and with normal affect.   LABORATORY DATA:  EKG:  EKG is ordered today. This demonstrates sinus bradycardia.   Lab Results  Component Value Date   WBC 7.7 03/24/2018   HGB 13.2 03/24/2018   HCT 39.4 03/24/2018   PLT 239 03/24/2018   GLUCOSE 85 03/24/2018   CHOL 195 02/24/2018   TRIG 150 (H) 02/24/2018   HDL 81 02/24/2018   LDLCALC 84 02/24/2018   ALT 20 02/24/2018   AST 25 02/24/2018   NA 141 03/24/2018   K 4.9 03/24/2018  CL 102 03/24/2018   CREATININE 0.82 03/24/2018   BUN 11 03/24/2018   CO2 25 03/24/2018     BNP (last 3 results) No results for input(s): BNP in the last 8760 hours.  ProBNP (last 3 results) No results for input(s): PROBNP in the last 8760 hours.   Other Studies Reviewed Today:  GXT Study Highlights 02/2018    Blood pressure demonstrated a normal response to exercise.  There was no ST segment deviation noted during stress.   Submaximal study but no ischemic ST segment changes.   Study was done due to flecainide use, no concerning ECG changes.        Echo Study Conclusions 02/2018  - Left ventricle: The cavity size was normal. Wall thickness was   normal. Systolic function was normal. The estimated ejection   fraction was in the range of 55% to 60%. Wall motion was normal;   there were no regional wall motion abnormalities. Left   ventricular diastolic function parameters were normal. - Aortic valve: There was mild to moderate regurgitation. - Mitral valve: There was mild regurgitation.  Impressions:  - Normal LV function; mild to moderate AI; mild MR and TR.   MRI BRAIN IMPRESSION 01/2018:   1. Interval development of a very small area of encephalomalacia in the posterior cortex of the right precentral gyrus, at the site of previous mild cortical enhancement. There is now precontrast intrinsic mild T1-hyperintensity at this site, with no    definite enhancement. This is consistent with expected evolution of a small cortical infarction that was previously subacute and is now chronic.  2. No evidence of acute intracranial abnormality.  3. Other chronic findings, as above.  Electronically Signed by: Mali Holder  Result Narrative  MRI BRAIN WITHOUT AND WITH CONTRAST  INDICATION: Cerebrovascular disease, unspecified.  Additional information: Burning/tingling in left arm. Left arm went limp for a couple of hours. Prior abnormal brain MRI.  TECHNIQUE: Multiplanar, multisequence MR imaging of the brain before and after IV contrast administration.  CONTRAST: MultiHance 10 mL.  COMPARISON: 12/13/2017.  FINDINGS: Interval development of a very small area of encephalomalacia in the posterior cortex of the right precentral gyrus, at the site of previous mild cortical enhancement. There is now precontrast intrinsic mild T1-hyperintensity at this site, with no definite enhancement.  Unchanged small area of cortical and subcortical encephalomalacia in the inferolateral right frontal lobe, consistent with an old infarction.  Scattered patchy areas of T2/FLAIR-hyperintense signal in the bilateral cerebral white matter, without mass effect, are strictly nonspecific, but consistent with moderate chronic microvascular changes (leukoaraiosis).  Diffusion imaging shows no hyperacute, acute, or early subacute infarction.  No intracranial mass or mass effect.  No hydrocephalus.  Normal signal voids in the larger intracranial vessels.  The paranasal sinuses, tympanic cavities and mastoid air cells are predominantly clear.  The marrow signal pattern is within normal limits.  No abnormal brain parenchymal or leptomeningeal enhancement.  Other Result Information  Acute Interface, Incoming Rad Results - 02/05/2018  7:42 AM EDT MRI BRAIN WITHOUT AND WITH CONTRAST  INDICATION: Cerebrovascular disease, unspecified.  Additional  information: Burning/tingling in left arm. Left arm went limp for a couple of hours. Prior abnormal brain MRI.  TECHNIQUE: Multiplanar, multisequence MR imaging of the brain before and after IV contrast administration.  CONTRAST: MultiHance 10 mL.  COMPARISON: 12/13/2017.  FINDINGS: Interval development of a very small area of encephalomalacia in the posterior cortex of the right precentral gyrus, at the site of previous mild cortical  enhancement. There is now precontrast intrinsic mild T1-hyperintensity at this site, with  no definite enhancement.  Unchanged small area of cortical and subcortical encephalomalacia in the inferolateral right frontal lobe, consistent with an old infarction.  Scattered patchy areas of T2/FLAIR-hyperintense signal in the bilateral cerebral white matter, without mass effect, are strictly nonspecific, but consistent with moderate chronic microvascular changes (leukoaraiosis).  Diffusion imaging shows no hyperacute, acute, or early subacute infarction.  No intracranial mass or mass effect.  No hydrocephalus.  Normal signal voids in the larger intracranial vessels.  The paranasal sinuses, tympanic cavities and mastoid air cells are predominantly clear.  The marrow signal pattern is within normal limits.  No abnormal brain parenchymal or leptomeningeal enhancement.   IMPRESSION:   1. Interval development of a very small area of encephalomalacia in the posterior cortex of the right precentral gyrus, at the site of previous mild cortical enhancement. There is now precontrast intrinsic mild T1-hyperintensity at this site, with no  definite enhancement. This is consistent with expected evolution of a small cortical infarction that was previously subacute and is now chronic.  2. No evidence of acute intracranial abnormality.  3. Other chronic findings, as above.  Electronically Signed by: Mali Holder     Event monitor 05/23/16:  Rare PVCs (likely  cause of palpitations)  No atrial fibrillation  No significant bradycardia  Candee Furbish, MD  Assessment/Plan:  1. PAF - CHADSVASC of at least 4 - she remains on Flecainide. She remains in NSR. She is having lots of symptoms that she is attributing to her Flecainide. She is asking about possible ablation. I have referred her to the AF clinic for further disposition. She wishes to keep her follow up with Dr. Marlou Porch as planned for later this month.   2. Chronic anticoagulation - no problems noted.   3. Aortic regurgitation - recent echo noted. Would follow.   4. MVP - recent echo noted. Mild MR noted.   5. Chronic palpitations - this has improved.   6. Chronic chest pain - felt to be due to costochondritis -  Not reported today.   7. HLD - on statin - recent labs noted.   8. Prior stroke - most likely from AF - now on anticoagulation - no sequale.    Current medicines are reviewed with the patient today.  The patient does not have concerns regarding medicines other than what has been noted above.  The following changes have been made:  See above.  Labs/ tests ordered today include:    Orders Placed This Encounter  Procedures  . Amb Referral to AFIB Clinic  . EKG 12-Lead     Disposition:   Referral to AF clinic. She wishes to keep her follow up with Dr. Marlou Porch as planned for later this month as well.   Patient is agreeable to this plan and will call if any problems develop in the interim.   SignedTruitt Merle, NP  05/07/2018 8:33 AM  [facility] [address] [place], [place]  [address] Phone: [phone] Fax: [phone]

## 2018-05-07 NOTE — Patient Instructions (Addendum)
We will be checking the following labs today - NONE   Medication Instructions:    Continue with your current medicines.    If you need a refill on your cardiac medications before your next appointment, please call your pharmacy.     Testing/Procedures To Be Arranged:  N/A  Follow-Up:   See Dr. Marlou Porch as planned.  I have referred you to the Atrial fib clinic     At [facility], you and your health needs are our priority.  As part of our continuing mission to provide you with exceptional heart care, we have created designated Provider Care Teams.  These Care Teams include your primary Cardiologist (physician) and Advanced Practice Providers (APPs -  Physician Assistants and Nurse Practitioners) who all work together to provide you with the care you need, when you need it.  Special Instructions:  . None  Call the [place] office at [phone] if you have any questions, problems or concerns.

## 2018-05-08 DIAGNOSIS — D2262 Melanocytic nevi of left upper limb, including shoulder: Secondary | ICD-10-CM | POA: Diagnosis not present

## 2018-05-08 DIAGNOSIS — Z419 Encounter for procedure for purposes other than remedying health state, unspecified: Secondary | ICD-10-CM | POA: Diagnosis not present

## 2018-05-08 DIAGNOSIS — Z85828 Personal history of other malignant neoplasm of skin: Secondary | ICD-10-CM | POA: Diagnosis not present

## 2018-05-08 DIAGNOSIS — D225 Melanocytic nevi of trunk: Secondary | ICD-10-CM | POA: Diagnosis not present

## 2018-05-08 DIAGNOSIS — L814 Other melanin hyperpigmentation: Secondary | ICD-10-CM | POA: Diagnosis not present

## 2018-05-12 ENCOUNTER — Encounter (HOSPITAL_COMMUNITY): Payer: Self-pay | Admitting: Nurse Practitioner

## 2018-05-12 ENCOUNTER — Ambulatory Visit (HOSPITAL_COMMUNITY)
Admission: RE | Admit: 2018-05-12 | Discharge: 2018-05-12 | Disposition: A | Discharge status: Home / Self Care | Payer: BLUE CROSS/BLUE SHIELD | Source: Ambulatory Visit | Attending: Nurse Practitioner | Admitting: Nurse Practitioner

## 2018-05-12 VITALS — BP 128/82 | HR 50 | Ht 63.0 in | Wt 107.0 lb

## 2018-05-12 DIAGNOSIS — E042 Nontoxic multinodular goiter: Secondary | ICD-10-CM | POA: Insufficient documentation

## 2018-05-12 DIAGNOSIS — I341 Nonrheumatic mitral (valve) prolapse: Secondary | ICD-10-CM | POA: Diagnosis not present

## 2018-05-12 DIAGNOSIS — F329 Major depressive disorder, single episode, unspecified: Secondary | ICD-10-CM | POA: Diagnosis not present

## 2018-05-12 DIAGNOSIS — I480 Paroxysmal atrial fibrillation: Secondary | ICD-10-CM | POA: Diagnosis not present

## 2018-05-12 DIAGNOSIS — I4891 Unspecified atrial fibrillation: Secondary | ICD-10-CM | POA: Diagnosis present

## 2018-05-12 DIAGNOSIS — Z87891 Personal history of nicotine dependence: Secondary | ICD-10-CM | POA: Insufficient documentation

## 2018-05-12 DIAGNOSIS — I4819 Other persistent atrial fibrillation: Secondary | ICD-10-CM | POA: Diagnosis not present

## 2018-05-12 DIAGNOSIS — Z79899 Other long term (current) drug therapy: Secondary | ICD-10-CM | POA: Diagnosis not present

## 2018-05-12 DIAGNOSIS — Z85828 Personal history of other malignant neoplasm of skin: Secondary | ICD-10-CM | POA: Insufficient documentation

## 2018-05-12 DIAGNOSIS — Z7901 Long term (current) use of anticoagulants: Secondary | ICD-10-CM | POA: Insufficient documentation

## 2018-05-12 DIAGNOSIS — R001 Bradycardia, unspecified: Secondary | ICD-10-CM | POA: Diagnosis not present

## 2018-05-12 NOTE — Progress Notes (Signed)
Primary Care Physician: [name], MD Referring Physician: Truitt Merle, NP Cardiologist: Dr. Verta Ellen is a 72 y.o. female with a h/o afib on flecainide that is being seen in afib clinic to discuss options other than flecanide. Pt was recently seen by Cecille Rubin when pt c/o of losing hair on flecainide and feeling fatigue. She has been staying in SR since starting drug and pt is happy for that as she was very symptomatic with paroxysmal afib. She has noted HR's in the 50's now as she was in the 60's before, but is not lightheaded with this. She does not smoke, use alcohol or snore. She is normal wight   Today, she denies symptoms of palpitations, chest pain, shortness of breath, orthopnea, PND, lower extremity edema, dizziness, presyncope, syncope, or neurologic sequela. The patient is tolerating medications without difficulties and is otherwise without complaint today.   Past Medical History:  Diagnosis Date  . Cancer ([place])    basal cell  . Cataract   . COLONIC POLYPS, ADENOMATOUS 09/22/2007   Qualifier: Diagnosis of  By: Ronnald Ramp RN, CGRN, Sheri    . Costochondritis    occ. flares, last 3-4 weeks ago.  . Depression    mild  . Dysphagia, pharyngoesophageal phase 01/20/2015  . Dysrhythmia    "palpitations"- occ.now-not frequent  . Heart murmur   . MITRAL VALVE PROLAPSE 09/22/2007   Qualifier: Diagnosis of  By: Ronnald Ramp RN, [place], [place]    . Multiple thyroid nodules    sees Dr. Conley Rolls every 6 months  . Palpitations   . Temporomandibular joint-pain-dysfunction syndrome (TMJ) 11/16/2010   Past Surgical History:  Procedure Laterality Date  . CATARACT EXTRACTION, BILATERAL    . COLONOSCOPY W/ POLYPECTOMY     last 12'15  . COSMETIC SURGERY     "facial"  . DILATION AND CURETTAGE OF UTERUS    . ESOPHAGOGASTRODUODENOSCOPY (EGD) WITH PROPOFOL N/A 01/20/2015   Procedure: ESOPHAGOGASTRODUODENOSCOPY (EGD) WITH PROPOFOL;  Surgeon: Lafayette Dragon, MD;  Location: [facility];   Service: Endoscopy;  Laterality: N/A;  . EXCISIONAL HEMORRHOIDECTOMY    . EYE SURGERY    . SAVORY DILATION N/A 01/20/2015   Procedure: SAVORY DILATION;  Surgeon: Lafayette Dragon, MD;  Location: [facility];  Service: Endoscopy;  Laterality: N/A;  . TUBAL LIGATION      Current Outpatient Medications  Medication Sig Dispense Refill  . ALPRAZolam (XANAX) 0.25 MG tablet Take 0.25 mg by mouth daily as needed for anxiety.    Marland Kitchen apixaban (ELIQUIS) 5 MG TABS tablet Take 1 tablet (5 mg total) by mouth 2 (two) times daily. 180 tablet 3  . atenolol (TENORMIN) 50 MG tablet TAKE 1 TABLET BY MOUTH TWICE DAILY 180 tablet 2  . B Complex Vitamins (VITAMIN B COMPLEX PO) Take 1 tablet by mouth once a day    . busPIRone (BUSPAR) 10 MG tablet take 1 tablet by mouth once daily 90 tablet 3  . calcium carbonate 200 MG capsule Take 1,800 mg by mouth daily.     . cholecalciferol (VITAMIN D) 1000 UNITS tablet Take 1,000 Units by mouth daily.      . flecainide (TAMBOCOR) 50 MG tablet Take 1 tablet (50 mg total) by mouth 2 (two) times daily. 60 tablet 3  . Multiple Vitamin (MULTIVITAMIN) tablet Take 1 tablet by mouth daily.      . pravastatin (PRAVACHOL) 20 MG tablet Take 20 mg by mouth daily.     No current facility-administered medications for  this encounter.     Allergies  Allergen Reactions  . Flecainide Other (See Comments)    Hair loss, high bp  . Augmentin [Amoxicillin-Pot Clavulanate] Nausea And Vomiting  . Vioxx [Rofecoxib]     palpitations    Social History   Socioeconomic History  . Marital status: Married    Spouse name: Not on file  . Number of children: Not on file  . Years of education: Not on file  . Highest education level: Not on file  Occupational History  . Not on file  Social Needs  . Financial resource strain: Not on file  . Food insecurity:    Worry: Not on file    Inability: Not on file  . Transportation needs:    Medical: Not on file    Non-medical: Not on file  Tobacco  Use  . Smoking status: Former Smoker    Types: Cigarettes  . Smokeless tobacco: Never Used  . Tobacco comment: social smoker -no use in 30 yrs  Substance and Sexual Activity  . Alcohol use: Yes    Comment: 2-3 times monthly shots of liquor/mixed drinks  . Drug use: No  . Sexual activity: Not on file  Lifestyle  . Physical activity:    Days per week: Not on file    Minutes per session: Not on file  . Stress: Not on file  Relationships  . Social connections:    Talks on phone: Not on file    Gets together: Not on file    Attends religious service: Not on file    Active member of club or organization: Not on file    Attends meetings of clubs or organizations: Not on file    Relationship status: Not on file  . Intimate partner violence:    Fear of current or ex partner: Not on file    Emotionally abused: Not on file    Physically abused: Not on file    Forced sexual activity: Not on file  Other Topics Concern  . Not on file  Social History Narrative  . Not on file    Family History  Problem Relation Age of Onset  . Kidney disease Father   . Cancer Father   . Diabetes Mother   . Diabetes Sister   . Diabetes Brother   . Cancer Brother   . Diabetes Sister   . Diabetes Brother     ROS- All systems are reviewed and negative except as per the HPI above  Physical Exam: Vitals:   05/12/18 0917  BP: 128/82  Pulse: (!) 50  Weight: 48.5 kg  Height: 5\' 3"  (1.6 m)   Wt Readings from Last 3 Encounters:  05/12/18 48.5 kg  05/07/18 48 kg  03/03/18 47.4 kg    Labs: Lab Results  Component Value Date   NA 141 03/24/2018   K 4.9 03/24/2018   CL 102 03/24/2018   CO2 25 03/24/2018   GLUCOSE 85 03/24/2018   BUN 11 03/24/2018   CREATININE 0.82 03/24/2018   CALCIUM 10.0 03/24/2018   MG 2.2 02/22/2018   No results found for: INR Lab Results  Component Value Date   CHOL 195 02/24/2018   HDL 81 02/24/2018   LDLCALC 84 02/24/2018   TRIG 150 (H) 02/24/2018     GEN-  The patient is well appearing, alert and oriented x 3 today.   Head- normocephalic, atraumatic Eyes-  Sclera clear, conjunctiva pink Ears- hearing intact Oropharynx- clear Neck- supple, no JVP Lymph-  no cervical lymphadenopathy Lungs- Clear to ausculation bilaterally, normal work of breathing Heart-slow, regular rate and rhythm, no murmurs, rubs or gallops, PMI not laterally displaced GI- soft, NT, ND, + BS Extremities- no clubbing, cyanosis, or edema MS- no significant deformity or atrophy Skin- no rash or lesion Psych- euthymic mood, full affect Neuro- strength and sensation are intact  EKG-Sinus brady at 50 bpm, pr int 192 ms, qrs int 96 ms, qtc 410 ms Echo-Study Conclusions  - Left ventricle: The cavity size was normal. Wall thickness was   normal. Systolic function was normal. The estimated ejection   fraction was in the range of 55% to 60%. Wall motion was normal;   there were no regional wall motion abnormalities. Left   ventricular diastolic function parameters were normal. - Aortic valve: There was mild to moderate regurgitation. - Mitral valve: There was mild regurgitation.  Impressions:  - Normal LV function; mild to moderate AI; mild MR and TR.    Assessment and Plan: 1.Paraoxysmal afib Staying in rhythm on flecainide 50 mg bid but is afraid it is making her tired and hair to fall out Discussed with her other options, including other AAD's and or ablation or reducing flecainide and/ or atenolol Pt is afraid to change dose of drugs at this point as she is afraid she will go back in afib She would like to discuss further with Dr. Rayann Heman pursing ablation Continue apixaban 5 mg bid fro a CHA2DS2VASc of 2  I will refer to Dr. Lawrence Marseilles C. Hudsyn Barich, [facility] [address] [place], [place] [address] [phone]

## 2018-05-21 ENCOUNTER — Ambulatory Visit: Payer: BLUE CROSS/BLUE SHIELD | Admitting: Internal Medicine

## 2018-05-21 ENCOUNTER — Encounter: Payer: Self-pay | Admitting: Internal Medicine

## 2018-05-21 VITALS — BP 140/74 | HR 56 | Ht 63.0 in | Wt 105.0 lb

## 2018-05-21 DIAGNOSIS — I480 Paroxysmal atrial fibrillation: Secondary | ICD-10-CM

## 2018-05-21 MED ORDER — FLECAINIDE ACETATE 50 MG PO TABS: 25.0000 mg | ORAL_TABLET | Freq: Two times a day (BID) | ORAL | 3 refills | Status: DC

## 2018-05-21 NOTE — Progress Notes (Signed)
Electrophysiology Office Note   Date:  05/21/2018   ID:  Robin Collins, Robin Collins Sep 12, 1945, MRN [id number]  PCP:  Robin Cruel, MD  Primary Cardiologist:  Dr Robin Collins (previously Dr Mare Ferrari) Primary Electrophysiologist: Robin Grayer, MD    CC: afib   History of Present Illness: Robin Collins is a 72 y.o. female who presents today for electrophysiology evaluation.   She is referred by Robin Palau NP for EP consultation regarding afib.  She has been primarily followed by Robin Collins.  I don't see that I have seen her previously.  She was diagnosed with afib in July.  In retrospect, she has had palpitations for several years, likely due to afib.  Prior monitors revealed only PVCs. She has been placed on flecainide and eliquis.  Her afib is improved, however she is worried about possible alopecia with this medicine.  She also feels "drugged" on flecainide and states that it makes her feel "aweful".  Today, she denies symptoms of chest pain, shortness of breath, orthopnea, PND, lower extremity edema, claudication, dizziness, presyncope, syncope, bleeding, or neurologic sequela. The patient is tolerating medications without difficulties and is otherwise without complaint today.    Past Medical History:  Diagnosis Date  . Cancer ([place])    basal cell  . Cataract   . COLONIC POLYPS, ADENOMATOUS 09/22/2007   Qualifier: Diagnosis of  By: Robin Ramp RN, CGRN, Robin Collins    . Costochondritis    occ. flares, last 3-4 weeks ago.  . Depression    mild  . Dysphagia, pharyngoesophageal phase 01/20/2015  . Dysrhythmia    "palpitations"- occ.now-not frequent  . Heart murmur   . MITRAL VALVE PROLAPSE 09/22/2007   Qualifier: Diagnosis of  By: Robin Ramp RN, Robin Collins, Robin Collins    . Multiple thyroid nodules    sees Dr. Conley Collins every 6 months  . Palpitations   . Temporomandibular joint-pain-dysfunction syndrome (TMJ) 11/16/2010   Past Surgical History:  Procedure Laterality Date  . CATARACT EXTRACTION,  BILATERAL    . COLONOSCOPY W/ POLYPECTOMY     last 12'15  . COSMETIC SURGERY     "facial"  . DILATION AND CURETTAGE OF UTERUS    . ESOPHAGOGASTRODUODENOSCOPY (EGD) WITH PROPOFOL N/A 01/20/2015   Procedure: ESOPHAGOGASTRODUODENOSCOPY (EGD) WITH PROPOFOL;  Surgeon: Lafayette Dragon, MD;  Location: [facility];  Service: Endoscopy;  Laterality: N/A;  . EXCISIONAL HEMORRHOIDECTOMY    . EYE SURGERY    . SAVORY DILATION N/A 01/20/2015   Procedure: SAVORY DILATION;  Surgeon: Lafayette Dragon, MD;  Location: [facility];  Service: Endoscopy;  Laterality: N/A;  . TUBAL LIGATION       Current Outpatient Medications  Medication Sig Dispense Refill  . ALPRAZolam (XANAX) 0.25 MG tablet Take 0.25 mg by mouth daily as needed for anxiety.    Marland Kitchen apixaban (ELIQUIS) 5 MG TABS tablet Take 1 tablet (5 mg total) by mouth 2 (two) times daily. 180 tablet 3  . atenolol (TENORMIN) 50 MG tablet TAKE 1 TABLET BY MOUTH TWICE DAILY 180 tablet 2  . B Complex Vitamins (VITAMIN B COMPLEX PO) Take 1 tablet by mouth once a day    . busPIRone (BUSPAR) 10 MG tablet take 1 tablet by mouth once daily 90 tablet 3  . calcium carbonate 200 MG capsule Take 1,800 mg by mouth daily.     . cholecalciferol (VITAMIN D) 1000 UNITS tablet Take 1,000 Units by mouth daily.      . flecainide (TAMBOCOR) 50 MG tablet Take 1 tablet (50  mg total) by mouth 2 (two) times daily. 60 tablet 3  . Multiple Vitamin (MULTIVITAMIN) tablet Take 1 tablet by mouth daily.      . pravastatin (PRAVACHOL) 20 MG tablet Take 20 mg by mouth daily.     No current facility-administered medications for this visit.     Allergies:   Flecainide; Augmentin [amoxicillin-pot clavulanate]; and Vioxx [rofecoxib]   Social History:  The patient  reports that she has quit smoking. Her smoking use included cigarettes. She has never used smokeless tobacco. She reports that she drinks alcohol. She reports that she does not use drugs.   Family History:  The patient's  family  history includes Cancer in her brother and father; Diabetes in her brother, brother, mother, sister, and sister; Kidney disease in her father.    ROS:  Please see the history of present illness.   All other systems are personally reviewed and negative.    PHYSICAL EXAM: VS:  BP 140/74   Pulse (!) 56   Ht 5\' 3"  (1.6 m)   Wt 105 lb (47.6 kg)   SpO2 97%   BMI 18.60 kg/m  , BMI Body mass index is 18.6 kg/m. GEN: Well nourished, well developed, in no acute distress  HEENT: normal  Neck: no JVD, carotid bruits, or masses Cardiac: RRR; no murmurs, rubs, or gallops,no edema  Respiratory:  clear to auscultation bilaterally, normal work of breathing GI: soft, nontender, nondistended, + BS MS: no deformity or atrophy  Skin: warm and dry  Neuro:  Strength and sensation are intact Psych: euthymic mood, full affect  EKG:  EKG is ordered today. The ekg ordered today is personally reviewed and shows sinus rhythm 56 bpm, PR 188 msec, QRS 100 msec, QTc 430 msec, otherwise normal ekg   Recent Labs: 02/22/2018: Magnesium 2.2 02/24/2018: ALT 20 03/24/2018: BUN 11; Creatinine, Ser 0.82; Hemoglobin 13.2; Platelets 239; Potassium 4.9; Sodium 141  personally reviewed   Lipid Panel     Component Value Date/Time   CHOL 195 02/24/2018 0859   TRIG 150 (H) 02/24/2018 0859   HDL 81 02/24/2018 0859   CHOLHDL 2.4 02/24/2018 0859   LDLCALC 84 02/24/2018 0859     Wt Readings from Last 3 Encounters:  05/21/18 105 lb (47.6 kg)  05/12/18 107 lb (48.5 kg)  05/07/18 105 lb 12.8 oz (48 kg)    Other studies personally reviewed: Additional studies/ records that were reviewed today include:  AF clinic notes and Robin Collins' notes reviewed, echo 03/11/18 Review of the above records today demonstrates: EF 55%, mild to moderate AI, mild MR   ASSESSMENT AND PLAN:  1.  Paroxysmal atrial fibrillation The patient has symptomatic, recurrent paroxysmal atrial fibrillation. she has failed medical therapy with  flecainide due to side effects. Chads2vasc score is 4.  she is anticoagulated with eliquis . Therapeutic strategies for afib including medicine (rhythmol, multaq, sotalol, tikosyn) and ablation were discussed in detail with the patient today. Risk, benefits, and alternatives to EP study and radiofrequency ablation for afib were also discussed in detail today. These risks include but are not limited to stroke, bleeding, vascular damage, tamponade, perforation, damage to the esophagus, lungs, and other structures, pulmonary vein stenosis, worsening renal function, and death. The patient understands these risk and wishes to think about this further.  She will contact our office if she decides to proceed.  For now, she will try flecainide 25mg  BID.  rhythmol would also be a good alternative, though I would reduce atenolol to  50mg  once daily should we try this option.  Follow-up:  Return in 3 months to see me.  She will contact my office if she decides to proceed with ablation in the interim.  Current medicines are reviewed at length with the patient today.   The patient does not have concerns regarding her medicines.  The following changes were made today:  none    Signed, Cousins Grayer, MD  05/21/2018 9:05 AM     [facility] [address] [place] [address] [phone] (office) [phone] (fax)

## 2018-05-21 NOTE — Patient Instructions (Addendum)
Medication Instructions:  Your physician has recommended you make the following change in your medication:   1.  Decrease your flecainide 50 mg tablet--Take 1/2 tablet by mouth twice a day  If you need a refill on your cardiac medications before your next appointment, please call your pharmacy.   Lab work: None ordered.  If you have labs (blood work) drawn today and your tests are completely normal, you will receive your results only by: Marland Kitchen MyChart Message (if you have MyChart) OR . A paper copy in the mail If you have any lab test that is abnormal or we need to change your treatment, we will call you to review the results.  Testing/Procedures: None ordered.  Follow-Up:  Your physician wants you to follow-up in:  3 months with Dr. Rayann Heman.       At [facility], you and your health needs are our priority.  As part of our continuing mission to provide you with exceptional heart care, we have created designated Provider Care Teams.  These Care Teams include your primary Cardiologist (physician) and Advanced Practice Providers (APPs -  Physician Assistants and Nurse Practitioners) who all work together to provide you with the care you need, when you need it.  Any Other Special Instructions Will Be Listed Below (If Applicable).

## 2018-05-23 NOTE — Addendum Note (Signed)
Addended by: Rose Phi on: 05/23/2018 09:30 AM   Modules accepted: Orders

## 2018-05-26 ENCOUNTER — Ambulatory Visit: Payer: BLUE CROSS/BLUE SHIELD | Admitting: Cardiology

## 2018-05-26 ENCOUNTER — Encounter: Payer: Self-pay | Admitting: Cardiology

## 2018-05-26 VITALS — BP 142/72 | HR 55 | Ht 63.0 in | Wt 106.0 lb

## 2018-05-26 DIAGNOSIS — R002 Palpitations: Secondary | ICD-10-CM

## 2018-05-26 DIAGNOSIS — I480 Paroxysmal atrial fibrillation: Secondary | ICD-10-CM

## 2018-05-26 DIAGNOSIS — Z87898 Personal history of other specified conditions: Secondary | ICD-10-CM

## 2018-05-26 DIAGNOSIS — I351 Nonrheumatic aortic (valve) insufficiency: Secondary | ICD-10-CM

## 2018-05-26 NOTE — Patient Instructions (Addendum)
  Medication Instructions:  The current medical regimen is effective;  continue present plan and medications.  If you need a refill on your cardiac medications before your next appointment, please call your pharmacy.   Follow-Up: At [facility], you and your health needs are our priority.  As part of our continuing mission to provide you with exceptional heart care, we have created designated Provider Care Teams.  These Care Teams include your primary Cardiologist (physician) and Advanced Practice Providers (APPs -  Physician Assistants and Nurse Practitioners) who all work together to provide you with the care you need, when you need it. You will need a follow up appointment in 6 months.  Please call our office 2 months in advance to schedule this appointment.  You may see Dr Marlou Porch or one of the following Advanced Practice Providers on your designated Care Team:   Truitt Merle, NP Cecilie Kicks, NP . Kathyrn Drown, NP  Thank you for choosing [facility]!!

## 2018-05-26 NOTE — Progress Notes (Signed)
Cardiology Office Note:    Date:  05/26/2018   ID:  Robin Collins, DOB 04/01/1946, MRN [id number]  PCP:  [name], MD  Cardiologist:  Candee Furbish, MD  Electrophysiologist:  None   Referring MD: [name], MD     History of Present Illness:    Robin Collins is a 72 y.o. female here for follow-up for atrial fibrillation.  Has been seen by Dr. Rayann Heman as well as Roderic Palau, Truitt Merle.  First episode May 10th. Lost use of left arm for 3 hours after that.  Diagnosed back in July 2019 10 hours with atrial fibrillation but she has had palpitations for many many years.  She was placed on flecainide as well as Eliquis which she is worried about alopecia with the medication.  She also feels fairly drugged on the flecainide makes her feel awful according to recent note on 05/21/2018 by Dr. Rayann Heman.  Now she is taking 25 mg of flecainide twice a day and is feeling much better.  No chest pain fevers chills nausea vomiting shortness of breath bleeding.  Merry Proud, husband.   Past Medical History:  Diagnosis Date  . Cancer ([place])    basal cell  . Cataract   . COLONIC POLYPS, ADENOMATOUS 09/22/2007   Qualifier: Diagnosis of  By: Ronnald Ramp RN, CGRN, [place]    . Costochondritis    occ. flares, last 3-4 weeks ago.  . Depression    mild  . Dysphagia, pharyngoesophageal phase 01/20/2015  . Dysrhythmia    "palpitations"- occ.now-not frequent  . Heart murmur   . MITRAL VALVE PROLAPSE 09/22/2007   Qualifier: Diagnosis of  By: Ronnald Ramp RN, [place], [place]    . Multiple thyroid nodules    sees Dr. Conley Rolls every 6 months  . Palpitations   . Temporomandibular joint-pain-dysfunction syndrome (TMJ) 11/16/2010    Past Surgical History:  Procedure Laterality Date  . CATARACT EXTRACTION, BILATERAL    . COLONOSCOPY W/ POLYPECTOMY     last 12'15  . COSMETIC SURGERY     "facial"  . DILATION AND CURETTAGE OF UTERUS    . ESOPHAGOGASTRODUODENOSCOPY (EGD) WITH PROPOFOL N/A 01/20/2015   Procedure: ESOPHAGOGASTRODUODENOSCOPY (EGD) WITH PROPOFOL;  Surgeon: Lafayette Dragon, MD;  Location: [facility];  Service: Endoscopy;  Laterality: N/A;  . EXCISIONAL HEMORRHOIDECTOMY    . EYE SURGERY    . SAVORY DILATION N/A 01/20/2015   Procedure: SAVORY DILATION;  Surgeon: Lafayette Dragon, MD;  Location: [facility];  Service: Endoscopy;  Laterality: N/A;  . TUBAL LIGATION      Current Medications: Current Meds  Medication Sig  . ALPRAZolam (XANAX) 0.25 MG tablet Take 0.25 mg by mouth daily as needed for anxiety.  Marland Kitchen apixaban (ELIQUIS) 5 MG TABS tablet Take 1 tablet (5 mg total) by mouth 2 (two) times daily.  Marland Kitchen atenolol (TENORMIN) 50 MG tablet TAKE 1 TABLET BY MOUTH TWICE DAILY  . B Complex Vitamins (VITAMIN B COMPLEX PO) Take 1 tablet by mouth once a day  . busPIRone (BUSPAR) 10 MG tablet take 1 tablet by mouth once daily  . calcium carbonate 200 MG capsule Take 1,800 mg by mouth daily.   . cholecalciferol (VITAMIN D) 1000 UNITS tablet Take 1,000 Units by mouth daily.    . flecainide (TAMBOCOR) 50 MG tablet Take 0.5 tablets (25 mg total) by mouth 2 (two) times daily.  . Multiple Vitamin (MULTIVITAMIN) tablet Take 1 tablet by mouth daily.    . pravastatin (PRAVACHOL) 20 MG tablet Take  20 mg by mouth daily.     Allergies:   Flecainide; Augmentin [amoxicillin-pot clavulanate]; and Vioxx [rofecoxib]   Social History   Socioeconomic History  . Marital status: Married    Spouse name: Not on file  . Number of children: Not on file  . Years of education: Not on file  . Highest education level: Not on file  Occupational History  . Not on file  Social Needs  . Financial resource strain: Not on file  . Food insecurity:    Worry: Not on file    Inability: Not on file  . Transportation needs:    Medical: Not on file    Non-medical: Not on file  Tobacco Use  . Smoking status: Former Smoker    Types: Cigarettes  . Smokeless tobacco: Never Used  . Tobacco comment: social smoker -no use  in 30 yrs  Substance and Sexual Activity  . Alcohol use: Yes    Comment: 2-3 times monthly shots of liquor/mixed drinks  . Drug use: No  . Sexual activity: Not on file  Lifestyle  . Physical activity:    Days per week: Not on file    Minutes per session: Not on file  . Stress: Not on file  Relationships  . Social connections:    Talks on phone: Not on file    Gets together: Not on file    Attends religious service: Not on file    Active member of club or organization: Not on file    Attends meetings of clubs or organizations: Not on file    Relationship status: Not on file  Other Topics Concern  . Not on file  Social History Narrative  . Not on file     Family History: The patient's family history includes Cancer in her brother and father; Diabetes in her brother, brother, mother, sister, and sister; Kidney disease in her father.  ROS:   Please see the history of present illness.     All other systems reviewed and are negative.  EKGs/Labs/Other Studies Reviewed:    The following studies were reviewed today:  Exercise treadmill test August 2019 -No concerning EKG changes with flecanide use.  Echo Study Conclusions 02/2018  - Left ventricle: The cavity size was normal. Wall thickness was normal. Systolic function was normal. The estimated ejection fraction was in the range of 55% to 60%. Wall motion was normal; there were no regional wall motion abnormalities. Left ventricular diastolic function parameters were normal. - Aortic valve: There was mild to moderate regurgitation. - Mitral valve: There was mild regurgitation.  Impressions:  - Normal LV function; mild to moderate AI; mild MR and TR.  EKG: EKG from 05/21/2018 shows sinus rhythm 56 PR 188, QRS 100, QTc 430.  Personally reviewed and interpreted.  No significant change from prior.  Recent Labs: 02/22/2018: Magnesium 2.2 02/24/2018: ALT 20 03/24/2018: BUN 11; Creatinine, Ser 0.82; Hemoglobin 13.2;  Platelets 239; Potassium 4.9; Sodium 141  Recent Lipid Panel    Component Value Date/Time   CHOL 195 02/24/2018 0859   TRIG 150 (H) 02/24/2018 0859   HDL 81 02/24/2018 0859   CHOLHDL 2.4 02/24/2018 0859   LDLCALC 84 02/24/2018 0859    Physical Exam:    VS:  BP (!) 142/72   Pulse (!) 55   Ht 5\' 3"  (1.6 m)   Wt 106 lb (48.1 kg)   SpO2 99%   BMI 18.78 kg/m     Wt Readings from Last 3 Encounters:  05/26/18 106 lb (48.1 kg)  05/21/18 105 lb (47.6 kg)  05/12/18 107 lb (48.5 kg)     GEN:  Thin Well nourished, well developed in no acute distress HEENT: Normal NECK: No JVD; No carotid bruits LYMPHATICS: No lymphadenopathy CARDIAC: RRR, no murmurs, rubs, gallops RESPIRATORY:  Clear to auscultation without rales, wheezing or rhonchi  ABDOMEN: Soft, non-tender, non-distended MUSCULOSKELETAL:  No edema; No deformity  SKIN: Warm and dry NEUROLOGIC:  Alert and oriented x 3 PSYCHIATRIC:  Normal affect   ASSESSMENT:    1. PAF (paroxysmal atrial fibrillation) (HCC)   2. Palpitations   3. Nonrheumatic aortic valve insufficiency   4. History of chest pain    PLAN:    In order of problems listed above:  Paroxysmal atrial fibrillation - July 2019 diagnosed A. fib in ER.  TIA 2 months earlier.  Eliquis. - Chads vascular score 4, anticoagulated with Eliquis.  Lengthy discussion with Dr. Rayann Heman with medical versus ablative therapy.  She wanted to think about this further.  She will contact him if she decides to move forward.  He was going to try flecanide 25 mg twice a day.  She thinks that this seems to be helping out and she feels much better.  I told her that if she does have an episode of A. fib that she may take 100 mg of flecainide pill in the pocket approach.  Likely, ablation is an option for her in the future.  I told her to be optimistic about all of her choices.  Also thought that Rythmol would be a good alternative although he would reduce atenolol to 50 mg once a day if  decided to choose that option.  Regardless, has a return to clinic follow-up in January to see him.  Mild aortic insufficiency -Should be of no clinical consequence.  Palpitations -Many years, possibly A. Fib.  Atypical chest pain -Has been diagnosed with costochondritis in the past.  PVCs - This has been documented on event monitor back in October 2017.  Hyperlipidemia  - pravastatin  Medication Adjustments/Labs and Tests Ordered: Current medicines are reviewed at length with the patient today.  Concerns regarding medicines are outlined above.  No orders of the defined types were placed in this encounter.  No orders of the defined types were placed in this encounter.   Patient Instructions   Medication Instructions:  The current medical regimen is effective;  continue present plan and medications.  If you need a refill on your cardiac medications before your next appointment, please call your pharmacy.   Follow-Up: At [facility], you and your health needs are our priority.  As part of our continuing mission to provide you with exceptional heart care, we have created designated Provider Care Teams.  These Care Teams include your primary Cardiologist (physician) and Advanced Practice Providers (APPs -  Physician Assistants and Nurse Practitioners) who all work together to provide you with the care you need, when you need it. You will need a follow up appointment in 6 months.  Please call our office 2 months in advance to schedule this appointment.  You may see Dr Marlou Porch or one of the following Advanced Practice Providers on your designated Care Team:   Truitt Merle, NP Cecilie Kicks, NP . Kathyrn Drown, NP  Thank you for choosing [facility]!!          Signed, Candee Furbish, MD  05/26/2018 9:15 AM    [place]

## 2018-06-09 DIAGNOSIS — M5412 Radiculopathy, cervical region: Secondary | ICD-10-CM | POA: Diagnosis not present

## 2018-06-09 DIAGNOSIS — M542 Cervicalgia: Secondary | ICD-10-CM | POA: Diagnosis not present

## 2018-06-19 DIAGNOSIS — Z862 Personal history of diseases of the blood and blood-forming organs and certain disorders involving the immune mechanism: Secondary | ICD-10-CM | POA: Diagnosis not present

## 2018-06-19 DIAGNOSIS — M810 Age-related osteoporosis without current pathological fracture: Secondary | ICD-10-CM | POA: Diagnosis not present

## 2018-06-19 DIAGNOSIS — E042 Nontoxic multinodular goiter: Secondary | ICD-10-CM | POA: Diagnosis not present

## 2018-06-19 DIAGNOSIS — E7800 Pure hypercholesterolemia, unspecified: Secondary | ICD-10-CM | POA: Diagnosis not present

## 2018-06-24 DIAGNOSIS — Z862 Personal history of diseases of the blood and blood-forming organs and certain disorders involving the immune mechanism: Secondary | ICD-10-CM | POA: Diagnosis not present

## 2018-06-24 DIAGNOSIS — M810 Age-related osteoporosis without current pathological fracture: Secondary | ICD-10-CM | POA: Diagnosis not present

## 2018-06-24 DIAGNOSIS — E7800 Pure hypercholesterolemia, unspecified: Secondary | ICD-10-CM | POA: Diagnosis not present

## 2018-06-24 DIAGNOSIS — E042 Nontoxic multinodular goiter: Secondary | ICD-10-CM | POA: Diagnosis not present

## 2018-07-10 ENCOUNTER — Ambulatory Visit: Payer: BLUE CROSS/BLUE SHIELD | Attending: Family Medicine | Admitting: Physical Therapy

## 2018-07-10 ENCOUNTER — Other Ambulatory Visit: Payer: Self-pay

## 2018-07-10 ENCOUNTER — Encounter: Payer: Self-pay | Admitting: Physical Therapy

## 2018-07-10 DIAGNOSIS — M6281 Muscle weakness (generalized): Secondary | ICD-10-CM | POA: Diagnosis not present

## 2018-07-10 DIAGNOSIS — M542 Cervicalgia: Secondary | ICD-10-CM | POA: Diagnosis not present

## 2018-07-10 DIAGNOSIS — R293 Abnormal posture: Secondary | ICD-10-CM | POA: Diagnosis not present

## 2018-07-10 NOTE — Therapy (Signed)
[facility] [address], [place], [place], [address] Phone: [phone]   Fax:  [phone]  Physical Therapy Evaluation  Patient Details  Name: Robin Collins MRN: [id number] Date of Birth: 1946-05-05 Referring Provider (PT): Dr. Lorna Few   Encounter Date: 07/10/2018  PT End of Session - 07/10/18 1526    Visit Number  1    Date for PT Re-Evaluation  09/04/18    Authorization Type  Medicare    PT Start Time  1100    PT Stop Time  1143    PT Time Calculation (min)  43 min    Activity Tolerance  Patient tolerated treatment well       Past Medical History:  Diagnosis Date  . Cancer ([place])    basal cell  . Cataract   . COLONIC POLYPS, ADENOMATOUS 09/22/2007   Qualifier: Diagnosis of  By: Ronnald Ramp RN, CGRN, [place]    . Costochondritis    occ. flares, last 3-4 weeks ago.  . Depression    mild  . Dysphagia, pharyngoesophageal phase 01/20/2015  . Dysrhythmia    "palpitations"- occ.now-not frequent  . Heart murmur   . MITRAL VALVE PROLAPSE 09/22/2007   Qualifier: Diagnosis of  By: Ronnald Ramp RN, [place], [place]    . Multiple thyroid nodules    sees Dr. Conley Rolls every 6 months  . Palpitations   . Temporomandibular joint-pain-dysfunction syndrome (TMJ) 11/16/2010    Past Surgical History:  Procedure Laterality Date  . CATARACT EXTRACTION, BILATERAL    . COLONOSCOPY W/ POLYPECTOMY     last 12'15  . COSMETIC SURGERY     "facial"  . DILATION AND CURETTAGE OF UTERUS    . ESOPHAGOGASTRODUODENOSCOPY (EGD) WITH PROPOFOL N/A 01/20/2015   Procedure: ESOPHAGOGASTRODUODENOSCOPY (EGD) WITH PROPOFOL;  Surgeon: Lafayette Dragon, MD;  Location: [facility];  Service: Endoscopy;  Laterality: N/A;  . EXCISIONAL HEMORRHOIDECTOMY    . EYE SURGERY    . SAVORY DILATION N/A 01/20/2015   Procedure: SAVORY DILATION;  Surgeon: Lafayette Dragon, MD;  Location: [facility];  Service: Endoscopy;  Laterality: N/A;  . TUBAL LIGATION      There  were no vitals filed for this visit.   Subjective Assessment - 07/10/18 1106    Subjective  Several month history of neck pain particularly with sleeping and in the AM.  Worsened in Oct/Nov.  OTC didn't help.  In November had a miserable weekend while out of town after watching a football game.  I could not lift my head off the pillow!  I had to prop myself with pillows.  Right neck and upper trap region and my head would jerk.  Took muscle relaxer which helped.  Neck and shoulder are better.  Still sleeping on 2 pillow.     Limitations  House hold activities    Diagnostic tests  x-rays     Patient Stated Goals  alleviate neck pain,  not sleep on multiple pillows    Currently in Pain?  Yes    Pain Score  0-No pain    Pain Location  Neck    Pain Orientation  Right    Pain Type  Chronic pain    Aggravating Factors   night and mornings    Pain Relieving Factors  Tylenol         [facility] PT Assessment - 07/10/18 0001      Assessment   Medical Diagnosis  cervicalgia    Referring Provider (PT)  Dr. Lorna Few  Onset Date/Surgical Date  --   October   Hand Dominance  Right    Next MD Visit  as needed    Prior Therapy  shoulder after a fall; TMJ      Precautions   Precautions  None      Restrictions   Weight Bearing Restrictions  No      Balance Screen   Has the patient fallen in the past 6 months  No    Has the patient had a decrease in activity level because of a fear of falling?   No    Is the patient reluctant to leave their home because of a fear of falling?   No      Home Film/video editor residence      Prior Function   Level of Independence  Independent    Leisure  go to house in [place] ;  walking, reading       Observation/Other Assessments   Focus on Therapeutic Outcomes (FOTO)   37% limitation       Posture/Postural Control   Posture/Postural Control  Postural limitations    Posture Comments  increased thoracic kyphosis       AROM    Cervical Flexion  73   pain   Cervical Extension  50    Cervical - Right Side Bend  35    Cervical - Left Side Bend  22    Cervical - Right Rotation  40    Cervical - Left Rotation  35      Strength   Right/Left Shoulder  --   grossly 4+/5;  interscapular muscles 4/5   Cervical Flexion  3+/5    Cervical Extension  3+/5    Cervical - Right Side Bend  3+/5    Cervical - Left Side Bend  3+/5    Cervical - Right Rotation  3+/5    Cervical - Left Rotation  3+/5      Palpation   Palpation comment  right upper trap tender points                Objective measurements completed on examination: See above findings.              PT Education - 07/10/18 1523    Education Details  cervical isometrics, supine deep cervical flexor activation ;  dry needling info  Access code:  [email or web address]    Person(s) Educated  Patient    Methods  Explanation;Demonstration;Handout    Comprehension  Returned demonstration;Verbalized understanding       PT Short Term Goals - 07/10/18 1538      PT SHORT TERM GOAL #1   Title  The patient will demonstrate compliance with initial HEP and postural correction to promote recovery     Time  4    Period  Weeks    Status  New    Target Date  08/07/18      PT SHORT TERM GOAL #2   Title  The patient will report a 30% improvement in neck pain with usual ADLs including sleep and lifting head off the pillow    Time  4    Period  Weeks    Status  New      PT SHORT TERM GOAL #3   Title  The patient will have improved cervical sidebending bilaterally to 35 degrees and rotation to 40 degrees needed for driving and usual ADLs    Time  4  Period  Weeks    Status  New        PT Long Term Goals - 07/10/18 1545      PT LONG TERM GOAL #1   Title  The patient will be independent with safe self progression of HEP     Time  8    Period  Weeks    Status  New    Target Date  09/04/18      PT LONG TERM GOAL #2   Title  The patient will report  a 60% improvement in neck pain at night, mornings and with lifting her head off the pillow.      Time  8    Period  Weeks    Status  New      PT LONG TERM GOAL #3   Title  The patient will have full cervical ROM and symmetrical sidebending and rotation right/left    Time  8    Period  Weeks    Status  New      PT LONG TERM GOAL #4   Title  The patient will have cervical and interscapular muscle strength grossly 4-/5 needed for carrying objects, lifting medium objects overhead and lifting her head off the pillow    Time  8    Period  Weeks    Status  New      PT LONG TERM GOAL #5   Title  FOTO functional outcome score improved from 37% limitation to 32% indicating improved function with less pain    Time  8    Period  Weeks    Status  New             Plan - 07/10/18 1524    Clinical Impression Statement  Several month history of neck pain particularly with sleeping and in the AM.  The pain worsened in November so much so that she could not lift her head off the pillows and very limited ROM.  Right neck and upper trap region and "my head would jerk".   Neck and shoulder are better but still sleeping on 2 pillows and having difficulty lifting her head up off just 1 pillow.  Increased thoracic kyphosis.  Decreased cervical ROM;  extension 50 degrees, right sidebending 35, left sidebending 22, right rotation 40 degrees, left rotation 35 degrees.  UE ROM WFLs.  She has a tender point in upper trapezius muscle. Decreased cervical strength grossly 3+/5.  She is able to lift her head off the pillow but with difficulty.  She is fearful of exercise.  She would benefit from PT to address these deficits.      History and Personal Factors relevant to plan of care:  minimal co-morbidities;  good psychosocial support    Clinical Presentation  Stable    Clinical Decision Making  Low    Rehab Potential  Good    PT Frequency  2x / week    PT Duration  8 weeks    PT Treatment/Interventions   ADLs/Self Care Home Management;Cryotherapy;Electrical Stimulation;Ultrasound;Moist Heat;Therapeutic activities;Therapeutic exercise;Patient/family education;Neuromuscular re-education;Manual techniques;Dry needling    PT Next Visit Plan  possible DN to right upper trap;  review initial HEP;  cervical strengthening;  interscapular strengthening;  manual techniques;  modalities as needed     PT Home Exercise Plan  Access code:  [email or web address]    Consulted and Agree with Plan of Care  Patient       Patient will benefit from skilled therapeutic intervention in  order to improve the following deficits and impairments:  Postural dysfunction, Pain, Increased fascial restricitons, Decreased range of motion, Decreased strength, Impaired perceived functional ability  Visit Diagnosis: Cervicalgia - Plan: PT plan of care cert/re-cert  Muscle weakness (generalized) - Plan: PT plan of care cert/re-cert  Abnormal posture - Plan: PT plan of care cert/re-cert     Problem List Patient Active Problem List   Diagnosis Date Noted  . Dysphagia, pharyngoesophageal phase 01/20/2015  . Costochondritis 06/29/2011  . Temporomandibular joint-pain-dysfunction syndrome (TMJ) 11/16/2010  . COLONIC POLYPS, ADENOMATOUS 09/22/2007  . THYROID DISORDER 09/22/2007  . MITRAL VALVE PROLAPSE 09/22/2007  . Palpitations 09/22/2007  . INTERNAL HEMORRHOIDS 07/02/2003   Ruben Im, PT 07/10/18 3:51 PM Phone: [phone] Fax: [phone]  Alvera Singh 07/10/2018, 3:50 PM  [facility] [address], [place], [place], [address] Phone: [phone]   Fax:  [phone]  Name: DAFFNEY GREENLY MRN: [id number] Date of Birth: September 18, 1945

## 2018-07-10 NOTE — Patient Instructions (Signed)
Medbridge Access code:  [email or web address] Cervical isometrics Supine deep cervical activation

## 2018-07-17 ENCOUNTER — Ambulatory Visit: Payer: BLUE CROSS/BLUE SHIELD | Admitting: Physical Therapy

## 2018-07-17 ENCOUNTER — Encounter: Payer: Self-pay | Admitting: Physical Therapy

## 2018-07-17 DIAGNOSIS — M542 Cervicalgia: Secondary | ICD-10-CM

## 2018-07-17 DIAGNOSIS — M6281 Muscle weakness (generalized): Secondary | ICD-10-CM

## 2018-07-17 DIAGNOSIS — R293 Abnormal posture: Secondary | ICD-10-CM

## 2018-07-17 NOTE — Patient Instructions (Signed)
Access Code: [email or web address]  URL: [email or web address]  Date: 07/17/2018  Prepared by: Earlie Counts   Exercises  Seated Isometric Cervical Rotation - 5 reps - 1 sets - 5 hold - 1x daily - 7x weekly  Seated Isometric Cervical Sidebending - 5 reps - 5 sets - 1x daily - 7x weekly  Seated Isometric Cervical Flexion - 5 reps - 1 sets - 5 hold - 1x daily - 7x weekly  Supine Deep Neck Flexor Nods - 10 reps - 1 sets - 1x daily - 7x weekly  Seated Cervical Retraction - 5 reps - 1 sets - 5sec hold - 3x daily - 7x weekly  [facility] [address] [place], [place] [address] Phone # [phone] Fax [phone]

## 2018-07-17 NOTE — Therapy (Signed)
[facility] [address] [place], [place], [address] Phone: [phone]   Fax:  [phone]  Physical Therapy Treatment  Patient Details  Name: Robin Collins MRN: [id number] Date of Birth: Apr 17, 1946 Referring Provider (PT): Dr. Lorna Few   Encounter Date: 07/17/2018  PT End of Session - 07/17/18 0841    Visit Number  2    Date for PT Re-Evaluation  09/04/18    Authorization Type  Medicare    PT Start Time  0800    PT Stop Time  0840    PT Time Calculation (min)  40 min    Activity Tolerance  Patient tolerated treatment well;No increased pain    Behavior During Therapy  WFL for tasks assessed/performed       Past Medical History:  Diagnosis Date  . Cancer ([place])    basal cell  . Cataract   . COLONIC POLYPS, ADENOMATOUS 09/22/2007   Qualifier: Diagnosis of  By: Ronnald Ramp RN, CGRN, [place]    . Costochondritis    occ. flares, last 3-4 weeks ago.  . Depression    mild  . Dysphagia, pharyngoesophageal phase 01/20/2015  . Dysrhythmia    "palpitations"- occ.now-not frequent  . Heart murmur   . MITRAL VALVE PROLAPSE 09/22/2007   Qualifier: Diagnosis of  By: Ronnald Ramp RN, [place], [place]    . Multiple thyroid nodules    sees Dr. Conley Rolls every 6 months  . Palpitations   . Temporomandibular joint-pain-dysfunction syndrome (TMJ) 11/16/2010    Past Surgical History:  Procedure Laterality Date  . CATARACT EXTRACTION, BILATERAL    . COLONOSCOPY W/ POLYPECTOMY     last 12'15  . COSMETIC SURGERY     "facial"  . DILATION AND CURETTAGE OF UTERUS    . ESOPHAGOGASTRODUODENOSCOPY (EGD) WITH PROPOFOL N/A 01/20/2015   Procedure: ESOPHAGOGASTRODUODENOSCOPY (EGD) WITH PROPOFOL;  Surgeon: Lafayette Dragon, MD;  Location: [facility];  Service: Endoscopy;  Laterality: N/A;  . EXCISIONAL HEMORRHOIDECTOMY    . EYE SURGERY    . SAVORY DILATION N/A 01/20/2015   Procedure: SAVORY DILATION;  Surgeon: Lafayette Dragon, MD;  [facility]: [facility];  Service: Endoscopy;  Laterality: N/A;  . TUBAL LIGATION      There were no vitals filed for this visit.  Subjective Assessment - 07/17/18 0802    Subjective  My pain is now in  my left shoulder instead of the right. I am back on the muscle relaxer. I had x rays of my neck and have bone spurs and DDD.     Limitations  House hold activities    Diagnostic tests  x-rays     Patient Stated Goals  alleviate neck pain,  not sleep on multiple pillows    Currently in Pain?  Yes    Pain Score  6     Pain Location  Neck    Pain Orientation  Left    Pain Descriptors / Indicators  Aching    Pain Type  Chronic pain    Pain Onset  More than a month ago    Pain Frequency  Intermittent    Aggravating Factors   worse in the night    Pain Relieving Factors  Tylenol    Multiple Pain Sites  No                       [facility] Adult PT Treatment/Exercise - 07/17/18 0001      Exercises   Exercises  Neck  Neck Exercises: Seated   Neck Retraction  5 reps;5 secs    Neck Retraction Limitations  tried isometric but increased left scapular pain    Cervical Rotation  Right;Left;Both   isometrics   Lateral Flexion  Right;Left;5 reps   isometrics     Manual Therapy   Manual Therapy  Soft tissue mobilization    Soft tissue mobilization  bil. cervical paraspinals, interscapular, suboccipitals, and upper trap to elongate tissue             PT Education - 07/17/18 0819    Education Details  Access Code: [email or web address]    Person(s) Educated  Patient    Methods  Explanation;Demonstration;Verbal cues;Handout    Comprehension  Returned demonstration;Verbalized understanding       PT Short Term Goals - 07/17/18 0844      PT SHORT TERM GOAL #1   Title  The patient will demonstrate compliance with initial HEP and postural correction to promote recovery     Time  4    Period  Weeks    Status  On-going      PT SHORT TERM GOAL #2   Title  The patient will report a 30%  improvement in neck pain with usual ADLs including sleep and lifting head off the pillow    Time  4    Period  Weeks    Status  On-going      PT SHORT TERM GOAL #3   Title  The patient will have improved cervical sidebending bilaterally to 35 degrees and rotation to 40 degrees needed for driving and usual ADLs    Time  4    Period  Weeks    Status  On-going        PT Long Term Goals - 07/10/18 1545      PT LONG TERM GOAL #1   Title  The patient will be independent with safe self progression of HEP     Time  8    Period  Weeks    Status  New    Target Date  09/04/18      PT LONG TERM GOAL #2   Title  The patient will report a 60% improvement in neck pain at night, mornings and with lifting her head off the pillow.      Time  8    Period  Weeks    Status  New      PT LONG TERM GOAL #3   Title  The patient will have full cervical ROM and symmetrical sidebending and rotation right/left    Time  8    Period  Weeks    Status  New      PT LONG TERM GOAL #4   Title  The patient will have cervical and interscapular muscle strength grossly 4-/5 needed for carrying objects, lifting medium objects overhead and lifting her head off the pillow    Time  8    Period  Weeks    Status  New      PT LONG TERM GOAL #5   Title  FOTO functional outcome score improved from 37% limitation to 32% indicating improved function with less pain    Time  8    Period  Weeks    Status  New            Plan - 07/17/18 7824    Clinical Impression Statement  Patient started to have more pain on the left and not right after last  treatment. Patient able to do her HEP correctly but stopped cervical extension isometrics due to increased trapezius pain. Patient will perform cervical retraction instead for HEP. Patient had trigger points in the cervical and interscapular area. Patient will benefit from skilled therapy to elongate tissue and improve function.     Rehab Potential  Good    PT Frequency  2x  / week    PT Duration  8 weeks    PT Treatment/Interventions  ADLs/Self Care Home Management;Cryotherapy;Electrical Stimulation;Ultrasound;Moist Heat;Therapeutic activities;Therapeutic exercise;Patient/family education;Neuromuscular re-education;Manual techniques;Dry needling    PT Next Visit Plan  possible DN to right upper trap;  cervical strengthening;  interscapular strengthening;  manual techniques;  modalities as needed     PT Home Exercise Plan  Access code:  [email or web address]    Recommended Other Services  MD signed initial eval    Consulted and Agree with Plan of Care  Patient       Patient will benefit from skilled therapeutic intervention in order to improve the following deficits and impairments:  Postural dysfunction, Pain, Increased fascial restricitons, Decreased range of motion, Decreased strength, Impaired perceived functional ability  Visit Diagnosis: Cervicalgia  Muscle weakness (generalized)  Abnormal posture     Problem List Patient Active Problem List   Diagnosis Date Noted  . Dysphagia, pharyngoesophageal phase 01/20/2015  . Costochondritis 06/29/2011  . Temporomandibular joint-pain-dysfunction syndrome (TMJ) 11/16/2010  . COLONIC POLYPS, ADENOMATOUS 09/22/2007  . THYROID DISORDER 09/22/2007  . MITRAL VALVE PROLAPSE 09/22/2007  . Palpitations 09/22/2007  . INTERNAL HEMORRHOIDS 07/02/2003    Earlie Counts, PT 07/17/18 8:45 AM   [facility] [address], [place], [place], [address] Phone: [phone]   Fax:  [phone]  Name: URIYAH MASSIMO MRN: [id number] Date of Birth: 11/02/1945

## 2018-07-24 ENCOUNTER — Encounter: Payer: Self-pay | Admitting: Physical Therapy

## 2018-07-24 ENCOUNTER — Ambulatory Visit: Payer: BLUE CROSS/BLUE SHIELD | Admitting: Physical Therapy

## 2018-07-24 DIAGNOSIS — M6281 Muscle weakness (generalized): Secondary | ICD-10-CM

## 2018-07-24 DIAGNOSIS — R293 Abnormal posture: Secondary | ICD-10-CM | POA: Diagnosis not present

## 2018-07-24 DIAGNOSIS — M542 Cervicalgia: Secondary | ICD-10-CM | POA: Diagnosis not present

## 2018-07-24 NOTE — Patient Instructions (Signed)

## 2018-07-24 NOTE — Therapy (Signed)
[facility] [address] [place], [place], [address] Phone: [phone]   Fax:  [phone]  Physical Therapy Treatment  Patient Details  Name: Robin Collins MRN: [id number] Date of Birth: 06/07/1946 Referring Provider (PT): Dr. Lorna Few   Encounter Date: 07/24/2018  PT End of Session - 07/24/18 0842    Visit Number  3    Date for PT Re-Evaluation  09/04/18    Authorization Type  Medicare    PT Start Time  0800    PT Stop Time  0840    PT Time Calculation (min)  40 min    Activity Tolerance  Patient tolerated treatment well;No increased pain    Behavior During Therapy  WFL for tasks assessed/performed       Past Medical History:  Diagnosis Date  . Cancer ([place])    basal cell  . Cataract   . COLONIC POLYPS, ADENOMATOUS 09/22/2007   Qualifier: Diagnosis of  By: Ronnald Ramp RN, CGRN, Sheri    . Costochondritis    occ. flares, last 3-4 weeks ago.  . Depression    mild  . Dysphagia, pharyngoesophageal phase 01/20/2015  . Dysrhythmia    "palpitations"- occ.now-not frequent  . Heart murmur   . MITRAL VALVE PROLAPSE 09/22/2007   Qualifier: Diagnosis of  By: Ronnald Ramp RN, [place], [place]    . Multiple thyroid nodules    sees Dr. Conley Rolls every 6 months  . Palpitations   . Temporomandibular joint-pain-dysfunction syndrome (TMJ) 11/16/2010    Past Surgical History:  Procedure Laterality Date  . CATARACT EXTRACTION, BILATERAL    . COLONOSCOPY W/ POLYPECTOMY     last 12'15  . COSMETIC SURGERY     "facial"  . DILATION AND CURETTAGE OF UTERUS    . ESOPHAGOGASTRODUODENOSCOPY (EGD) WITH PROPOFOL N/A 01/20/2015   Procedure: ESOPHAGOGASTRODUODENOSCOPY (EGD) WITH PROPOFOL;  Surgeon: Lafayette Dragon, MD;  [facility]: [facility];  Service: Endoscopy;  Laterality: N/A;  . EXCISIONAL HEMORRHOIDECTOMY    . EYE SURGERY    . SAVORY DILATION N/A 01/20/2015   Procedure: SAVORY DILATION;  Surgeon: Lafayette Dragon, MD;  [facility];  Service: Endoscopy;  Laterality: N/A;  . TUBAL LIGATION      There were no vitals filed for this visit.  Subjective Assessment - 07/24/18 0803    Subjective  I would like to have the dry needling. I am still getting up in the middle of the night due to pain.     Limitations  House hold activities    Diagnostic tests  x-rays     Patient Stated Goals  alleviate neck pain,  not sleep on multiple pillows    Currently in Pain?  Yes    Pain Score  7     Pain Location  Neck    Pain Orientation  Left    Pain Onset  More than a month ago    Pain Frequency  Intermittent    Aggravating Factors   worse in the night    Pain Relieving Factors  Tylenol    Multiple Pain Sites  No                       [facility] Adult PT Treatment/Exercise - 07/24/18 0001      Therapeutic Activites    Therapeutic Activities  Other Therapeutic Activities    Other Therapeutic Activities  posture in sitting and sleeping to reduce forward head      Neck Exercises:  Standing   Other Standing Exercises  bil. shoulder extension with tactile and verbal cues to bring scapular together      Neck Exercises: Supine   Neck Retraction  10 reps;5 secs    Neck Retraction Limitations  guiding head to neutral    Other Supine Exercise  foam performing melt method to subocciptals      Neck Exercises: Prone   Neck Retraction  10 reps;5 secs    Other Prone Exercise  scapular retraction holding 5 sec 10 times      Manual Therapy   Manual Therapy  Soft tissue mobilization    Soft tissue mobilization  thoracic interscapular area, under right scapula, subocciptials, cervical paraspinals       Trigger Point Dry Needling - 07/24/18 6578    Consent Given?  Yes    Education Handout Provided  Yes    Muscles Treated Upper Body  Rhomboids;Suboccipitals muscle group   cervial multfidi bil. at C3   SubOccipitals Response  Twitch response elicited;Palpable increased muscle length    Rhomboids Response  Twitch  response elicited;Palpable increased muscle length           PT Education - 07/24/18 0841    Education Details  information on dry needling    Person(s) Educated  Patient    Methods  Explanation;Verbal cues    Comprehension  Verbalized understanding       PT Short Term Goals - 07/24/18 0805      PT SHORT TERM GOAL #1   Title  The patient will demonstrate compliance with initial HEP and postural correction to promote recovery     Time  4    Period  Weeks    Status  On-going      PT SHORT TERM GOAL #2   Title  The patient will report a 30% improvement in neck pain with usual ADLs including sleep and lifting head off the pillow    Time  4    Period  Weeks    Status  On-going        PT Long Term Goals - 07/10/18 1545      PT LONG TERM GOAL #1   Title  The patient will be independent with safe self progression of HEP     Time  8    Period  Weeks    Status  New    Target Date  09/04/18      PT LONG TERM GOAL #2   Title  The patient will report a 60% improvement in neck pain at night, mornings and with lifting her head off the pillow.      Time  8    Period  Weeks    Status  New      PT LONG TERM GOAL #3   Title  The patient will have full cervical ROM and symmetrical sidebending and rotation right/left    Time  8    Period  Weeks    Status  New      PT LONG TERM GOAL #4   Title  The patient will have cervical and interscapular muscle strength grossly 4-/5 needed for carrying objects, lifting medium objects overhead and lifting her head off the pillow    Time  8    Period  Weeks    Status  New      PT LONG TERM GOAL #5   Title  FOTO functional outcome score improved from 37% limitation to 32% indicating improved function with  less pain    Time  8    Period  Weeks    Status  New            Plan - 07/24/18 0800    Clinical Impression Statement  Patient keeps her head forward at night with sleeping on 2 pillows and during the day with her posture.  Patient needs tactile cues to retract her shoulders with exercises due to muscle imbalances. Patient responded well to dry needling. When patient retracts her cervcial she will sidebend her neck to the left. Patient wil benefit from skilled therapy to elongate the tissue and improve function.     Rehab Potential  Good    PT Frequency  2x / week    PT Duration  8 weeks    PT Treatment/Interventions  ADLs/Self Care Home Management;Cryotherapy;Electrical Stimulation;Ultrasound;Moist Heat;Therapeutic activities;Therapeutic exercise;Patient/family education;Neuromuscular re-education;Manual techniques;Dry needling    PT Next Visit Plan  assess dry needling, scapular retraction; soft tissue work; Optician, dispensing    PT Home Exercise Plan  Access code:  [email or web address]    Consulted and Agree with Plan of Care  Patient       Patient will benefit from skilled therapeutic intervention in order to improve the following deficits and impairments:  Postural dysfunction, Pain, Increased fascial restricitons, Decreased range of motion, Decreased strength, Impaired perceived functional ability  Visit Diagnosis: Cervicalgia  Muscle weakness (generalized)  Abnormal posture     Problem List Patient Active Problem List   Diagnosis Date Noted  . Dysphagia, pharyngoesophageal phase 01/20/2015  . Costochondritis 06/29/2011  . Temporomandibular joint-pain-dysfunction syndrome (TMJ) 11/16/2010  . COLONIC POLYPS, ADENOMATOUS 09/22/2007  . THYROID DISORDER 09/22/2007  . MITRAL VALVE PROLAPSE 09/22/2007  . Palpitations 09/22/2007  . INTERNAL HEMORRHOIDS 07/02/2003    Earlie Counts, PT 07/24/18 8:46 AM   [facility] [address], [place], [place], [address] Phone: [phone]   Fax:  [phone]  Name: DEREKA LUERAS MRN: [id number] Date of Birth: 08/16/1945

## 2018-08-01 ENCOUNTER — Encounter: Payer: Self-pay | Admitting: Physical Therapy

## 2018-08-01 ENCOUNTER — Ambulatory Visit: Payer: BLUE CROSS/BLUE SHIELD | Attending: Family Medicine | Admitting: Physical Therapy

## 2018-08-01 DIAGNOSIS — R293 Abnormal posture: Secondary | ICD-10-CM | POA: Diagnosis not present

## 2018-08-01 DIAGNOSIS — M6281 Muscle weakness (generalized): Secondary | ICD-10-CM | POA: Diagnosis not present

## 2018-08-01 DIAGNOSIS — M542 Cervicalgia: Secondary | ICD-10-CM | POA: Diagnosis not present

## 2018-08-01 NOTE — Therapy (Signed)
[facility] [address] [place], [place], [address] Phone: [phone]   Fax:  [phone]  Physical Therapy Treatment  Patient Details  Name: Robin Collins MRN: [id number] Date of Birth: May 24, 1946 Referring Provider (PT): Dr. Lorna Few   Encounter Date: 08/01/2018  PT End of Session - 08/01/18 0758    Visit Number  4    Date for PT Re-Evaluation  09/04/18    Authorization Type  Medicare    PT Start Time  0800    PT Stop Time  0840    PT Time Calculation (min)  40 min    Activity Tolerance  Patient tolerated treatment well;No increased pain    Behavior During Therapy  WFL for tasks assessed/performed       Past Medical History:  Diagnosis Date  . Cancer ([place])    basal cell  . Cataract   . COLONIC POLYPS, ADENOMATOUS 09/22/2007   Qualifier: Diagnosis of  By: Ronnald Ramp RN, CGRN, Sheri    . Costochondritis    occ. flares, last 3-4 weeks ago.  . Depression    mild  . Dysphagia, pharyngoesophageal phase 01/20/2015  . Dysrhythmia    "palpitations"- occ.now-not frequent  . Heart murmur   . MITRAL VALVE PROLAPSE 09/22/2007   Qualifier: Diagnosis of  By: Ronnald Ramp RN, [place], [place]    . Multiple thyroid nodules    sees Dr. Conley Rolls every 6 months  . Palpitations   . Temporomandibular joint-pain-dysfunction syndrome (TMJ) 11/16/2010    Past Surgical History:  Procedure Laterality Date  . CATARACT EXTRACTION, BILATERAL    . COLONOSCOPY W/ POLYPECTOMY     last 12'15  . COSMETIC SURGERY     "facial"  . DILATION AND CURETTAGE OF UTERUS    . ESOPHAGOGASTRODUODENOSCOPY (EGD) WITH PROPOFOL N/A 01/20/2015   Procedure: ESOPHAGOGASTRODUODENOSCOPY (EGD) WITH PROPOFOL;  Surgeon: Lafayette Dragon, MD;  [facility];  Service: Endoscopy;  Laterality: N/A;  . EXCISIONAL HEMORRHOIDECTOMY    . EYE SURGERY    . SAVORY DILATION N/A 01/20/2015   Procedure: SAVORY DILATION;  Surgeon: Lafayette Dragon, MD;  [facility];  Service: Endoscopy;  Laterality: N/A;  . TUBAL LIGATION      There were no vitals filed for this visit.  Subjective Assessment - 08/01/18 0759    Subjective  I felt better after the dry needling. I am sleeping on one pillow. I hav enot been taking muscle relaxers and no more than 2 Tylenol per day.     Limitations  House hold activities    Diagnostic tests  x-rays     Patient Stated Goals  alleviate neck pain,  not sleep on multiple pillows    Currently in Pain?  No/denies    Multiple Pain Sites  No                       [facility] Adult PT Treatment/Exercise - 08/01/18 0001      Neck Exercises: Standing   Other Standing Exercises  bil. shoulder extension with tactile and verbal cues to bring scapular together using yellow theraband 2x10    Other Standing Exercises  doorway stretch hold 30 sec 2 times      Manual Therapy   Manual Therapy  Soft tissue mobilization    Soft tissue mobilization  subocciptials, cervical paraspinals, upper trap and bilateral interscapular       Trigger Point Dry Needling - 08/01/18 0841    Consent Given?  Yes    Muscles Treated Upper Body  Suboccipitals muscle group;Upper trapezius;Rhomboids   bil. C3 multifidi   Upper Trapezius Response  Twitch reponse elicited;Palpable increased muscle length   bil.    SubOccipitals Response  Twitch response elicited;Palpable increased muscle length   bil.    Rhomboids Response  Twitch response elicited;Palpable increased muscle length   right          PT Education - 08/01/18 0840    Education Details  Access Code: [email or web address]     Person(s) Educated  Patient    Methods  Explanation;Demonstration;Verbal cues;Handout    Comprehension  Returned demonstration;Verbalized understanding       PT Short Term Goals - 08/01/18 0803      PT SHORT TERM GOAL #1   Title  The patient will demonstrate compliance with initial HEP and postural correction to promote recovery     Time  4    Period   Weeks    Status  Achieved      PT SHORT TERM GOAL #2   Title  The patient will report a 30% improvement in neck pain with usual ADLs including sleep and lifting head off the pillow    Time  4    Period  Weeks    Status  Achieved        PT Long Term Goals - 07/10/18 1545      PT LONG TERM GOAL #1   Title  The patient will be independent with safe self progression of HEP     Time  8    Period  Weeks    Status  New    Target Date  09/04/18      PT LONG TERM GOAL #2   Title  The patient will report a 60% improvement in neck pain at night, mornings and with lifting her head off the pillow.      Time  8    Period  Weeks    Status  New      PT LONG TERM GOAL #3   Title  The patient will have full cervical ROM and symmetrical sidebending and rotation right/left    Time  8    Period  Weeks    Status  New      PT LONG TERM GOAL #4   Title  The patient will have cervical and interscapular muscle strength grossly 4-/5 needed for carrying objects, lifting medium objects overhead and lifting her head off the pillow    Time  8    Period  Weeks    Status  New      PT LONG TERM GOAL #5   Title  FOTO functional outcome score improved from 37% limitation to 32% indicating improved function with less pain    Time  8    Period  Weeks    Status  New            Plan - 08/01/18 0758    Clinical Impression Statement  Patient reports her pain is 30% better. She is now able to sleep on 1 pillow. Patient is not taking muscle relaxers. Patient only takes 2 Tylenol. Patient has tight pectoralis muscles. Patient continues to have weak scapular muscles. Patient had trigger points in subocciptials, upper trap and right rhomboids. Patient will benefit from skilled therapy to elongate the tissue and improve function.     Rehab Potential  Good    PT Frequency  2x / week    PT  Duration  8 weeks    PT Treatment/Interventions  ADLs/Self Care Home Management;Cryotherapy;Electrical  Stimulation;Ultrasound;Moist Heat;Therapeutic activities;Therapeutic exercise;Patient/family education;Neuromuscular re-education;Manual techniques;Dry needling    PT Next Visit Plan   dry needling right subscapularis, , scapular retraction; soft tissue work; stretch pectoralis; measure cervical ROM    PT Home Exercise Plan  Access code:  [email or web address]    Consulted and Agree with Plan of Care  Patient       Patient will benefit from skilled therapeutic intervention in order to improve the following deficits and impairments:  Postural dysfunction, Pain, Increased fascial restricitons, Decreased range of motion, Decreased strength, Impaired perceived functional ability  Visit Diagnosis: Cervicalgia  Muscle weakness (generalized)  Abnormal posture     Problem List Patient Active Problem List   Diagnosis Date Noted  . Dysphagia, pharyngoesophageal phase 01/20/2015  . Costochondritis 06/29/2011  . Temporomandibular joint-pain-dysfunction syndrome (TMJ) 11/16/2010  . COLONIC POLYPS, ADENOMATOUS 09/22/2007  . THYROID DISORDER 09/22/2007  . MITRAL VALVE PROLAPSE 09/22/2007  . Palpitations 09/22/2007  . INTERNAL HEMORRHOIDS 07/02/2003    Earlie Counts, PT 08/01/18 8:47 AM   [facility] [address] [place], [place], [address] Phone: [phone]   Fax:  [phone]  Name: Robin Collins MRN: [id number] Date of Birth: Jan 18, 1946

## 2018-08-01 NOTE — Patient Instructions (Signed)
Access Code: [email or web address]  URL: [email or web address]  Date: 08/01/2018  Prepared by: Earlie Counts   Exercises  Seated Isometric Cervical Rotation - 5 reps - 1 sets - 5 hold - 1x daily - 7x weekly  Seated Isometric Cervical Sidebending - 5 reps - 5 sets - 1x daily - 7x weekly  Seated Isometric Cervical Flexion - 5 reps - 1 sets - 5 hold - 1x daily - 7x weekly  Supine Deep Neck Flexor Nods - 10 reps - 1 sets - 1x daily - 7x weekly  Seated Cervical Retraction - 5 reps - 1 sets - 5sec hold - 3x daily - 7x weekly  Supine Suboccipital Release with Tennis Balls - 1 reps - 1 sets - 30 sec hold - 1x daily - 7x weekly  Supine Mid-Thoracic Mobilization - 1 reps - 1 sets - 30 sec hold - 1x daily - 7x weekly  Supine Upper- Thoracic Spine Mobilization with Taped Tennis Balls I's Y's T's - 10 reps - 1 sets - 1x daily - 7x weekly  Shoulder Extension with Resistance - 10 reps - 2 sets - 1x daily - 7x weekly  Doorway Pec Stretch at 90 Degrees Abduction - 2 reps - 1 sets - 30 sec hold - 1x daily - 7x weekly  [facility] [address], [place], [place] [address] Phone # [phone] Fax [phone]

## 2018-08-05 ENCOUNTER — Ambulatory Visit: Payer: BLUE CROSS/BLUE SHIELD | Admitting: Physical Therapy

## 2018-08-05 ENCOUNTER — Encounter: Payer: Self-pay | Admitting: Physical Therapy

## 2018-08-05 DIAGNOSIS — M6281 Muscle weakness (generalized): Secondary | ICD-10-CM

## 2018-08-05 DIAGNOSIS — M542 Cervicalgia: Secondary | ICD-10-CM

## 2018-08-05 DIAGNOSIS — R293 Abnormal posture: Secondary | ICD-10-CM | POA: Diagnosis not present

## 2018-08-05 NOTE — Therapy (Addendum)
[facility] [address], [place], [place], [address] Phone: [phone]   Fax:  [phone]  Physical Therapy Treatment  Patient Details  Name: Robin Collins MRN: [id number] Date of Birth: 01-04-1946 Referring Provider (PT): Dr. Lorna Few   Encounter Date: 08/05/2018  PT End of Session - 08/05/18 0848    Visit Number  5    Date for PT Re-Evaluation  09/04/18    Authorization Type  Medicare    PT Start Time  0758    PT Stop Time  0840    PT Time Calculation (min)  42 min    Activity Tolerance  Patient tolerated treatment well       Past Medical History:  Diagnosis Date  . Cancer ([place])    basal cell  . Cataract   . COLONIC POLYPS, ADENOMATOUS 09/22/2007   Qualifier: Diagnosis of  By: Ronnald Ramp RN, CGRN, Sheri    . Costochondritis    occ. flares, last 3-4 weeks ago.  . Depression    mild  . Dysphagia, pharyngoesophageal phase 01/20/2015  . Dysrhythmia    "palpitations"- occ.now-not frequent  . Heart murmur   . MITRAL VALVE PROLAPSE 09/22/2007   Qualifier: Diagnosis of  By: Ronnald Ramp RN, [place], [place]    . Multiple thyroid nodules    sees Dr. Conley Rolls every 6 months  . Palpitations   . Temporomandibular joint-pain-dysfunction syndrome (TMJ) 11/16/2010    Past Surgical History:  Procedure Laterality Date  . CATARACT EXTRACTION, BILATERAL    . COLONOSCOPY W/ POLYPECTOMY     last 12'15  . COSMETIC SURGERY     "facial"  . DILATION AND CURETTAGE OF UTERUS    . ESOPHAGOGASTRODUODENOSCOPY (EGD) WITH PROPOFOL N/A 01/20/2015   Procedure: ESOPHAGOGASTRODUODENOSCOPY (EGD) WITH PROPOFOL;  Surgeon: Lafayette Dragon, MD;  [facility];  Service: Endoscopy;  Laterality: N/A;  . EXCISIONAL HEMORRHOIDECTOMY    . EYE SURGERY    . SAVORY DILATION N/A 01/20/2015   Procedure: SAVORY DILATION;  Surgeon: Lafayette Dragon, MD;  [facility];  Service: Endoscopy;  Laterality: N/A;  . TUBAL LIGATION      There were  no vitals filed for this visit.  Subjective Assessment - 08/05/18 0757    Subjective  Feeling pretty good.  I think the DN helps.  No shoulder pain, only neck pain now.  I think the balls behind the hurts my neck.  No pain at all Sat and Sun.     Currently in Pain?  No/denies    Pain Score  0-No pain    Aggravating Factors   Sleeping on 1 pillow         [facility] PT Assessment - 08/05/18 0001      Observation/Other Assessments   Focus on Therapeutic Outcomes (FOTO)   4%      AROM   Cervical Flexion  70    Cervical Extension  60    Cervical - Right Side Bend  51    Cervical - Left Side Bend  45    Cervical - Right Rotation  58    Cervical - Left Rotation  51      Strength   Cervical Flexion  4-/5    Cervical Extension  4-/5    Cervical - Right Side Bend  4-/5    Cervical - Left Side Bend  4-/5    Cervical - Right Rotation  4-/5    Cervical - Left Rotation  4-/5  [place] Adult PT Treatment/Exercise - 08/05/18 0001      Neck Exercises: Seated   Other Seated Exercise  verbal review of previous HEP; eliminate tennis balls with suboccipital       Neck Exercises: Supine   Shoulder Flexion  Both;10 reps   yellow band   Shoulder ABduction  Right;Left;Both;5 reps;10 reps    Shoulder Abduction Limitations  yellow band     Upper Extremity D2  Flexion;Extension;10 reps;Theraband    Theraband Level (UE D2)  Level 1 (Yellow)    Other Supine Exercise  external rotation double and single 10x each     Other Supine Exercise  head lift 2x 10 sec hold      Neck Exercises: Prone   Axial Exension  10 reps    Shoulder Extension  10 reps   with head lift   Other Prone Exercise  Arm lift in M formation with head lift 10x             PT Education - 08/05/18 0833    Education Details  prone shoulder ext with head lift  Access Code: [email or web address] yellow band supine scapular ex    Person(s) Educated  Patient    Methods  Explanation;Demonstration;Handout     Comprehension  Returned demonstration;Verbalized understanding       PT Short Term Goals - 08/01/18 0803      PT SHORT TERM GOAL #1   Title  The patient will demonstrate compliance with initial HEP and postural correction to promote recovery     Time  4    Period  Weeks    Status  Achieved      PT SHORT TERM GOAL #2   Title  The patient will report a 30% improvement in neck pain with usual ADLs including sleep and lifting head off the pillow    Time  4    Period  Weeks    Status  Achieved        PT Long Term Goals - 08/05/18 0900      PT LONG TERM GOAL #1   Title  The patient will be independent with safe self progression of HEP     Time  8    Period  Weeks    Status  On-going      PT LONG TERM GOAL #2   Title  The patient will report a 60% improvement in neck pain at night, mornings and with lifting her head off the pillow.      Time  8    Period  Weeks    Status  On-going      PT LONG TERM GOAL #3   Title  The patient will have full cervical ROM and symmetrical sidebending and rotation right/left    Status  Achieved      PT LONG TERM GOAL #4   Title  The patient will have cervical and interscapular muscle strength grossly 4-/5 needed for carrying objects, lifting medium objects overhead and lifting her head off the pillow    Status  Achieved      PT LONG TERM GOAL #5   Title  FOTO functional outcome score improved from 37% limitation to 32% indicating improved function with less pain    Status  Achieved            Plan - 08/05/18 0800    Clinical Impression Statement  The patient has made significant improvements in cervical ROM in all planes and is now painfree in  all directions.  Her cervical strength is much improved as well to grossly 4-/5 and she is able to lift and hold her head off the pillow for 10 sec.  Her FOTO functional outcome score has improved from 37% limitation to 4%.   Therapist closely monitoring response with all treatment interventions.   She has no pain during any of the exercises today.  Verbal cues for postural alignment needed.  Good progress with rehab goals. Will decrease treatment frequency and follow up in 1 week.   May be ready for discharge in next few visits.      Rehab Potential  Good    PT Frequency  2x / week    PT Duration  8 weeks    PT Treatment/Interventions  ADLs/Self Care Home Management;Cryotherapy;Electrical Stimulation;Ultrasound;Moist Heat;Therapeutic activities;Therapeutic exercise;Patient/family education;Neuromuscular re-education;Manual techniques;Dry needling    PT Next Visit Plan Give red band next visit;   follow up in 1 week to see how she does with HEP;   dry needling as needed , scapular retraction;  soft tissue work; Optician, dispensing    PT Home Exercise Plan  Access code:  [email or web address]       Patient will benefit from skilled therapeutic intervention in order to improve the following deficits and impairments:  Postural dysfunction, Pain, Increased fascial restricitons, Decreased range of motion, Decreased strength, Impaired perceived functional ability  Visit Diagnosis: Cervicalgia  Muscle weakness (generalized)  Abnormal posture     Problem List Patient Active Problem List   Diagnosis Date Noted  . Dysphagia, pharyngoesophageal phase 01/20/2015  . Costochondritis 06/29/2011  . Temporomandibular joint-pain-dysfunction syndrome (TMJ) 11/16/2010  . COLONIC POLYPS, ADENOMATOUS 09/22/2007  . THYROID DISORDER 09/22/2007  . MITRAL VALVE PROLAPSE 09/22/2007  . Palpitations 09/22/2007  . INTERNAL HEMORRHOIDS 07/02/2003   Ruben Im, PT 08/05/18 12:34 PM Phone: [phone] Fax: [phone]  Alvera Singh 08/05/2018, 12:33 PM  [facility] [address], [place], [place], [address] Phone: [phone]   Fax:  [phone]  Name: LAKEISHA WALDROP MRN: [id number] Date of Birth: Dec 22, 1945

## 2018-08-05 NOTE — Patient Instructions (Signed)
Over Head Pull: Narrow Grip       On back, knees bent, feet flat, band across thighs, elbows straight but relaxed. Pull hands apart (start). Keeping elbows straight, bring arms up and over head, hands toward floor. Keep pull steady on band. Hold momentarily. Return slowly, keeping pull steady, back to start. Repeat _10__ times. Band color __yellow___   Side Pull: Double Arm and Single Arm   On back, knees bent, feet flat. Arms perpendicular to body, shoulder level, elbows straight but relaxed. Pull arms out to sides, elbows straight. Resistance band comes across collarbones, hands toward floor. Hold momentarily. Slowly return to starting position. Repeat _10__ times. Band color __yellow___   Sash   On back, knees bent, feet flat, left hand on left hip, right hand above left. Pull right arm DIAGONALLY (hip to shoulder) across chest. Bring right arm along head toward floor. Hold momentarily. Slowly return to starting position. Repeat _10__ times. Do with left arm. Band color __yellow____   Shoulder Rotation: Double Arm   On back, knees bent, feet flat, elbows tucked at sides, bent 90, hands palms up. Pull hands apart and down toward floor, keeping elbows near sides. Hold momentarily. Slowly return to starting position. Repeat __10_ times. Band color __yellow____     [facility] [address] [place], [place] [address] Phone # [phone] Fax [phone]

## 2018-08-07 ENCOUNTER — Encounter: Payer: BLUE CROSS/BLUE SHIELD | Admitting: Physical Therapy

## 2018-08-12 ENCOUNTER — Ambulatory Visit: Payer: BLUE CROSS/BLUE SHIELD | Admitting: Physical Therapy

## 2018-08-12 ENCOUNTER — Encounter: Payer: Self-pay | Admitting: Physical Therapy

## 2018-08-12 DIAGNOSIS — R293 Abnormal posture: Secondary | ICD-10-CM

## 2018-08-12 DIAGNOSIS — M6281 Muscle weakness (generalized): Secondary | ICD-10-CM | POA: Diagnosis not present

## 2018-08-12 DIAGNOSIS — M542 Cervicalgia: Secondary | ICD-10-CM

## 2018-08-12 NOTE — Therapy (Signed)
[facility] [address] [place], [place], [address] Phone: [phone]   Fax:  [phone]  Physical Therapy Treatment  Patient Details  Name: Robin Collins MRN: [id number] Date of Birth: 02-06-1946 Referring Provider (PT): Dr. Lorna Few   Encounter Date: 08/12/2018  PT End of Session - 08/12/18 0815    Visit Number  6    Date for PT Re-Evaluation  09/04/18    Authorization Type  Medicare    PT Start Time  0800    PT Stop Time  0828   pt request    PT Time Calculation (min)  28 min    Activity Tolerance  Patient tolerated treatment well       Past Medical History:  Diagnosis Date  . Cancer ([place])    basal cell  . Cataract   . COLONIC POLYPS, ADENOMATOUS 09/22/2007   Qualifier: Diagnosis of  By: Ronnald Ramp RN, CGRN, Sheri    . Costochondritis    occ. flares, last 3-4 weeks ago.  . Depression    mild  . Dysphagia, pharyngoesophageal phase 01/20/2015  . Dysrhythmia    "palpitations"- occ.now-not frequent  . Heart murmur   . MITRAL VALVE PROLAPSE 09/22/2007   Qualifier: Diagnosis of  By: Ronnald Ramp RN, [place], [place]    . Multiple thyroid nodules    sees Dr. Conley Rolls every 6 months  . Palpitations   . Temporomandibular joint-pain-dysfunction syndrome (TMJ) 11/16/2010    Past Surgical History:  Procedure Laterality Date  . CATARACT EXTRACTION, BILATERAL    . COLONOSCOPY W/ POLYPECTOMY     last 12'15  . COSMETIC SURGERY     "facial"  . DILATION AND CURETTAGE OF UTERUS    . ESOPHAGOGASTRODUODENOSCOPY (EGD) WITH PROPOFOL N/A 01/20/2015   Procedure: ESOPHAGOGASTRODUODENOSCOPY (EGD) WITH PROPOFOL;  Surgeon: Lafayette Dragon, MD;  Location: [facility];  Service: Endoscopy;  Laterality: N/A;  . EXCISIONAL HEMORRHOIDECTOMY    . EYE SURGERY    . SAVORY DILATION N/A 01/20/2015   Procedure: SAVORY DILATION;  Surgeon: Lafayette Dragon, MD;  Location: [facility];  Service: Endoscopy;  Laterality: N/A;  . TUBAL LIGATION       There were no vitals filed for this visit.  Subjective Assessment - 08/12/18 0802    Subjective  I need to review the ex's from last time.  The only pain I have is in the neck in the mornings and it goes away in an hour.  Better since stopped the tennis ball ex.      Patient is accompained by:  Family member    Currently in Pain?  No/denies    Pain Score  0-No pain    Pain Location  Neck         [facility] PT Assessment - 08/12/18 0001      Strength   Cervical Flexion  4-/5    Cervical Extension  4-/5    Cervical - Right Side Bend  4-/5    Cervical - Left Side Bend  4-/5    Cervical - Right Rotation  4-/5    Cervical - Left Rotation  4-/5                   [facility] Adult PT Treatment/Exercise - 08/12/18 0001      Neck Exercises: Standing   Other Standing Exercises  bil. shoulder extension with tactile and verbal cues to bring scapular together using red theraband 2x10    Other Standing Exercises  discussed progression  of supine band ex's to standing wall ex's      Neck Exercises: Supine   Shoulder Flexion  Both;10 reps   red band   Shoulder ABduction  Right;Left;Both;5 reps;10 reps    Shoulder Abduction Limitations  red band     Upper Extremity D2  Flexion;Extension;10 reps;Theraband    Theraband Level (UE D2)  Level 2 (Red)    Other Supine Exercise  external rotation double and single 10x each       Neck Exercises: Prone   Axial Exension  10 reps               PT Short Term Goals - 08/01/18 0803      PT SHORT TERM GOAL #1   Title  The patient will demonstrate compliance with initial HEP and postural correction to promote recovery     Time  4    Period  Weeks    Status  Achieved      PT SHORT TERM GOAL #2   Title  The patient will report a 30% improvement in neck pain with usual ADLs including sleep and lifting head off the pillow    Time  4    Period  Weeks    Status  Achieved        PT Long Term Goals - 08/05/18 0900      PT LONG TERM  GOAL #1   Title  The patient will be independent with safe self progression of HEP     Time  8    Period  Weeks    Status  On-going      PT LONG TERM GOAL #2   Title  The patient will report a 60% improvement in neck pain at night, mornings and with lifting her head off the pillow.      Time  8    Period  Weeks    Status  On-going      PT LONG TERM GOAL #3   Title  The patient will have full cervical ROM and symmetrical sidebending and rotation right/left    Status  Achieved      PT LONG TERM GOAL #4   Title  The patient will have cervical and interscapular muscle strength grossly 4-/5 needed for carrying objects, lifting medium objects overhead and lifting her head off the pillow    Status  Achieved      PT LONG TERM GOAL #5   Title  FOTO functional outcome score improved from 37% limitation to 32% indicating improved function with less pain    Status  Achieved            Plan - 08/12/18 0837    Clinical Impression Statement  The patient reports she is doing very well with minimal neck pain in the morning only and no longer has shoulder pain.  She needs some verbal cues to perform her prone postural strengthening ex's correctly.  She is able to progress to a increased resistance band without production of symptoms.  Much improved postural awareness.  She is progressing well with rehab goals but she is reluctant for full discharge from PT yet.  We agreed that she would work on her HEP for 2 weeks to promote independence and anticipate she will be ready for discharge at that time.      Rehab Potential  Good    PT Frequency  2x / week    PT Duration  8 weeks    PT Treatment/Interventions  ADLs/Self Care Home Management;Cryotherapy;Electrical Stimulation;Ultrasound;Moist Heat;Therapeutic activities;Therapeutic exercise;Patient/family education;Neuromuscular re-education;Manual techniques;Dry needling    PT Next Visit Plan Check % improvement; follow up in 2-3 weeks;  if she is still  doing well will discharge from PT services    PT Home Exercise Plan  Access code:  [email or web address]       Patient will benefit from skilled therapeutic intervention in order to improve the following deficits and impairments:  Postural dysfunction, Pain, Increased fascial restricitons, Decreased range of motion, Decreased strength, Impaired perceived functional ability  Visit Diagnosis: Cervicalgia  Muscle weakness (generalized)  Abnormal posture     Problem List Patient Active Problem List   Diagnosis Date Noted  . Dysphagia, pharyngoesophageal phase 01/20/2015  . Costochondritis 06/29/2011  . Temporomandibular joint-pain-dysfunction syndrome (TMJ) 11/16/2010  . COLONIC POLYPS, ADENOMATOUS 09/22/2007  . THYROID DISORDER 09/22/2007  . MITRAL VALVE PROLAPSE 09/22/2007  . Palpitations 09/22/2007  . INTERNAL HEMORRHOIDS 07/02/2003   Ruben Im, PT 08/12/18 8:54 AM Phone: [phone] Fax: [phone]  Alvera Singh 08/12/2018, 8:53 AM  [facility] [address] [place], [place], [address] Phone: [phone]   Fax:  [phone]  Name: Robin Collins MRN: [id number] Date of Birth: 17-Oct-1945

## 2018-08-14 ENCOUNTER — Ambulatory Visit: Payer: BLUE CROSS/BLUE SHIELD | Admitting: Physical Therapy

## 2018-08-14 DIAGNOSIS — Z681 Body mass index (BMI) 19 or less, adult: Secondary | ICD-10-CM | POA: Diagnosis not present

## 2018-08-14 DIAGNOSIS — Z1231 Encounter for screening mammogram for malignant neoplasm of breast: Secondary | ICD-10-CM | POA: Diagnosis not present

## 2018-08-14 DIAGNOSIS — Z13820 Encounter for screening for osteoporosis: Secondary | ICD-10-CM | POA: Diagnosis not present

## 2018-08-14 DIAGNOSIS — Z01419 Encounter for gynecological examination (general) (routine) without abnormal findings: Secondary | ICD-10-CM | POA: Diagnosis not present

## 2018-08-19 ENCOUNTER — Encounter: Payer: BLUE CROSS/BLUE SHIELD | Admitting: Physical Therapy

## 2018-08-21 ENCOUNTER — Encounter: Payer: BLUE CROSS/BLUE SHIELD | Admitting: Physical Therapy

## 2018-08-21 DIAGNOSIS — L650 Telogen effluvium: Secondary | ICD-10-CM | POA: Diagnosis not present

## 2018-08-26 ENCOUNTER — Encounter: Payer: BLUE CROSS/BLUE SHIELD | Admitting: Physical Therapy

## 2018-08-26 DIAGNOSIS — I679 Cerebrovascular disease, unspecified: Secondary | ICD-10-CM | POA: Diagnosis not present

## 2018-08-26 DIAGNOSIS — L659 Nonscarring hair loss, unspecified: Secondary | ICD-10-CM | POA: Diagnosis not present

## 2018-08-27 DIAGNOSIS — Z0000 Encounter for general adult medical examination without abnormal findings: Secondary | ICD-10-CM | POA: Diagnosis not present

## 2018-08-27 DIAGNOSIS — Z8673 Personal history of transient ischemic attack (TIA), and cerebral infarction without residual deficits: Secondary | ICD-10-CM | POA: Diagnosis not present

## 2018-08-27 DIAGNOSIS — L659 Nonscarring hair loss, unspecified: Secondary | ICD-10-CM | POA: Diagnosis not present

## 2018-08-27 DIAGNOSIS — F419 Anxiety disorder, unspecified: Secondary | ICD-10-CM | POA: Diagnosis not present

## 2018-08-28 ENCOUNTER — Ambulatory Visit: Payer: BLUE CROSS/BLUE SHIELD | Admitting: Physical Therapy

## 2018-08-28 ENCOUNTER — Encounter: Payer: Self-pay | Admitting: Physical Therapy

## 2018-08-28 DIAGNOSIS — R293 Abnormal posture: Secondary | ICD-10-CM | POA: Diagnosis not present

## 2018-08-28 DIAGNOSIS — M6281 Muscle weakness (generalized): Secondary | ICD-10-CM

## 2018-08-28 DIAGNOSIS — M542 Cervicalgia: Secondary | ICD-10-CM

## 2018-08-28 NOTE — Therapy (Signed)
[facility] [address], [place], [place], [address] Phone: [phone]   Fax:  [phone]  Physical Therapy Treatment/Discharge Summary   Patient Details  Name: Robin Collins MRN: [id number] Date of Birth: 02/10/1946 Referring Provider (PT): Dr. Lorna Few   Encounter Date: 08/28/2018  PT End of Session - 08/28/18 1040    Visit Number  7    Date for PT Re-Evaluation  09/04/18    Authorization Type  Medicare    PT Start Time  0800    PT Stop Time  0838    PT Time Calculation (min)  38 min    Activity Tolerance  Patient tolerated treatment well       Past Medical History:  Diagnosis Date  . Cancer ([place])    basal cell  . Cataract   . COLONIC POLYPS, ADENOMATOUS 09/22/2007   Qualifier: Diagnosis of  By: Ronnald Ramp RN, CGRN, Sheri    . Costochondritis    occ. flares, last 3-4 weeks ago.  . Depression    mild  . Dysphagia, pharyngoesophageal phase 01/20/2015  . Dysrhythmia    "palpitations"- occ.now-not frequent  . Heart murmur   . MITRAL VALVE PROLAPSE 09/22/2007   Qualifier: Diagnosis of  By: Ronnald Ramp RN, [place], [place]    . Multiple thyroid nodules    sees Dr. Conley Rolls every 6 months  . Palpitations   . Temporomandibular joint-pain-dysfunction syndrome (TMJ) 11/16/2010    Past Surgical History:  Procedure Laterality Date  . CATARACT EXTRACTION, BILATERAL    . COLONOSCOPY W/ POLYPECTOMY     last 12'15  . COSMETIC SURGERY     "facial"  . DILATION AND CURETTAGE OF UTERUS    . ESOPHAGOGASTRODUODENOSCOPY (EGD) WITH PROPOFOL N/A 01/20/2015   Procedure: ESOPHAGOGASTRODUODENOSCOPY (EGD) WITH PROPOFOL;  Surgeon: Lafayette Dragon, MD;  [facility]: [facility];  Service: Endoscopy;  Laterality: N/A;  . EXCISIONAL HEMORRHOIDECTOMY    . EYE SURGERY    . SAVORY DILATION N/A 01/20/2015   Procedure: SAVORY DILATION;  Surgeon: Lafayette Dragon, MD;  [facility];  Service: Endoscopy;  Laterality: N/A;  . TUBAL LIGATION       There were no vitals filed for this visit.  Subjective Assessment - 08/28/18 0801    Subjective  I'm sore from the shingles shot yesterday so I'm not sure how much I can do today.  I'm OK.  I'm doing my exercises every day.      Currently in Pain?  No/denies    Pain Score  0-No pain    Pain Type  Chronic pain         [facility] PT Assessment - 08/28/18 0001      Observation/Other Assessments   Focus on Therapeutic Outcomes (FOTO)   4%      AROM   Cervical Flexion  70    Cervical Extension  68    Cervical - Right Side Bend  45    Cervical - Left Side Bend  42    Cervical - Right Rotation  60    Cervical - Left Rotation  51      Strength   Right/Left Shoulder  --   interscapular muscles grossly 4+/5   Cervical Flexion  4/5    Cervical Extension  4/5    Cervical - Right Side Bend  4/5    Cervical - Left Side Bend  4/5    Cervical - Right Rotation  4/5    Cervical - Left Rotation  4/5                   [facility] Adult PT Treatment/Exercise - 08/28/18 0001      Self-Care   Self-Care  Other Self-Care Comments    Other Self-Care Comments   discussed her new diagnosis of osteopenia of the femoral necks and appropriate ex's for bone density building; discussed community options including PREP program and [employer] fitness options      Neck Exercises: Machines for Strengthening   Cybex Row  15# vertical row 20x    Lat Pull  20# 20x       Neck Exercises: Seated   Other Seated Exercise  sit to stand 10x no UEs      Neck Exercises: Prone   Axial Exension  10 reps    Shoulder Extension  5 reps   with head lift              PT Short Term Goals - 08/01/18 0803      PT SHORT TERM GOAL #1   Title  The patient will demonstrate compliance with initial HEP and postural correction to promote recovery     Time  4    Period  Weeks    Status  Achieved      PT SHORT TERM GOAL #2   Title  The patient will report a 30% improvement in neck pain with usual ADLs including  sleep and lifting head off the pillow    Time  4    Period  Weeks    Status  Achieved        PT Long Term Goals - 08/28/18 1044      PT LONG TERM GOAL #1   Title  The patient will be independent with safe self progression of HEP     Status  Achieved      PT LONG TERM GOAL #2   Title  The patient will report a 60% improvement in neck pain at night, mornings and with lifting her head off the pillow.      Status  Achieved      PT LONG TERM GOAL #3   Title  The patient will have full cervical ROM and symmetrical sidebending and rotation right/left    Status  Achieved      PT LONG TERM GOAL #4   Title  The patient will have cervical and interscapular muscle strength grossly 4-/5 needed for carrying objects, lifting medium objects overhead and lifting her head off the pillow    Status  Achieved      PT LONG TERM GOAL #5   Title  FOTO functional outcome score improved from 37% limitation to 32% indicating improved function with less pain    Status  Achieved            Plan - 08/28/18 1041    Clinical Impression Statement  The patient reports she is 95% better overall.  She has full and painfree cervical ROM and strength is grossly 4/5.  Interscapular muscle strength is grossly 4+/5.  Her FOTO functional outcome score is significantly improved to only 4% limitation.  She is independent in a safe self progression of HEP.  We discussed community options for supervised exercises for strengthening to also address her new diagnosis of osteopenia.  Referral to PREP program at her request.  Will discharge from PT at this time with all rehab goals met.      PT Home Exercise Plan  Access code:  [email or web address]    Recommended Other Services  referral sent to PREP at patient's request        PHYSICAL THERAPY DISCHARGE SUMMARY  Visits from Start of Care: 7  Current functional level related to goals / functional outcomes: See clinical impressions above   Remaining deficits: As above    Education / Equipment: Comprehensive HEP  Plan: Patient agrees to discharge.  Patient goals were met. Patient is being discharged due to meeting the stated rehab goals.  ?????         Patient will benefit from skilled therapeutic intervention in order to improve the following deficits and impairments:     Visit Diagnosis: Cervicalgia  Muscle weakness (generalized)  Abnormal posture     Problem List Patient Active Problem List   Diagnosis Date Noted  . Dysphagia, pharyngoesophageal phase 01/20/2015  . Costochondritis 06/29/2011  . Temporomandibular joint-pain-dysfunction syndrome (TMJ) 11/16/2010  . COLONIC POLYPS, ADENOMATOUS 09/22/2007  . THYROID DISORDER 09/22/2007  . MITRAL VALVE PROLAPSE 09/22/2007  . Palpitations 09/22/2007  . INTERNAL HEMORRHOIDS 07/02/2003   Ruben Im, PT 08/28/18 10:47 AM Phone: [phone] Fax: [phone]  Alvera Singh 08/28/2018, 10:45 AM  [facility] [address] [place], [place], [address] Phone: [phone]   Fax:  [phone]  Name: Robin Collins MRN: [id number] Date of Birth: 1945/08/06

## 2018-09-01 NOTE — Progress Notes (Signed)
Spoke to Ms. Robin Collins after getting the referral from outpatient PT for the 12-week, twice weekly PREP at the [facility].  She is interested in a day class at [facility] and we'll touch base in late Feb/early March about upcoming classes.

## 2018-09-08 ENCOUNTER — Ambulatory Visit: Payer: BLUE CROSS/BLUE SHIELD | Admitting: Internal Medicine

## 2018-09-08 ENCOUNTER — Encounter: Payer: Self-pay | Admitting: Internal Medicine

## 2018-09-08 VITALS — BP 124/70 | HR 58 | Ht 63.0 in | Wt 105.2 lb

## 2018-09-08 DIAGNOSIS — I480 Paroxysmal atrial fibrillation: Secondary | ICD-10-CM

## 2018-09-08 NOTE — Patient Instructions (Addendum)

## 2018-09-08 NOTE — Progress Notes (Signed)
PCP: [name], MD Primary Cardiologist: Dr Marlou Porch Primary EP: Dr Rayann Heman  Robin Collins is a 73 y.o. female who presents today for routine electrophysiology followup.  Since last being seen in our clinic, the patient reports doing very well.  She is very pleased to have had no recent afib.  She feels that she is tolerating her medicines well. Today, she denies symptoms of palpitations, chest pain, shortness of breath,  lower extremity edema, dizziness, presyncope, or syncope.  The patient is otherwise without complaint today.   Past Medical History:  Diagnosis Date  . Cancer ([place])    basal cell  . Cataract   . COLONIC POLYPS, ADENOMATOUS 09/22/2007   Qualifier: Diagnosis of  By: Ronnald Ramp RN, CGRN, Sheri    . Costochondritis    occ. flares, last 3-4 weeks ago.  . Depression    mild  . Dysphagia, pharyngoesophageal phase 01/20/2015  . Dysrhythmia    "palpitations"- occ.now-not frequent  . Heart murmur   . MITRAL VALVE PROLAPSE 09/22/2007   Qualifier: Diagnosis of  By: Ronnald Ramp RN, [place], [place]    . Multiple thyroid nodules    sees Dr. Conley Rolls every 6 months  . Palpitations   . Temporomandibular joint-pain-dysfunction syndrome (TMJ) 11/16/2010   Past Surgical History:  Procedure Laterality Date  . CATARACT EXTRACTION, BILATERAL    . COLONOSCOPY W/ POLYPECTOMY     last 12'15  . COSMETIC SURGERY     "facial"  . DILATION AND CURETTAGE OF UTERUS    . ESOPHAGOGASTRODUODENOSCOPY (EGD) WITH PROPOFOL N/A 01/20/2015   Procedure: ESOPHAGOGASTRODUODENOSCOPY (EGD) WITH PROPOFOL;  Surgeon: Lafayette Dragon, MD;  [facility];  Service: Endoscopy;  Laterality: N/A;  . EXCISIONAL HEMORRHOIDECTOMY    . EYE SURGERY    . SAVORY DILATION N/A 01/20/2015   Procedure: SAVORY DILATION;  Surgeon: Lafayette Dragon, MD;  [facility];  Service: Endoscopy;  Laterality: N/A;  . TUBAL LIGATION      ROS- all systems are reviewed and negatives except as per HPI above  Current  Outpatient Medications  Medication Sig Dispense Refill  . ALPRAZolam (XANAX) 0.25 MG tablet Take 0.25 mg by mouth daily as needed for anxiety.    Marland Kitchen apixaban (ELIQUIS) 5 MG TABS tablet Take 1 tablet (5 mg total) by mouth 2 (two) times daily. 180 tablet 3  . atenolol (TENORMIN) 50 MG tablet TAKE 1 TABLET BY MOUTH TWICE DAILY 180 tablet 2  . B Complex Vitamins (VITAMIN B COMPLEX PO) Take 1 tablet by mouth once a day    . busPIRone (BUSPAR) 10 MG tablet take 1 tablet by mouth once daily 90 tablet 3  . calcium carbonate 200 MG capsule Take 1,800 mg by mouth daily.     . cholecalciferol (VITAMIN D) 1000 UNITS tablet Take 1,000 Units by mouth daily.      . flecainide (TAMBOCOR) 50 MG tablet Take 0.5 tablets (25 mg total) by mouth 2 (two) times daily. 45 tablet 3  . Multiple Vitamin (MULTIVITAMIN) tablet Take 1 tablet by mouth daily.      Marland Kitchen UNABLE TO FIND Take 4 capsules by mouth daily. nutrafol     No current facility-administered medications for this visit.     Physical Exam: Vitals:   09/08/18 0812  BP: 124/70  Pulse: (!) 58  SpO2: 99%  Weight: 105 lb 3.2 oz (47.7 kg)  Height: 5\' 3"  (1.6 m)    GEN- The patient is well appearing, alert and oriented x 3  today.   Head- normocephalic, atraumatic Eyes-  Sclera clear, conjunctiva pink Ears- hearing intact Oropharynx- clear Lungs- Clear to ausculation bilaterally, normal work of breathing Heart- Regular rate and rhythm, no murmurs, rubs or gallops, PMI not laterally displaced GI- soft, NT, ND, + BS Extremities- no clubbing, cyanosis, or edema  Wt Readings from Last 3 Encounters:  09/08/18 105 lb 3.2 oz (47.7 kg)  05/26/18 106 lb (48.1 kg)  05/21/18 105 lb (47.6 kg)    EKG tracing ordered today is personally reviewed and shows sinus rhythm 56 bpm, PR 172 msec, QRS 78 msec, Qtc 405 msec  Assessment and Plan:  1. Paroxysmal atrial fibrillation Doing well at this time Tolerating flecainide and atenolol chads2vasc score is 4.  On  eliquis  Follow-up in AF clinic in 6 months  Girgenti Grayer MD, [facility] 09/08/2018 8:23 AM

## 2018-10-08 ENCOUNTER — Encounter (HOSPITAL_COMMUNITY): Payer: Self-pay | Admitting: Emergency Medicine

## 2018-10-08 ENCOUNTER — Ambulatory Visit (HOSPITAL_COMMUNITY)
Admission: EM | Admit: 2018-10-08 | Discharge: 2018-10-08 | Disposition: A | Discharge status: Home / Self Care | Payer: BLUE CROSS/BLUE SHIELD | Attending: Family Medicine | Admitting: Family Medicine

## 2018-10-08 ENCOUNTER — Other Ambulatory Visit: Payer: Self-pay

## 2018-10-08 DIAGNOSIS — J069 Acute upper respiratory infection, unspecified: Secondary | ICD-10-CM | POA: Diagnosis not present

## 2018-10-08 DIAGNOSIS — B9789 Other viral agents as the cause of diseases classified elsewhere: Secondary | ICD-10-CM

## 2018-10-08 MED ORDER — BENZONATATE 200 MG PO CAPS: 200.0000 mg | ORAL_CAPSULE | Freq: Three times a day (TID) | ORAL | 0 refills | Status: AC | PRN

## 2018-10-08 MED ORDER — CETIRIZINE HCL 1 MG/ML PO SOLN: 5.0000 mg | Freq: Every day | ORAL | 0 refills | Status: DC

## 2018-10-08 NOTE — Discharge Instructions (Signed)
Your lung sounded clear today Your symptoms are most likely from a viral upper respiratory infection Please begin daily cetirizine/Zyrtec or Claritin-5 mg daily, I sent this in but you may also get over-the-counter, please get liquid in order to take half of the adult dose If needed you may supplement with Mucinex D to further help with congestion Tessalon every 8 hours as needed for cough  Please follow-up in 3 to 4 days if fever persisting, developing worsening cough, shortness of breath, chest discomfort

## 2018-10-08 NOTE — ED Triage Notes (Signed)
miniamal scratchy throat on Sunday.  Yesterday symptoms worsened.  Sore throat, cough, no fever. (99).  Today fever 101.  Has taken tylenol.

## 2018-10-08 NOTE — ED Provider Notes (Signed)
[facility]    CSN: [id number] Arrival date & time: 10/08/18  9379     History   Chief Complaint Chief Complaint  Patient presents with  . URI    HPI Robin Collins is a 73 y.o. female history of paroxysmal A. fib on Eliquis, presenting today for evaluation of sore throat and cough.  Patient states that beginning Sunday, approximately 4 days ago she developed a scratchy throat, since her symptoms have progressed into cough, congestion.  Sore throat mainly feels like drainage versus a true sore throat.  She denies body aches.  Cough is dry during the day, productive at nighttime.  Noted a fever of 101 earlier this morning, took 2 Tylenol Extra Strength at 6 AM.  Denies any nausea vomiting or diarrhea.  Denies history of asthma or smoking.  Patient does note that she recently traveled to [place], did fly.   HPI  Past Medical History:  Diagnosis Date  . Cancer ([place])    basal cell  . Cataract   . COLONIC POLYPS, ADENOMATOUS 09/22/2007   Qualifier: Diagnosis of  By: Ronnald Ramp RN, CGRN, Sheri    . Costochondritis    occ. flares, last 3-4 weeks ago.  . Depression    mild  . Dysphagia, pharyngoesophageal phase 01/20/2015  . Dysrhythmia    "palpitations"- occ.now-not frequent  . Heart murmur   . MITRAL VALVE PROLAPSE 09/22/2007   Qualifier: Diagnosis of  By: Ronnald Ramp RN, [place], [place]    . Multiple thyroid nodules    sees Dr. Conley Rolls every 6 months  . Palpitations   . Temporomandibular joint-pain-dysfunction syndrome (TMJ) 11/16/2010    Patient Active Problem List   Diagnosis Date Noted  . Dysphagia, pharyngoesophageal phase 01/20/2015  . Costochondritis 06/29/2011  . Temporomandibular joint-pain-dysfunction syndrome (TMJ) 11/16/2010  . COLONIC POLYPS, ADENOMATOUS 09/22/2007  . THYROID DISORDER 09/22/2007  . MITRAL VALVE PROLAPSE 09/22/2007  . Palpitations 09/22/2007  . INTERNAL HEMORRHOIDS 07/02/2003    Past Surgical History:  Procedure Laterality Date  .  CATARACT EXTRACTION, BILATERAL    . COLONOSCOPY W/ POLYPECTOMY     last 12'15  . COSMETIC SURGERY     "facial"  . DILATION AND CURETTAGE OF UTERUS    . ESOPHAGOGASTRODUODENOSCOPY (EGD) WITH PROPOFOL N/A 01/20/2015   Procedure: ESOPHAGOGASTRODUODENOSCOPY (EGD) WITH PROPOFOL;  Surgeon: Lafayette Dragon, MD;  Location: [facility];  Service: Endoscopy;  Laterality: N/A;  . EXCISIONAL HEMORRHOIDECTOMY    . EYE SURGERY    . SAVORY DILATION N/A 01/20/2015   Procedure: SAVORY DILATION;  Surgeon: Lafayette Dragon, MD;  Location: [facility];  Service: Endoscopy;  Laterality: N/A;  . TUBAL LIGATION      OB History   No obstetric history on file.      Home Medications    Prior to Admission medications   Medication Sig Start Date End Date Taking? Authorizing Provider  acetaminophen (TYLENOL) 325 MG tablet Take by mouth every 6 (six) hours as needed.   Yes [provider]  apixaban (ELIQUIS) 5 MG TABS tablet Take 1 tablet (5 mg total) by mouth 2 (two) times daily. 02/24/18  Yes Burtis Junes, NP  atenolol (TENORMIN) 50 MG tablet TAKE 1 TABLET BY MOUTH TWICE DAILY 04/21/18  Yes Jerline Pain, MD  B Complex Vitamins (VITAMIN B COMPLEX PO) Take 1 tablet by mouth once a day   Yes [provider]  busPIRone (BUSPAR) 10 MG tablet take 1 tablet by mouth once daily 09/28/14  Yes  Darlin Coco, MD  calcium carbonate 200 MG capsule Take 1,800 mg by mouth daily.    Yes [provider]  cholecalciferol (VITAMIN D) 1000 UNITS tablet Take 1,000 Units by mouth daily.     Yes [provider]  flecainide (TAMBOCOR) 50 MG tablet Take 0.5 tablets (25 mg total) by mouth 2 (two) times daily. 05/21/18  Yes Allred, Jeneen Rinks, MD  Multiple Vitamin (MULTIVITAMIN) tablet Take 1 tablet by mouth daily.     Yes [provider]  UNABLE TO FIND Take 4 capsules by mouth daily. nutrafol   Yes [provider]  ALPRAZolam (XANAX) 0.25 MG tablet Take 0.25 mg by mouth daily as  needed for anxiety.    [provider]  benzonatate (TESSALON) 200 MG capsule Take 1 capsule (200 mg total) by mouth 3 (three) times daily as needed for up to 7 days for cough. 10/08/18 10/15/18  Bathsheba Durrett C, PA-C  cetirizine HCl (ZYRTEC) 1 MG/ML solution Take 5 mLs (5 mg total) by mouth daily for 10 days. 10/08/18 10/18/18  Kaizen Ibsen, Elesa Hacker, PA-C    Family History Family History  Problem Relation Age of Onset  . Kidney disease Father   . Cancer Father   . Diabetes Mother   . Diabetes Sister   . Diabetes Brother   . Cancer Brother   . Diabetes Sister   . Diabetes Brother     Social History Social History   Tobacco Use  . Smoking status: Former Smoker    Types: Cigarettes  . Smokeless tobacco: Never Used  . Tobacco comment: social smoker -no use in 30 yrs  Substance Use Topics  . Alcohol use: Yes    Comment: 2-3 times monthly shots of liquor/mixed drinks  . Drug use: No     Allergies   Flecainide; Augmentin [amoxicillin-pot clavulanate]; Pravastatin sodium; and Vioxx [rofecoxib]   Review of Systems Review of Systems  Constitutional: Positive for fever. Negative for activity change, appetite change, chills and fatigue.  HENT: Positive for congestion, rhinorrhea and sore throat. Negative for ear pain, sinus pressure and trouble swallowing.   Eyes: Negative for discharge and redness.  Respiratory: Positive for cough. Negative for chest tightness and shortness of breath.   Cardiovascular: Negative for chest pain.  Gastrointestinal: Negative for abdominal pain, diarrhea, nausea and vomiting.  Musculoskeletal: Negative for myalgias.  Skin: Negative for rash.  Neurological: Negative for dizziness, light-headedness and headaches.     Physical Exam Triage Vital Signs ED Triage Vitals  Enc Vitals Group     BP 10/08/18 1011 135/77     Pulse Rate 10/08/18 1011 61     Resp 10/08/18 1011 16     Temp 10/08/18 1011 98.9 F (37.2 C)     Temp Source 10/08/18 1011  Oral     SpO2 10/08/18 1011 97 %     Weight --      Height --      Head Circumference --      Peak Flow --      Pain Score 10/08/18 1015 4     Pain Loc --      Pain Edu? --      Excl. in [place]? --    No data found.  Updated Vital Signs BP 135/77 (BP Location: Right Arm)   Pulse 61   Temp 98.9 F (37.2 C) (Oral)   Resp 16   SpO2 97%   Visual Acuity Right Eye Distance:   Left Eye Distance:  Bilateral Distance:    Right Eye Near:   Left Eye Near:    Bilateral Near:     Physical Exam Vitals signs and nursing note reviewed.  Constitutional:      General: She is not in acute distress.    Appearance: She is well-developed.     Comments: No acute distress  HENT:     Head: Normocephalic and atraumatic.     Ears:     Comments: Bilateral ears without tenderness to palpation of external auricle, tragus and mastoid, EAC's without erythema or swelling, TM's with good bony landmarks and cone of light. Non erythematous.     Mouth/Throat:     Comments: Oral mucosa pink and moist, no tonsillar enlargement or exudate. Posterior pharynx patent and nonerythematous, no uvula deviation or swelling. Normal phonation. Eyes:     Conjunctiva/sclera: Conjunctivae normal.  Neck:     Musculoskeletal: Neck supple.  Cardiovascular:     Rate and Rhythm: Normal rate and regular rhythm.     Heart sounds: No murmur.  Pulmonary:     Effort: Pulmonary effort is normal. No respiratory distress.     Breath sounds: Normal breath sounds.     Comments: Breathing comfortably at rest, CTABL, no wheezing, rales or other adventitious sounds auscultated Abdominal:     Palpations: Abdomen is soft.     Tenderness: There is no abdominal tenderness.  Skin:    General: Skin is warm and dry.  Neurological:     Mental Status: She is alert.      UC Treatments / Results  Labs (all labs ordered are listed, but only abnormal results are displayed) Labs Reviewed - No data to display  EKG None  Radiology  No results found.  Procedures Procedures (including critical care time)  Medications Ordered in UC Medications - No data to display  Initial Impression / Assessment and Plan / UC Course  I have reviewed the triage vital signs and the nursing notes.  Pertinent labs & imaging results that were available during my care of the patient were reviewed by me and considered in my medical decision making (see chart for details).     Vital signs stable in clinic, lungs clear, 4 days of URI symptoms, most likely viral.  Will recommend symptomatic and supportive care.  Advised 5 mg of Zyrtec or Claritin daily, may supplement with Mucinex as needed for congestion drainage, Tessalon for cough.  Drug drug interactions with Eliquis and flecainide and were negative.  Appeared safe to take together.  Patient with recent travel, but not to an area high risk for coronavirus, feel this is less likely.  Given patient's age, will recommend close follow-up if symptoms not resolving, fevers persisting or symptoms moving more into her chest.Discussed strict return precautions. Patient verbalized understanding and is agreeable with plan.  Final Clinical Impressions(s) / UC Diagnoses   Final diagnoses:  Viral URI with cough     Discharge Instructions     Your lung sounded clear today Your symptoms are most likely from a viral upper respiratory infection Please begin daily cetirizine/Zyrtec or Claritin-5 mg daily, I sent this in but you may also get over-the-counter, please get liquid in order to take half of the adult dose If needed you may supplement with Mucinex D to further help with congestion Tessalon every 8 hours as needed for cough  Please follow-up in 3 to 4 days if fever persisting, developing worsening cough, shortness of breath, chest discomfort   ED Prescriptions  Medication Sig Dispense Auth. Provider   benzonatate (TESSALON) 200 MG capsule Take 1 capsule (200 mg total) by mouth 3 (three)  times daily as needed for up to 7 days for cough. 28 capsule Arjuna Doeden C, PA-C   cetirizine HCl (ZYRTEC) 1 MG/ML solution Take 5 mLs (5 mg total) by mouth daily for 10 days. 60 mL Aalyssa Elderkin C, PA-C     Controlled Substance Prescriptions [place] Controlled Substance Registry consulted? Not Applicable   Janith Lima, [place] 10/08/18 1146

## 2018-10-15 ENCOUNTER — Telehealth: Payer: Self-pay

## 2018-10-15 NOTE — Telephone Encounter (Signed)
Spoke to Robin Collins regarding [facility] and postponement of [facility] until [facility].

## 2018-11-05 ENCOUNTER — Ambulatory Visit (INDEPENDENT_AMBULATORY_CARE_PROVIDER_SITE_OTHER): Payer: BLUE CROSS/BLUE SHIELD | Admitting: Gastroenterology

## 2018-11-05 ENCOUNTER — Other Ambulatory Visit: Payer: Self-pay

## 2018-11-05 ENCOUNTER — Encounter: Payer: Self-pay | Admitting: Gastroenterology

## 2018-11-05 ENCOUNTER — Other Ambulatory Visit (INDEPENDENT_AMBULATORY_CARE_PROVIDER_SITE_OTHER): Payer: BLUE CROSS/BLUE SHIELD

## 2018-11-05 VITALS — Ht 63.0 in | Wt 98.0 lb

## 2018-11-05 DIAGNOSIS — R197 Diarrhea, unspecified: Secondary | ICD-10-CM

## 2018-11-05 LAB — CBC WITH DIFFERENTIAL/PLATELET
Basophils Absolute: 0 10*3/uL (ref 0.0–0.1)
Basophils Relative: 0.3 % (ref 0.0–3.0)
Eosinophils Absolute: 0.2 10*3/uL (ref 0.0–0.7)
Eosinophils Relative: 2.6 % (ref 0.0–5.0)
HCT: 41.6 % (ref 36.0–46.0)
Hemoglobin: 14.2 g/dL (ref 12.0–15.0)
Lymphocytes Relative: 36.3 % (ref 12.0–46.0)
Lymphs Abs: 3 10*3/uL (ref 0.7–4.0)
MCHC: 34.1 g/dL (ref 30.0–36.0)
MCV: 94.2 fl (ref 78.0–100.0)
Monocytes Absolute: 1 10*3/uL (ref 0.1–1.0)
Monocytes Relative: 11.7 % (ref 3.0–12.0)
Neutro Abs: 4.1 10*3/uL (ref 1.4–7.7)
Neutrophils Relative %: 49.1 % (ref 43.0–77.0)
Platelets: 214 10*3/uL (ref 150.0–400.0)
RBC: 4.42 Mil/uL (ref 3.87–5.11)
RDW: 13.2 % (ref 11.5–15.5)
WBC: 8.2 10*3/uL (ref 4.0–10.5)

## 2018-11-05 LAB — TSH: TSH: 0.72 u[IU]/mL (ref 0.35–4.50)

## 2018-11-05 LAB — BASIC METABOLIC PANEL
BUN: 13 mg/dL (ref 6–23)
CO2: 24 mEq/L (ref 19–32)
Calcium: 9.5 mg/dL (ref 8.4–10.5)
Chloride: 105 mEq/L (ref 96–112)
Creatinine, Ser: 0.98 mg/dL (ref 0.40–1.20)
GFR: 55.32 mL/min — ABNORMAL LOW (ref >60.00)
Glucose, Bld: 77 mg/dL (ref 70–99)
Potassium: 3.6 mEq/L (ref 3.5–5.1)
Sodium: 138 mEq/L (ref 135–145)

## 2018-11-05 LAB — SEDIMENTATION RATE: Sed Rate: 18 mm/hr (ref 0–30)

## 2018-11-05 LAB — C-REACTIVE PROTEIN: CRP: <1 mg/dL (ref 0.5–20.0)

## 2018-11-05 MED ORDER — LOPERAMIDE HCL 2 MG PO CAPS: 4.0000 mg | ORAL_CAPSULE | ORAL | 0 refills | Status: DC | PRN

## 2018-11-05 NOTE — Patient Instructions (Addendum)
I have recommended several stool and blood tests to evaluate your diarrhea.  Drink plenty of fluids.  Avoid high fat foods, as they can make diarrhea worse.   Your provider has requested that you go to the basement level for blood test and stool test. You need to come between 8am-4pm, Monday-Friday. Press "B" on the elevator. The lab is located at the first door on the left as you exit the elevator.  Dairy products (except yogurt) may be difficult to digest when you have diarrhea. I recommend that you temporarily avoid lactose-containing foods.   I recommend a trial of loperamide 4 mg initially, then 2 mg after each unformed stool for ?2 days, with a maximum of 16 mg/day.  If that is not working, you could try bismuth salicylate (Pepto-Bismol) 30 mL or two tablets every 30 minutes for eight doses.  Continue to take your probiotic daily.  Call with any concerns prior to the results being available.  Let's plan to check in with another visit after the stool and blood results are available.  Thank you for your patience with me and our technology today! Please stay home, safe, and healthy. I look forward to meeting you in person in the future.

## 2018-11-05 NOTE — Progress Notes (Signed)
TELEHEALTH VISIT  Referring Provider: [name], MD Primary Care Physician:  [name], MD   Tele-visit due to COVID-19 pandemic Patient requested visit virtually, consented to the virtual encounter via [place] made at: 09:00 11/05/18 Patient verified by name and date of birth Location of patient: Home Location provider: My medical office Names of persons participating: Me, patient, Tinnie Gens CMA Time spent on telehealth visit: 32 minutes  Reason for Consultation:  diarrhea   IMPRESSION:  Diarrhea x 3 weeks Distant history of colitis - etiology unknown History of colon polyp    -Adenoma on colonoscopy 1999    -No further polyps identified and colonoscopy 2004 or 2015 Internal hemorrhoids seen on prior colonoscopy Melanosis coli  Acute diarrhea of unclear etiology.  I am recommending stool studies to evaluate for infection, labs to evaluate for electrolyte abnormalities or thyroid dysfunction, and tests to screen for IBD given her history of colitis.  We will work on supportive management while we await these results.  I have asked her to continue taking her daily probiotic.  We have discussed avoiding dairy and high fat foods.  We discussed management strategies with both loperamide and Pepto-Bismol.   PLAN: - Fecal calprotectin, ESR, and CRP to screen for IBD - Stool for GI pathogen, C Diff - BMP, CBC with diff, TSH - Trial of loperamide 4 mg initially, then 2 mg after each unformed stool for ?2 days, with a maximum of 16 mg/day - Bismuth salicylate (Pepto-Bismol) 30 mL or two tablets every 30 minutes for eight doses  - Continue daily probiotic - Avoid dairy and high fat foods - Call with any concerns prior to the results being available - Office visit after the results are available      HPI: Robin Collins is a 73 y.o. female for diarrhea.  She is self-referred.  Former patient of Dr. Ricky Stabs.  Only records available to me from her prior  evaluation with Dr. Maurene Capes include a colonoscopy for constipation and rectal pain 07/02/2003 that showed internal hemorrhoids but was otherwise normal and a normal colonoscopy 08/28/2013.  Records note a history of an adenoma removed on a colonoscopy in 1999.  She has paroxysmal atrial fibrillation on Eliquis.  The patient called requesting evaluation for a 3 week history of diarrhea.  History is obtained to the patient and review of her electronic health record.  Traveled to [place] in March. Had a URI when she returned treated with 10 days of Zyrtec. Developed frequent BM during that time. She researched Zyrtec and saw that diarrhea could be related however the diarrhea has persisted beyond her Zyrtec use.  Respiratory symptoms have resolved.   Having 10 BM daily. She has lost 5 pounds. No nausea, vomiting, or abdominal pain. No blood or mucous. No systemic complaints. Baseline is constipation with need for Milk of Magnesia QOD.   Started Probiotics 48 hours ago as recommended by Dr. Harrington Challenger ([employer] digestive probiotic).   Denies any other precipitating event, trauma, close contacts with similar symptoms, changes in diet preceding her symptoms, recent travel beyond her trip to [place] or antibiotic use.   Trying to follow the BRAT diet.  Although this has not yet made a big difference.  No prior similar episodes.  She has a distant history of colitis presenting with an urge with bloody mucous and severe abdominal pain. She did required treatment but does not remember the name.  Those records are not available to me at  this time.   I see no prior colonoscopy revealing colitis.  I see no prior cross-sectional abdominal imaging.  No other associated symptoms. No identified exacerbating or relieving features.  No known family history of colon cancer or polyps. No family history of uterine/endometrial cancer, pancreatic cancer or gastric/stomach cancer.  Past Medical History:  Diagnosis  Date  . Cancer ([place])    basal cell  . Cataract   . COLONIC POLYPS, ADENOMATOUS 09/22/2007   Qualifier: Diagnosis of  By: Ronnald Ramp RN, CGRN, Sheri    . Costochondritis    occ. flares, last 3-4 weeks ago.  . Depression    mild  . Dysphagia, pharyngoesophageal phase 01/20/2015  . Dysrhythmia    "palpitations"- occ.now-not frequent  . Heart murmur   . MITRAL VALVE PROLAPSE 09/22/2007   Qualifier: Diagnosis of  By: Ronnald Ramp RN, [place], [place]    . Multiple thyroid nodules    sees Dr. Conley Rolls every 6 months  . Palpitations   . Temporomandibular joint-pain-dysfunction syndrome (TMJ) 11/16/2010    Past Surgical History:  Procedure Laterality Date  . CATARACT EXTRACTION, BILATERAL    . COLONOSCOPY W/ POLYPECTOMY     last 12'15  . COSMETIC SURGERY     "facial"  . DILATION AND CURETTAGE OF UTERUS    . ESOPHAGOGASTRODUODENOSCOPY (EGD) WITH PROPOFOL N/A 01/20/2015   Procedure: ESOPHAGOGASTRODUODENOSCOPY (EGD) WITH PROPOFOL;  Surgeon: Lafayette Dragon, MD;  Location: [facility];  Service: Endoscopy;  Laterality: N/A;  . EXCISIONAL HEMORRHOIDECTOMY    . EYE SURGERY    . SAVORY DILATION N/A 01/20/2015   Procedure: SAVORY DILATION;  Surgeon: Lafayette Dragon, MD;  Location: [facility];  Service: Endoscopy;  Laterality: N/A;  . TUBAL LIGATION      Current Outpatient Medications  Medication Sig Dispense Refill  . acetaminophen (TYLENOL) 325 MG tablet Take by mouth every 6 (six) hours as needed.    . ALPRAZolam (XANAX) 0.25 MG tablet Take 0.25 mg by mouth daily as needed for anxiety.    Marland Kitchen apixaban (ELIQUIS) 5 MG TABS tablet Take 1 tablet (5 mg total) by mouth 2 (two) times daily. 180 tablet 3  . atenolol (TENORMIN) 50 MG tablet TAKE 1 TABLET BY MOUTH TWICE DAILY 180 tablet 2  . B Complex Vitamins (VITAMIN B COMPLEX PO) Take 1 tablet by mouth once a day    . busPIRone (BUSPAR) 10 MG tablet take 1 tablet by mouth once daily 90 tablet 3  . calcium carbonate 200 MG capsule Take 1,800 mg by mouth daily.      . cholecalciferol (VITAMIN D) 1000 UNITS tablet Take 1,000 Units by mouth daily.      . flecainide (TAMBOCOR) 50 MG tablet Take 0.5 tablets (25 mg total) by mouth 2 (two) times daily. 45 tablet 3  . Multiple Vitamin (MULTIVITAMIN) tablet Take 1 tablet by mouth daily.      Marland Kitchen UNABLE TO FIND Take 4 capsules by mouth daily. nutrafol     No current facility-administered medications for this visit.     Allergies as of 11/05/2018 - Review Complete 11/05/2018  Allergen Reaction Noted  . Flecainide Other (See Comments) 05/07/2018  . Augmentin [amoxicillin-pot clavulanate] Nausea And Vomiting 08/14/2013  . Pravastatin sodium  09/08/2018  . Vioxx [rofecoxib]  11/16/2010    Family History  Problem Relation Age of Onset  . Kidney disease Father   . Cancer Father   . Diabetes Mother   . Diabetes Sister   . Diabetes Brother   . Cancer  Brother   . Diabetes Sister   . Diabetes Brother     Social History   Socioeconomic History  . Marital status: Married    Spouse name: Not on file  . Number of children: Not on file  . Years of education: Not on file  . Highest education level: Not on file  Occupational History  . Not on file  Social Needs  . Financial resource strain: Not on file  . Food insecurity:    Worry: Not on file    Inability: Not on file  . Transportation needs:    Medical: Not on file    Non-medical: Not on file  Tobacco Use  . Smoking status: Former Smoker    Types: Cigarettes  . Smokeless tobacco: Never Used  . Tobacco comment: social smoker -no use in 30 yrs  Substance and Sexual Activity  . Alcohol use: Yes    Comment: 2-3 times monthly shots of liquor/mixed drinks  . Drug use: No  . Sexual activity: Not on file  Lifestyle  . Physical activity:    Days per week: Not on file    Minutes per session: Not on file  . Stress: Not on file  Relationships  . Social connections:    Talks on phone: Not on file    Gets together: Not on file    Attends religious  service: Not on file    Active member of club or organization: Not on file    Attends meetings of clubs or organizations: Not on file    Relationship status: Not on file  . Intimate partner violence:    Fear of current or ex partner: Not on file    Emotionally abused: Not on file    Physically abused: Not on file    Forced sexual activity: Not on file  Other Topics Concern  . Not on file  Social History Narrative  . Not on file    Review of Systems: ALL ROS discussed and all others negative except listed in HPI.  Physical Exam: General: in no acute distress Neuro: Alert and appropriate Psych: Normal affect and normal insight Exam limited to [employer]. Tarri Glenn, MD, MPH [facility] 11/05/2018, 8:58 AM

## 2018-11-06 ENCOUNTER — Other Ambulatory Visit: Payer: BLUE CROSS/BLUE SHIELD

## 2018-11-06 DIAGNOSIS — R197 Diarrhea, unspecified: Secondary | ICD-10-CM | POA: Diagnosis not present

## 2018-11-10 LAB — CALPROTECTIN, FECAL: Calprotectin, Fecal: 116 ug/g (ref 0–120)

## 2018-11-11 ENCOUNTER — Telehealth: Payer: Self-pay | Admitting: Gastroenterology

## 2018-11-11 LAB — GASTROINTESTINAL PATHOGEN PANEL PCR
C. difficile Tox A/B, PCR: NOT DETECTED
Campylobacter, PCR: NOT DETECTED
Cryptosporidium, PCR: NOT DETECTED
E coli (ETEC) LT/ST PCR: NOT DETECTED
E coli (STEC) stx1/stx2, PCR: NOT DETECTED
E coli 0157, PCR: NOT DETECTED
Giardia lamblia, PCR: NOT DETECTED
Norovirus, PCR: NOT DETECTED
Rotavirus A, PCR: NOT DETECTED
Salmonella, PCR: NOT DETECTED
Shigella, PCR: NOT DETECTED

## 2018-11-11 LAB — CLOSTRIDIUM DIFFICILE TOXIN B, QUALITATIVE, REAL-TIME PCR: Toxigenic C. Difficile by PCR: NOT DETECTED

## 2018-11-11 NOTE — Telephone Encounter (Signed)
We let her know about the blood test results last week. I am still waiting on one stool test - the GI pathogen panel. The other stools tests are negative. Thanks.

## 2018-11-12 NOTE — Telephone Encounter (Signed)
Sheri please see message from Dr. Tarri Glenn

## 2018-11-12 NOTE — Telephone Encounter (Signed)
Patient notified. Will be called with the rest of the results when available.

## 2018-11-17 NOTE — Addendum Note (Signed)
Addended by: Donell Beers on: 11/17/2018 09:23 PM   Modules accepted: Level of Service

## 2018-11-21 ENCOUNTER — Other Ambulatory Visit: Payer: Self-pay

## 2018-11-21 ENCOUNTER — Telehealth (INDEPENDENT_AMBULATORY_CARE_PROVIDER_SITE_OTHER): Payer: BLUE CROSS/BLUE SHIELD | Admitting: Cardiology

## 2018-11-21 ENCOUNTER — Encounter: Payer: Self-pay | Admitting: Cardiology

## 2018-11-21 VITALS — Ht 63.0 in | Wt 102.0 lb

## 2018-11-21 DIAGNOSIS — I480 Paroxysmal atrial fibrillation: Secondary | ICD-10-CM

## 2018-11-21 DIAGNOSIS — I351 Nonrheumatic aortic (valve) insufficiency: Secondary | ICD-10-CM | POA: Insufficient documentation

## 2018-11-21 DIAGNOSIS — Z7901 Long term (current) use of anticoagulants: Secondary | ICD-10-CM | POA: Insufficient documentation

## 2018-11-21 NOTE — Progress Notes (Signed)
Virtual Visit via Video Note   This visit type was conducted due to national recommendations for restrictions regarding the COVID-19 Pandemic (e.g. social distancing) in an effort to limit this patient's exposure and mitigate transmission in our community.  Due to her co-morbid illnesses, this patient is at least at moderate risk for complications without adequate follow up.  This format is felt to be most appropriate for this patient at this time.  All issues noted in this document were discussed and addressed.  A limited physical exam was performed with this format.  Please refer to the patient's chart for her consent to telehealth for [facility].   Evaluation Performed:  Follow-up visit  Date:  11/21/2018   ID:  Robin Collins, Robin Collins 12-24-1945, MRN [id number]  Patient Location: Home Provider Location: Home  PCP:  [name], MD  Cardiologist:  Candee Furbish, MD  Electrophysiologist:  Thieme Grayer, MD   Chief Complaint: Atrial fibrillation follow-up  History of Present Illness:    Robin Collins is a 73 y.o. female with paroxysmal atrial fibrillation seen by Dr. Rayann Heman last on 09/08/2018 here for follow-up.  Been doing quite well.  No chest pain fevers chills nausea vomiting syncope.  Taking Eliquis.  No bleeding.  Taking atenolol 50 mg twice a day as well as flecanide 25 mg twice a day.  Has follow-up in atrial fibrillation clinic around August.  Chads Vasc is 4.  Stopped pravastatin with hair loss.   Fever and chills in early March. Was in [place], niece passed away. Back on 11/14/22. Sore throat. Tues Aches, runny nose. Wed am fever. Dr. Harrington Challenger sent to urgent care there. PA there sent her back home. Zyrtec. Tess pearls. URI. Not tested for COVID-19. No more fever. Diarrhea for 5 weeks. Testing OK. Dr. Tarri Glenn.   The patient does not have symptoms concerning for COVID-19 infection (fever, chills, cough, or new shortness of breath).    Past Medical History:  Diagnosis  Date  . Cancer ([place])    basal cell  . Cataract   . COLONIC POLYPS, ADENOMATOUS 09/22/2007   Qualifier: Diagnosis of  By: Ronnald Ramp RN, CGRN, Sheri    . Costochondritis    occ. flares, last 3-4 weeks ago.  . Depression    mild  . Dysphagia, pharyngoesophageal phase 01/20/2015  . Dysrhythmia    "palpitations"- occ.now-not frequent  . Heart murmur   . MITRAL VALVE PROLAPSE 09/22/2007   Qualifier: Diagnosis of  By: Ronnald Ramp RN, [place], [place]    . Multiple thyroid nodules    sees Dr. Conley Rolls every 6 months  . Palpitations   . Temporomandibular joint-pain-dysfunction syndrome (TMJ) 11/16/2010   Past Surgical History:  Procedure Laterality Date  . CATARACT EXTRACTION, BILATERAL    . COLONOSCOPY W/ POLYPECTOMY     last 12'15  . COSMETIC SURGERY     "facial"  . DILATION AND CURETTAGE OF UTERUS    . ESOPHAGOGASTRODUODENOSCOPY (EGD) WITH PROPOFOL N/A 01/20/2015   Procedure: ESOPHAGOGASTRODUODENOSCOPY (EGD) WITH PROPOFOL;  Surgeon: Lafayette Dragon, MD;  Location: [facility];  Service: Endoscopy;  Laterality: N/A;  . EXCISIONAL HEMORRHOIDECTOMY    . EYE SURGERY    . SAVORY DILATION N/A 01/20/2015   Procedure: SAVORY DILATION;  Surgeon: Lafayette Dragon, MD;  Location: [facility];  Service: Endoscopy;  Laterality: N/A;  . TUBAL LIGATION       Current Meds  Medication Sig  . acetaminophen (TYLENOL) 500 MG tablet Take 500 mg by mouth every  6 (six) hours as needed.  . ALPRAZolam (XANAX) 0.25 MG tablet Take 0.25 mg by mouth daily as needed for anxiety.  Marland Kitchen apixaban (ELIQUIS) 5 MG TABS tablet Take 1 tablet (5 mg total) by mouth 2 (two) times daily.  Marland Kitchen atenolol (TENORMIN) 50 MG tablet TAKE 1 TABLET BY MOUTH TWICE DAILY  . B Complex Vitamins (VITAMIN B COMPLEX PO) Take 1 tablet by mouth once a day  . busPIRone (BUSPAR) 10 MG tablet Take 10 mg by mouth 2 (two) times daily.  . calcium carbonate (OSCAL) 1500 (600 Ca) MG TABS tablet Take by mouth 2 (two) times daily with a meal.  . cholecalciferol (VITAMIN  D) 1000 UNITS tablet Take 1,000 Units by mouth daily.    . flecainide (TAMBOCOR) 50 MG tablet Take 0.5 tablets (25 mg total) by mouth 2 (two) times daily.  Marland Kitchen loperamide (IMODIUM) 2 MG capsule Take 2 capsules (4 mg total) by mouth as needed for diarrhea or loose stools.  . Multiple Vitamin (MULTIVITAMIN) tablet Take 1 tablet by mouth daily.       Allergies:   Flecainide; Augmentin [amoxicillin-pot clavulanate]; Pravastatin sodium; and Vioxx [rofecoxib]   Social History   Tobacco Use  . Smoking status: Former Smoker    Types: Cigarettes  . Smokeless tobacco: Never Used  . Tobacco comment: social smoker -no use in 30 yrs  Substance Use Topics  . Alcohol use: Yes    Comment: 2-3 times monthly shots of liquor/mixed drinks  . Drug use: No     Family Hx: The patient's family history includes Cancer in her brother and father; Diabetes in her brother, brother, mother, sister, and sister; Kidney disease in her father.  ROS:   Please see the history of present illness.    Denies any chest pain, current fevers chills. All other systems reviewed and are negative.   Prior CV studies:   The following studies were reviewed today:  ETT and echocardiogram reviewed normal  Labs/Other Tests and Data Reviewed:    EKG:  An ECG dated 09/08/18 was personally reviewed today and demonstrated:  Sinus rhythm 56 QTC 405  Recent Labs: 02/22/2018: Magnesium 2.2 02/24/2018: ALT 20 11/05/2018: BUN 13; Creatinine, Ser 0.98; Hemoglobin 14.2; Platelets 214.0; Potassium 3.6; Sodium 138; TSH 0.72   Recent Lipid Panel Lab Results  Component Value Date/Time   CHOL 195 02/24/2018 08:59 AM   TRIG 150 (H) 02/24/2018 08:59 AM   HDL 81 02/24/2018 08:59 AM   CHOLHDL 2.4 02/24/2018 08:59 AM   LDLCALC 84 02/24/2018 08:59 AM    Wt Readings from Last 3 Encounters:  11/21/18 102 lb (46.3 kg)  11/05/18 98 lb (44.5 kg)  09/08/18 105 lb 3.2 oz (47.7 kg)     Objective:    Vital Signs:  Ht 5\' 3"  (1.6 m)   Wt 102  lb (46.3 kg)   BMI 18.07 kg/m    VITAL SIGNS:  reviewed GEN:  no acute distress EYES:  sclerae anicteric, EOMI - Extraocular Movements Intact RESPIRATORY:  normal respiratory effort, symmetric expansion CARDIOVASCULAR:  no peripheral edema SKIN:  no rash, lesions or ulcers. MUSCULOSKELETAL:  no obvious deformities. NEURO:  alert and oriented x 3, no obvious focal deficit PSYCH:  normal affect  ASSESSMENT & PLAN:    Paroxysmal atrial fibrillation - Originally diagnosed July 19 with 10 hours of atrial fibrillation but she had noted palpitations for many years.  Very low-dose flecainide.  Feeling much better.  Prior exercise treadmill test normal post flecainide use.  Prior echocardiogram with normal EF with aortic valve with mild to moderate regurgitation. Has not had episode since last  July.  Discussed what to do if she does have atrial fibrillation, take extra flecainide.  Can call atrial fibrillation clinic if necessary.  Chronic anticoagulation - Score is 4.  Eliquis.  No bleeding.  History of TIA - Anticoagulation.  Mild aortic insufficiency - Continue to monitor.  Should be of no clinical consequence at this point.  COVID-19 Education: The signs and symptoms of COVID-19 were discussed with the patient and how to seek care for testing (follow up with PCP or arrange E-visit).  The importance of social distancing was discussed today.  Time:   Today, I have spent 15 minutes with the patient with telehealth technology discussing the above problems.     Medication Adjustments/Labs and Tests Ordered: Current medicines are reviewed at length with the patient today.  Concerns regarding medicines are outlined above.   Tests Ordered: No orders of the defined types were placed in this encounter.   Medication Changes: No orders of the defined types were placed in this encounter.   Disposition:  Follow up in 1 year(s)  Signed, Candee Furbish, MD  11/21/2018 10:53 AM    [facility]

## 2018-11-21 NOTE — Patient Instructions (Signed)
  Medication Instructions:  The current medical regimen is effective;  continue present plan and medications.  If you need a refill on your cardiac medications before your next appointment, please call your pharmacy.   Follow-Up: Follow up in 1 year with Dr. Skains.  You will receive a letter in the mail 2 months before you are due.  Please call us when you receive this letter to schedule your follow up appointment.  Thank you for choosing [facility]!!     

## 2018-12-01 ENCOUNTER — Ambulatory Visit: Payer: BLUE CROSS/BLUE SHIELD | Admitting: Cardiology

## 2018-12-20 ENCOUNTER — Other Ambulatory Visit: Payer: Self-pay | Admitting: Cardiology

## 2019-01-18 ENCOUNTER — Other Ambulatory Visit: Payer: Self-pay | Admitting: Nurse Practitioner

## 2019-01-19 NOTE — Telephone Encounter (Signed)
Pt last saw Dr Marlou Porch 11/21/18 telemedicine Covid-19, last labs 11/05/18 Creat 0.98, age 73, weight 44.5kg, based on specified criteria pt is on appropriate dosage of Eliquis 5mg  BID. Will refill rx.

## 2019-02-10 DIAGNOSIS — Z711 Person with feared health complaint in whom no diagnosis is made: Secondary | ICD-10-CM | POA: Diagnosis not present

## 2019-02-10 DIAGNOSIS — Z03818 Encounter for observation for suspected exposure to other biological agents ruled out: Secondary | ICD-10-CM | POA: Diagnosis not present

## 2019-02-26 DIAGNOSIS — E7800 Pure hypercholesterolemia, unspecified: Secondary | ICD-10-CM | POA: Diagnosis not present

## 2019-02-26 DIAGNOSIS — F419 Anxiety disorder, unspecified: Secondary | ICD-10-CM | POA: Diagnosis not present

## 2019-03-05 ENCOUNTER — Telehealth: Payer: Self-pay

## 2019-03-05 NOTE — Telephone Encounter (Signed)
Left message regarding appt on 03/09/19.

## 2019-03-05 NOTE — Telephone Encounter (Signed)
Spoke with pt regarding appt on 03/09/19. Pt was advise to check vitals prior to appt. Pt questions and concerns were address. 

## 2019-03-06 DIAGNOSIS — Z8673 Personal history of transient ischemic attack (TIA), and cerebral infarction without residual deficits: Secondary | ICD-10-CM | POA: Diagnosis not present

## 2019-03-06 DIAGNOSIS — E7800 Pure hypercholesterolemia, unspecified: Secondary | ICD-10-CM | POA: Diagnosis not present

## 2019-03-09 ENCOUNTER — Telehealth (INDEPENDENT_AMBULATORY_CARE_PROVIDER_SITE_OTHER): Payer: BC Managed Care – PPO | Admitting: Internal Medicine

## 2019-03-09 ENCOUNTER — Encounter: Payer: Self-pay | Admitting: Internal Medicine

## 2019-03-09 VITALS — HR 60 | Ht 63.0 in | Wt 104.0 lb

## 2019-03-09 DIAGNOSIS — R002 Palpitations: Secondary | ICD-10-CM

## 2019-03-09 DIAGNOSIS — I351 Nonrheumatic aortic (valve) insufficiency: Secondary | ICD-10-CM

## 2019-03-09 DIAGNOSIS — I480 Paroxysmal atrial fibrillation: Secondary | ICD-10-CM | POA: Diagnosis not present

## 2019-03-09 NOTE — Progress Notes (Signed)
Electrophysiology TeleHealth Note   Due to national recommendations of social distancing due to COVID 19, an audio/video telehealth visit is felt to be most appropriate for this patient at this time.  See MyChart message from today for the patient's consent to telehealth for [facility].   Date:  03/09/2019   ID:  AANIKA Collins, DOB 13-Mar-1946, MRN [id number]  Location: patient's home  Provider location:  [facility]  Evaluation Performed: Follow-up visit  PCP:  [name], MD   Electrophysiologist:  Dr Rayann Heman  Chief Complaint:  palpitations  History of Present Illness:    Robin Collins is a 73 y.o. female who presents via telehealth conferencing today.  Since last being seen in our clinic, the patient reports doing very well.  No afib since starting flecainide a year ago.  She has short palpitations at night but no sustained afib.  She attributes this to anxiety.  She is very fearful of COVID 19. Today, she denies symptoms of  chest pain, shortness of breath,  lower extremity edema, dizziness, presyncope, or syncope.  The patient is otherwise without complaint today.  The patient denies symptoms of fevers, chills, cough, or new SOB worrisome for COVID 19.  Past Medical History:  Diagnosis Date  . Cancer ([place])    basal cell  . Cataract   . COLONIC POLYPS, ADENOMATOUS 09/22/2007   Qualifier: Diagnosis of  By: Ronnald Ramp RN, CGRN, Sheri    . Costochondritis    occ. flares, last 3-4 weeks ago.  . Depression    mild  . Dysphagia, pharyngoesophageal phase 01/20/2015  . Dysrhythmia    "palpitations"- occ.now-not frequent  . Heart murmur   . MITRAL VALVE PROLAPSE 09/22/2007   Qualifier: Diagnosis of  By: Ronnald Ramp RN, [place], [place]    . Multiple thyroid nodules    sees Dr. Conley Rolls every 6 months  . Palpitations   . Temporomandibular joint-pain-dysfunction syndrome (TMJ) 11/16/2010    Past Surgical History:  Procedure Laterality Date  . CATARACT EXTRACTION, BILATERAL     . COLONOSCOPY W/ POLYPECTOMY     last 12'15  . COSMETIC SURGERY     "facial"  . DILATION AND CURETTAGE OF UTERUS    . ESOPHAGOGASTRODUODENOSCOPY (EGD) WITH PROPOFOL N/A 01/20/2015   Procedure: ESOPHAGOGASTRODUODENOSCOPY (EGD) WITH PROPOFOL;  Surgeon: Lafayette Dragon, MD;  [facility];  Service: Endoscopy;  Laterality: N/A;  . EXCISIONAL HEMORRHOIDECTOMY    . EYE SURGERY    . SAVORY DILATION N/A 01/20/2015   Procedure: SAVORY DILATION;  Surgeon: Lafayette Dragon, MD;  [facility];  Service: Endoscopy;  Laterality: N/A;  . TUBAL LIGATION      Current Outpatient Medications  Medication Sig Dispense Refill  . acetaminophen (TYLENOL) 500 MG tablet Take 500 mg by mouth every 6 (six) hours as needed.    . ALPRAZolam (XANAX) 0.25 MG tablet Take 0.25 mg by mouth daily as needed for anxiety.    Marland Kitchen atenolol (TENORMIN) 50 MG tablet TAKE 1 TABLET BY MOUTH TWICE DAILY 180 tablet 2  . B Complex Vitamins (VITAMIN B COMPLEX PO) Take 1 tablet by mouth once a day    . busPIRone (BUSPAR) 10 MG tablet Take 10 mg by mouth 2 (two) times daily.    Marland Kitchen ELIQUIS 5 MG TABS tablet TAKE 1 TABLET BY MOUTH TWICE DAILY 180 tablet 1  . flecainide (TAMBOCOR) 50 MG tablet Take 25 mg by mouth 2 (two) times daily.    Marland Kitchen loperamide (IMODIUM)  2 MG capsule Take 2 capsules (4 mg total) by mouth as needed for diarrhea or loose stools. 30 capsule 0  . Multiple Vitamin (MULTIVITAMIN) tablet Take 1 tablet by mouth daily.       No current facility-administered medications for this visit.     Allergies:   Flecainide, Augmentin [amoxicillin-pot clavulanate], Pravastatin sodium, and Vioxx [rofecoxib]   Social History:  The patient  reports that she has quit smoking. Her smoking use included cigarettes. She has never used smokeless tobacco. She reports current alcohol use. She reports that she does not use drugs.   Family History:  The patient's family history includes Cancer in her brother and father; Diabetes in her  brother, brother, mother, sister, and sister; Kidney disease in her father.   ROS:  Please see the history of present illness.   All other systems are personally reviewed and negative.    Exam:    Vital Signs:  Pulse 60   Ht 5\' 3"  (1.6 m)   Wt 104 lb (47.2 kg)   BMI 18.42 kg/m   Well sounding and appearing, alert and conversant, regular work of breathing,  good skin color Eyes- anicteric, neuro- grossly intact, skin- no apparent rash or lesions or cyanosis, mouth- oral mucosa is pink  Labs/Other Tests and Data Reviewed:    Recent Labs: 11/05/2018: BUN 13; Creatinine, Ser 0.98; Hemoglobin 14.2; Platelets 214.0; Potassium 3.6; Sodium 138; TSH 0.72   Wt Readings from Last 3 Encounters:  03/09/19 104 lb (47.2 kg)  11/21/18 102 lb (46.3 kg)  11/05/18 98 lb (44.5 kg)      ASSESSMENT & PLAN:    1.  Paroxysmal atrial fibrillation Maintaining sinus with flecainide No changes On eliquis for chads2vasc score of 4  2. Moderate AI Consider repeat echo on return  Follow-up:  Return to see me in 6 months   Patient Risk:  after full review of this patients clinical status, I feel that they are at moderate risk at this time.  Today, I have spent 15 minutes with the patient with telehealth technology discussing arrhythmia management .    Army Fossa, MD  03/09/2019 9:48 AM     [facility] [address] [place] [address] [phone] (office) [phone] (fax)

## 2019-03-26 DIAGNOSIS — H9311 Tinnitus, right ear: Secondary | ICD-10-CM | POA: Diagnosis not present

## 2019-03-27 DIAGNOSIS — Z23 Encounter for immunization: Secondary | ICD-10-CM | POA: Diagnosis not present

## 2019-03-27 DIAGNOSIS — H6121 Impacted cerumen, right ear: Secondary | ICD-10-CM | POA: Diagnosis not present

## 2019-03-27 DIAGNOSIS — H6090 Unspecified otitis externa, unspecified ear: Secondary | ICD-10-CM | POA: Diagnosis not present

## 2019-04-12 ENCOUNTER — Other Ambulatory Visit: Payer: Self-pay | Admitting: Internal Medicine

## 2019-04-13 ENCOUNTER — Encounter: Payer: Self-pay | Admitting: Internal Medicine

## 2019-04-13 DIAGNOSIS — H5213 Myopia, bilateral: Secondary | ICD-10-CM | POA: Diagnosis not present

## 2019-04-13 DIAGNOSIS — H04123 Dry eye syndrome of bilateral lacrimal glands: Secondary | ICD-10-CM | POA: Diagnosis not present

## 2019-04-13 DIAGNOSIS — Z961 Presence of intraocular lens: Secondary | ICD-10-CM | POA: Diagnosis not present

## 2019-04-13 DIAGNOSIS — H35363 Drusen (degenerative) of macula, bilateral: Secondary | ICD-10-CM | POA: Diagnosis not present

## 2019-04-13 DIAGNOSIS — H3562 Retinal hemorrhage, left eye: Secondary | ICD-10-CM | POA: Diagnosis not present

## 2019-06-05 DIAGNOSIS — Z20828 Contact with and (suspected) exposure to other viral communicable diseases: Secondary | ICD-10-CM | POA: Diagnosis not present

## 2019-06-11 DIAGNOSIS — Z862 Personal history of diseases of the blood and blood-forming organs and certain disorders involving the immune mechanism: Secondary | ICD-10-CM | POA: Diagnosis not present

## 2019-06-11 DIAGNOSIS — E042 Nontoxic multinodular goiter: Secondary | ICD-10-CM | POA: Diagnosis not present

## 2019-06-11 DIAGNOSIS — E7800 Pure hypercholesterolemia, unspecified: Secondary | ICD-10-CM | POA: Diagnosis not present

## 2019-06-15 DIAGNOSIS — H3562 Retinal hemorrhage, left eye: Secondary | ICD-10-CM | POA: Diagnosis not present

## 2019-06-18 ENCOUNTER — Other Ambulatory Visit: Payer: Self-pay | Admitting: Cardiology

## 2019-06-18 ENCOUNTER — Other Ambulatory Visit: Payer: Self-pay | Admitting: *Deleted

## 2019-06-18 DIAGNOSIS — E042 Nontoxic multinodular goiter: Secondary | ICD-10-CM | POA: Diagnosis not present

## 2019-06-18 DIAGNOSIS — M8580 Other specified disorders of bone density and structure, unspecified site: Secondary | ICD-10-CM | POA: Diagnosis not present

## 2019-06-18 DIAGNOSIS — E7800 Pure hypercholesterolemia, unspecified: Secondary | ICD-10-CM | POA: Diagnosis not present

## 2019-06-18 DIAGNOSIS — Z862 Personal history of diseases of the blood and blood-forming organs and certain disorders involving the immune mechanism: Secondary | ICD-10-CM | POA: Diagnosis not present

## 2019-06-18 MED ORDER — APIXABAN 5 MG PO TABS: 5.0000 mg | ORAL_TABLET | Freq: Two times a day (BID) | ORAL | 1 refills | Status: DC

## 2019-06-18 NOTE — Telephone Encounter (Signed)
Age 73, weight 47.2kg, SCr 0.98 on 11/05/18, last OV August 2020, afib indication. Appropriate for refill, sent in.

## 2019-06-18 NOTE — Telephone Encounter (Signed)
New Message   *STAT* If patient is at the pharmacy, call can be transferred to refill team.   1. Which medications need to be refilled? (please list name of each medication and dose if known) ELIQUIS 5 MG TABS tablet  2. Which pharmacy/location (including street and city if local pharmacy) is medication to be sent to? [id number] - [place], [place] AT [place]  3. Do they need a 30 day or 90 day supply? 90 day   Patient is out of this medication, needs today.

## 2019-06-24 DIAGNOSIS — J00 Acute nasopharyngitis [common cold]: Secondary | ICD-10-CM | POA: Diagnosis not present

## 2019-06-24 DIAGNOSIS — Z1159 Encounter for screening for other viral diseases: Secondary | ICD-10-CM | POA: Diagnosis not present

## 2019-07-03 DIAGNOSIS — L814 Other melanin hyperpigmentation: Secondary | ICD-10-CM | POA: Diagnosis not present

## 2019-07-03 DIAGNOSIS — D1801 Hemangioma of skin and subcutaneous tissue: Secondary | ICD-10-CM | POA: Diagnosis not present

## 2019-07-03 DIAGNOSIS — Z85828 Personal history of other malignant neoplasm of skin: Secondary | ICD-10-CM | POA: Diagnosis not present

## 2019-07-03 DIAGNOSIS — D225 Melanocytic nevi of trunk: Secondary | ICD-10-CM | POA: Diagnosis not present

## 2019-07-03 DIAGNOSIS — L570 Actinic keratosis: Secondary | ICD-10-CM | POA: Diagnosis not present

## 2019-08-10 ENCOUNTER — Ambulatory Visit: Payer: BC Managed Care – PPO | Attending: Internal Medicine

## 2019-08-10 DIAGNOSIS — Z23 Encounter for immunization: Secondary | ICD-10-CM

## 2019-08-10 NOTE — Progress Notes (Signed)
   Covid-19 Vaccination Clinic  Name:  Robin Collins    MRN: [id number] DOB: 06/01/1946  08/10/2019  [name] was observed post Covid-19 immunization for 30 minutes based on pre-vaccination screening without incidence. She was provided with Vaccine Information Sheet and instruction to access the V-Safe system.   [name] was instructed to call 911 with any severe reactions post vaccine: Marland Kitchen Difficulty breathing  . Swelling of your face and throat  . A fast heartbeat  . A bad rash all over your body  . Dizziness and weakness    Immunizations Administered    Name Date Dose VIS Date Route   Pfizer COVID-19 Vaccine 08/10/2019 12:00 PM 0.3 mL 07/10/2019 Intramuscular   Manufacturer: [place]   Lot: [id number]   [place]: [id number]

## 2019-08-18 DIAGNOSIS — Z1231 Encounter for screening mammogram for malignant neoplasm of breast: Secondary | ICD-10-CM | POA: Diagnosis not present

## 2019-08-18 DIAGNOSIS — I10 Essential (primary) hypertension: Secondary | ICD-10-CM | POA: Diagnosis not present

## 2019-08-18 DIAGNOSIS — Z01419 Encounter for gynecological examination (general) (routine) without abnormal findings: Secondary | ICD-10-CM | POA: Diagnosis not present

## 2019-08-18 DIAGNOSIS — Z681 Body mass index (BMI) 19 or less, adult: Secondary | ICD-10-CM | POA: Diagnosis not present

## 2019-08-20 ENCOUNTER — Other Ambulatory Visit: Payer: Self-pay | Admitting: Cardiology

## 2019-08-20 DIAGNOSIS — R944 Abnormal results of kidney function studies: Secondary | ICD-10-CM | POA: Diagnosis not present

## 2019-08-20 DIAGNOSIS — R636 Underweight: Secondary | ICD-10-CM | POA: Diagnosis not present

## 2019-08-20 DIAGNOSIS — I10 Essential (primary) hypertension: Secondary | ICD-10-CM | POA: Diagnosis not present

## 2019-08-20 NOTE — Telephone Encounter (Signed)
Prescription refill request for Eliquis received.  Last office visit: 03/09/2019, [place] Scr: 0.98, 11/05/2018 Age: 74 y.o. Weight: 44.5 kg   Prescription refill sent.

## 2019-08-28 ENCOUNTER — Ambulatory Visit: Payer: Self-pay

## 2019-08-29 ENCOUNTER — Ambulatory Visit: Payer: BC Managed Care – PPO | Attending: Internal Medicine

## 2019-08-29 DIAGNOSIS — Z23 Encounter for immunization: Secondary | ICD-10-CM | POA: Insufficient documentation

## 2019-08-29 NOTE — Progress Notes (Signed)
   Covid-19 Vaccination Clinic  Name:  Robin Collins    MRN: [id number] DOB: Apr 15, 1946  08/29/2019  [name] was observed post Covid-19 immunization for 15 minutes without incidence. She was provided with Vaccine Information Sheet and instruction to access the V-Safe system.   [name] was instructed to call 911 with any severe reactions post vaccine: Marland Kitchen Difficulty breathing  . Swelling of your face and throat  . A fast heartbeat  . A bad rash all over your body  . Dizziness and weakness    Immunizations Administered    Name Date Dose VIS Date Route   Pfizer COVID-19 Vaccine 08/29/2019  8:17 AM 0.3 mL 07/10/2019 Intramuscular   Manufacturer: [place]   Lot: [id number]   [place]: [id number]

## 2019-08-31 ENCOUNTER — Other Ambulatory Visit: Payer: Self-pay | Admitting: Cardiology

## 2019-08-31 MED ORDER — ATENOLOL 50 MG PO TABS: 50.0000 mg | ORAL_TABLET | Freq: Two times a day (BID) | ORAL | 0 refills | Status: DC

## 2019-09-04 ENCOUNTER — Encounter: Payer: Self-pay | Admitting: Cardiology

## 2019-09-04 ENCOUNTER — Ambulatory Visit: Payer: BC Managed Care – PPO | Admitting: Cardiology

## 2019-09-04 ENCOUNTER — Other Ambulatory Visit: Payer: Self-pay

## 2019-09-04 VITALS — BP 134/70 | HR 62 | Ht 63.0 in | Wt 104.8 lb

## 2019-09-04 DIAGNOSIS — I480 Paroxysmal atrial fibrillation: Secondary | ICD-10-CM | POA: Diagnosis not present

## 2019-09-04 DIAGNOSIS — I10 Essential (primary) hypertension: Secondary | ICD-10-CM

## 2019-09-04 DIAGNOSIS — R002 Palpitations: Secondary | ICD-10-CM | POA: Diagnosis not present

## 2019-09-04 DIAGNOSIS — I351 Nonrheumatic aortic (valve) insufficiency: Secondary | ICD-10-CM | POA: Diagnosis not present

## 2019-09-04 NOTE — Patient Instructions (Signed)
Medication Instructions:  NO CHANGES *If you need a refill on your cardiac medications before your next appointment, please call your pharmacy*  Lab Work: NONE If you have labs (blood work) drawn today and your tests are completely normal, you will receive your results only by: Marland Kitchen MyChart Message (if you have MyChart) OR . A paper copy in the mail If you have any lab test that is abnormal or we need to change your treatment, we will call you to review the results.  Testing/Procedures: Your physician has requested that you have an echocardiogram. Echocardiography is a painless test that uses sound waves to create images of your heart. It provides your doctor with information about the size and shape of your heart and how well your heart's chambers and valves are working. This procedure takes approximately one hour. There are no restrictions for this procedure.    Your physician has requested that you have a renal artery duplex. During this test, an ultrasound is used to evaluate blood flow to the kidneys. Allow one hour for this exam. Do not eat after midnight the day before and avoid carbonated beverages. Take your medications as you usually do.   Follow-Up: At [facility], you and your health needs are our priority.  As part of our continuing mission to provide you with exceptional heart care, we have created designated Provider Care Teams.  These Care Teams include your primary Cardiologist (physician) and Advanced Practice Providers (APPs -  Physician Assistants and Nurse Practitioners) who all work together to provide you with the care you need, when you need it.  Your next appointment:   YEAR  The format for your next appointment:   In Person  Provider:   You may see Candee Furbish, MD or one of the following Advanced Practice Providers on your designated Care Team:    Truitt Merle, NP  Cecilie Kicks, NP  Kathyrn Drown, NP   Other Instructions

## 2019-09-04 NOTE — Progress Notes (Signed)
Cardiology Office Note:    Date:  09/04/2019   ID:  Robin Collins, DOB 01/24/1946, MRN [id number]  PCP:  [name], MD  Cardiologist:  Candee Furbish, MD  Electrophysiologist:  Elbaum Grayer, MD   Referring MD: [name], MD     History of Present Illness:    Robin Collins is a 74 y.o. female with paroxysmal atrial fibrillation, Dr. Rayann Heman February 2020, here for evaluation of sudden hypertension at the request of Dr. Harrington Challenger to assess for possible vascular source.  08/18/19 - OB GYN 192 SBP. Called Dr. Harrington Challenger, saw Dr. Dwyane Dee in walk in clinic. 188/88. Gave her amlodipine 5mg . 2 days later saw Dr. Harrington Challenger. Referred to me. Mentioned kidney function. Taking amlodipine. Pule 66 or below.   Sick for 3 day post 2nd Covid Vaccine. Huge swelling of ankles post vaccine. Now improved, some swelling at end of day.   BP at home 135/80, 127/76. Taking it 4 times a day. Moving a lot.   In review, taking Eliquis, no bleeding episodes.  Atenolol, flecainide 25 mg twice a day very low-dose.  CHA2DS2-VASc 4.  Stopped pravastatin because of hair loss previously.  Dr. Tarri Glenn also sees her for diarrhea.  As far as antihypertensives go, she is on atenolol 50 twice daily.  No other agents.  Blood pressure 134/70 today.  Past Medical History:  Diagnosis Date  . Aortic regurgitation   . Cancer ([place])    basal cell  . Cataract   . COLONIC POLYPS, ADENOMATOUS 09/22/2007   Qualifier: Diagnosis of  By: Ronnald Ramp RN, CGRN, Sheri    . Costochondritis    occ. flares, last 3-4 weeks ago.  . Depression    mild  . Dysphagia, pharyngoesophageal phase 01/20/2015  . MITRAL VALVE PROLAPSE 09/22/2007   Qualifier: Diagnosis of  By: Ronnald Ramp RN, [place], [place]    . Multiple thyroid nodules    sees Dr. Conley Rolls every 6 months  . Palpitations   . Paroxysmal atrial fibrillation (HCC)    "palpitations"- occ.now-not frequent  . Temporomandibular joint-pain-dysfunction syndrome (TMJ) 11/16/2010    Past Surgical  History:  Procedure Laterality Date  . CATARACT EXTRACTION, BILATERAL    . COLONOSCOPY W/ POLYPECTOMY     last 12'15  . COSMETIC SURGERY     "facial"  . DILATION AND CURETTAGE OF UTERUS    . ESOPHAGOGASTRODUODENOSCOPY (EGD) WITH PROPOFOL N/A 01/20/2015   Procedure: ESOPHAGOGASTRODUODENOSCOPY (EGD) WITH PROPOFOL;  Surgeon: Lafayette Dragon, MD;  Location: [facility];  Service: Endoscopy;  Laterality: N/A;  . EXCISIONAL HEMORRHOIDECTOMY    . EYE SURGERY    . SAVORY DILATION N/A 01/20/2015   Procedure: SAVORY DILATION;  Surgeon: Lafayette Dragon, MD;  Location: [facility];  Service: Endoscopy;  Laterality: N/A;  . TUBAL LIGATION      Current Medications: Current Meds  Medication Sig  . acetaminophen (TYLENOL) 500 MG tablet Take 500 mg by mouth every 6 (six) hours as needed.  . ALPRAZolam (XANAX) 0.25 MG tablet Take 0.25 mg by mouth daily as needed for anxiety.  Marland Kitchen amLODipine (NORVASC) 5 MG tablet Take 5 mg by mouth daily.  Marland Kitchen atenolol (TENORMIN) 50 MG tablet Take 1 tablet (50 mg total) by mouth 2 (two) times daily. Please keep upcoming appt with Dr. Marlou Porch for February before anymore refills. Thank you  . B Complex Vitamins (VITAMIN B COMPLEX PO) Take 1 tablet by mouth once a day  . busPIRone (BUSPAR) 10 MG tablet Take 10 mg by mouth  2 (two) times daily.  Marland Kitchen ELIQUIS 5 MG TABS tablet TAKE 1 TABLET BY MOUTH TWICE DAILY  . flecainide (TAMBOCOR) 50 MG tablet TAKE 1 TABLET BY MOUTH TWICE DAILY (Patient taking differently: Take 25 mg by mouth 2 (two) times daily. )  . loperamide (IMODIUM) 2 MG capsule Take 2 capsules (4 mg total) by mouth as needed for diarrhea or loose stools.  . Multiple Vitamin (MULTIVITAMIN) tablet Take 1 tablet by mouth daily.    . pravastatin (PRAVACHOL) 10 MG tablet Take 5 mg by mouth 3 (three) times a week. Pt takes 5mg  every Monday, Wednesday and Friday.     Allergies:   Flecainide, Augmentin [amoxicillin-pot clavulanate], Pravastatin sodium, and Vioxx [rofecoxib]    Social History   Socioeconomic History  . Marital status: Married    Spouse name: Not on file  . Number of children: Not on file  . Years of education: Not on file  . Highest education level: Not on file  Occupational History  . Not on file  Tobacco Use  . Smoking status: Former Smoker    Types: Cigarettes  . Smokeless tobacco: Never Used  . Tobacco comment: social smoker -no use in 30 yrs  Substance and Sexual Activity  . Alcohol use: Yes    Comment: 2-3 times monthly shots of liquor/mixed drinks  . Drug use: No  . Sexual activity: Not on file  Other Topics Concern  . Not on file  Social History Narrative  . Not on file   Social Determinants of Health   Financial Resource Strain:   . Difficulty of Paying Living Expenses: Not on file  Food Insecurity:   . Worried About Charity fundraiser in the Last Year: Not on file  . Ran Out of Food in the Last Year: Not on file  Transportation Needs:   . Lack of Transportation (Medical): Not on file  . Lack of Transportation (Non-Medical): Not on file  Physical Activity:   . Days of Exercise per Week: Not on file  . Minutes of Exercise per Session: Not on file  Stress:   . Feeling of Stress : Not on file  Social Connections:   . Frequency of Communication with Friends and Family: Not on file  . Frequency of Social Gatherings with Friends and Family: Not on file  . Attends Religious Services: Not on file  . Active Member of Clubs or Organizations: Not on file  . Attends Archivist Meetings: Not on file  . Marital Status: Not on file     Family History: The patient's family history includes Cancer in her brother and father; Diabetes in her brother, brother, mother, sister, and sister; Kidney disease in her father.  ROS:   Please see the history of present illness.     All other systems reviewed and are negative.  EKGs/Labs/Other Studies Reviewed:    The following studies were reviewed today: Prior ETT and  echocardiogram were normal.  EKG: EKG today 09/04/2019 shows sinus rhythm 62 with no other abnormalities-prior EKG reviewed as well no significant change.  Recent Labs: 11/05/2018: BUN 13; Creatinine, Ser 0.98; Hemoglobin 14.2; Platelets 214.0; Potassium 3.6; Sodium 138; TSH 0.72  Recent Lipid Panel    Component Value Date/Time   CHOL 195 02/24/2018 0859   TRIG 150 (H) 02/24/2018 0859   HDL 81 02/24/2018 0859   CHOLHDL 2.4 02/24/2018 0859   LDLCALC 84 02/24/2018 0859    Physical Exam:    VS:  BP 134/70  Pulse 62   Ht 5\' 3"  (1.6 m)   Wt 104 lb 12.8 oz (47.5 kg)   BMI 18.56 kg/m     Wt Readings from Last 3 Encounters:  09/04/19 104 lb 12.8 oz (47.5 kg)  03/09/19 104 lb (47.2 kg)  11/21/18 102 lb (46.3 kg)     GEN:  Thin in no acute distress HEENT: Normal NECK: No JVD; No carotid bruits LYMPHATICS: No lymphadenopathy CARDIAC: RRR, no murmurs, rubs, gallops RESPIRATORY:  Clear to auscultation without rales, wheezing or rhonchi  ABDOMEN: Soft, non-tender, non-distended MUSCULOSKELETAL:  No edema; No deformity  SKIN: Warm and dry NEUROLOGIC:  Alert and oriented x 3 PSYCHIATRIC:  Normal affect   ASSESSMENT:    1. PAF (paroxysmal atrial fibrillation) (HCC)   2. Palpitations   3. Hypertension, unspecified type   4. Aortic valve insufficiency, etiology of cardiac valve disease unspecified    PLAN:    In order of problems listed above:  Sudden hypertension, questionable vascular source -Blood pressure today 134/70, excellent.  She is on one agent atenolol which is being used primarily in combination with her flecainide to help prevent atrial fibrillation episodes. -Now taking the addition of amlodipine 5 mg.  Minor ankle swelling at the end of the day, dependent edema. -Discussed salt reduction.  Alcohol. -We will go ahead and check a renal artery duplex to ensure that there is no evidence of renal artery stenosis or vascular phenomenon which could have led to her  significantly high blood pressure.  Certainly increased catecholamines or stress hormones can lead to this as well.  She is usually anxious around OB/GYN visits however that blood pressure remained elevated throughout the remainder of the day. -Creatinine from outside labs 1.0, potassium 4.6, ALT 18, LDL 134 Paroxysmal atrial fibrillation -Diagnosed February 15, 2019, 10 hours of A. fib.  Noted palpitations for years.  Very low-dose flecainide.  EKG normal.  Echo normal.  She did have some mild to moderate aortic valve regurgitation.  No recent episodes.  She understands to take an extra flecainide if necessary.  Chronic anticoagulation/secondary hypercoagulable state -With her CHA2DS2-VASc of 4, continue with Eliquis. -Has seen Dr. Rayann Heman.  History of TIA -Anticoagulation.  Mild to moderate aortic insufficiency -We will continue to monitor should be of no clinical consequence at this point. -I will check an echocardiogram since it has been 2 years.   Medication Adjustments/Labs and Tests Ordered: Current medicines are reviewed at length with the patient today.  Concerns regarding medicines are outlined above.  Orders Placed This Encounter  Procedures  . EKG 12-Lead  . ECHOCARDIOGRAM COMPLETE  . VAS US RENAL ARTERY DUPLEX   No orders of the defined types were placed in this encounter.   Patient Instructions  Medication Instructions:  NO CHANGES *If you need a refill on your cardiac medications before your next appointment, please call your pharmacy*  Lab Work: NONE If you have labs (blood work) drawn today and your tests are completely normal, you will receive your results only by: Marland Kitchen MyChart Message (if you have MyChart) OR . A paper copy in the mail If you have any lab test that is abnormal or we need to change your treatment, we will call you to review the results.  Testing/Procedures: Your physician has requested that you have an echocardiogram. Echocardiography is a painless  test that uses sound waves to create images of your heart. It provides your doctor with information about the size and shape of your heart and  how well your heart's chambers and valves are working. This procedure takes approximately one hour. There are no restrictions for this procedure.    Your physician has requested that you have a renal artery duplex. During this test, an ultrasound is used to evaluate blood flow to the kidneys. Allow one hour for this exam. Do not eat after midnight the day before and avoid carbonated beverages. Take your medications as you usually do.   Follow-Up: At [facility], you and your health needs are our priority.  As part of our continuing mission to provide you with exceptional heart care, we have created designated Provider Care Teams.  These Care Teams include your primary Cardiologist (physician) and Advanced Practice Providers (APPs -  Physician Assistants and Nurse Practitioners) who all work together to provide you with the care you need, when you need it.  Your next appointment:   YEAR  The format for your next appointment:   In Person  Provider:   You may see Candee Furbish, MD or one of the following Advanced Practice Providers on your designated Care Team:    Truitt Merle, NP  Cecilie Kicks, NP  Kathyrn Drown, NP   Other Instructions      Signed, Candee Furbish, MD  09/04/2019 9:09 AM    [place]

## 2019-09-08 ENCOUNTER — Ambulatory Visit (HOSPITAL_COMMUNITY)
Admission: RE | Admit: 2019-09-08 | Discharge: 2019-09-08 | Disposition: A | Discharge status: Home / Self Care | Payer: BC Managed Care – PPO | Source: Ambulatory Visit | Attending: Internal Medicine | Admitting: Internal Medicine

## 2019-09-08 ENCOUNTER — Other Ambulatory Visit: Payer: Self-pay

## 2019-09-08 DIAGNOSIS — I10 Essential (primary) hypertension: Secondary | ICD-10-CM | POA: Insufficient documentation

## 2019-09-09 ENCOUNTER — Ambulatory Visit: Payer: Self-pay | Admitting: Cardiology

## 2019-09-10 ENCOUNTER — Other Ambulatory Visit: Payer: Self-pay | Admitting: *Deleted

## 2019-09-16 ENCOUNTER — Ambulatory Visit (HOSPITAL_COMMUNITY): Payer: BC Managed Care – PPO | Attending: Cardiology

## 2019-09-16 ENCOUNTER — Other Ambulatory Visit: Payer: Self-pay

## 2019-09-16 ENCOUNTER — Telehealth: Payer: Self-pay

## 2019-09-16 DIAGNOSIS — I351 Nonrheumatic aortic (valve) insufficiency: Secondary | ICD-10-CM | POA: Insufficient documentation

## 2019-09-16 NOTE — Telephone Encounter (Signed)
Called and LVM to reschedule appt for 2/17 d/t weather.

## 2019-09-17 ENCOUNTER — Ambulatory Visit: Payer: BC Managed Care – PPO | Admitting: Cardiovascular Disease

## 2019-09-17 ENCOUNTER — Telehealth (INDEPENDENT_AMBULATORY_CARE_PROVIDER_SITE_OTHER): Payer: BC Managed Care – PPO | Admitting: Cardiovascular Disease

## 2019-09-17 DIAGNOSIS — R0602 Shortness of breath: Secondary | ICD-10-CM | POA: Diagnosis not present

## 2019-09-17 DIAGNOSIS — I150 Renovascular hypertension: Secondary | ICD-10-CM

## 2019-09-17 DIAGNOSIS — I10 Essential (primary) hypertension: Secondary | ICD-10-CM

## 2019-09-17 NOTE — Addendum Note (Signed)
Addended by: Cain Sieve on: 09/17/2019 09:57 AM   Modules accepted: Orders

## 2019-09-17 NOTE — Patient Instructions (Addendum)
Medication Instructions:  Your physician recommends that you continue on your current medications as directed. Please refer to the Current Medication list given to you today.  If you need a refill on your cardiac medications before your next appointment, please call your pharmacy.   Lab work: BMET If you have labs (blood work) drawn today and your tests are completely normal, you will receive your results only by: [place] (if you have MyChart) OR A paper copy in the mail If you have any lab test that is abnormal or we need to change your treatment, we will call you to review the results.  Testing/Procedures: Non-Cardiac CT scanning, (CAT scanning), is a noninvasive, special x-ray that produces cross-sectional images of the body using x-rays and a computer. CT scans help physicians diagnose and treat medical conditions. For some CT exams, a contrast material is used to enhance visibility in the area of the body being studied. CT scans provide greater clarity and reveal more details than regular x-ray exams.   Follow-Up: At [facility], you and your health needs are our priority.  As part of our continuing mission to provide you with exceptional heart care, we have created designated Provider Care Teams.  These Care Teams include your primary Cardiologist (physician) and Advanced Practice Providers (APPs -  Physician Assistants and Nurse Practitioners) who all work together to provide you with the care you need, when you need it. You may see Dr. Gwenlyn Found or one of the following Advanced Practice Providers on your designated Care Team:    Kerin Ransom, PA-C  [place], [place]  Coletta Memos, [place]  Your physician wants you to follow-up in: 3 months with Dr. Gwenlyn Found   Any Other Special Instructions Will Be Listed Below (If Applicable).  Your cardiac CT will be scheduled at:   [facility] [address] [place], [place] [address] [phone]   If scheduled at  [facility], please arrive at the [facility] main entrance of [facility] 30 minutes prior to test start time. Proceed to the [facility] Radiology Department (first floor) to check-in and test prep.   Please follow these instructions carefully (unless otherwise directed):  On the Night Before the Test: . Be sure to Drink plenty of water. . Do not consume any caffeinated/decaffeinated beverages or chocolate 12 hours prior to your test. . Do not take any antihistamines 12 hours prior to your test.  On the Day of the Test: . Drink plenty of water. Do not drink any water within one hour of the test. . Do not eat any food 4 hours prior to the test. . You may take your regular medications prior to the test.  . FEMALES- please wear underwire-free bra if available       After the Test: . Drink plenty of water. . After receiving IV contrast, you may experience a mild flushed feeling. This is normal. . On occasion, you may experience a mild rash up to 24 hours after the test. This is not dangerous. If this occurs, you can take Benadryl 25 mg and increase your fluid intake. . If you experience trouble breathing, this can be serious. If it is severe call 911 IMMEDIATELY. If it is mild, please call our office. . If you take any of these medications: Glipizide/Metformin, Avandament, Glucavance, please do not take 48 hours after completing test unless otherwise instructed.   Once we have confirmed authorization from your insurance company, we will call you to  set up a date and time for your test.   For non-scheduling related questions, please contact the cardiac imaging nurse navigator should you have any questions/concerns: Marchia Bond, RN Navigator Cardiac Imaging [facility] [phone] mobile

## 2019-09-17 NOTE — Progress Notes (Signed)
Virtual Visit via Video Note   This visit type was conducted due to national recommendations for restrictions regarding the COVID-19 Pandemic (e.g. social distancing) in an effort to limit this patient's exposure and mitigate transmission in our community.  Due to her co-morbid illnesses, this patient is at least at moderate risk for complications without adequate follow up.  This format is felt to be most appropriate for this patient at this time.  All issues noted in this document were discussed and addressed.  A limited physical exam was performed with this format.  Please refer to the patient's chart for her consent to telehealth for [facility].   Date:  09/17/2019   ID:  [name], DOB 1945/09/03, MRN [id number]  Patient Location: Home Provider Location: Home  PCP:  [name], MD  Cardiologist:  Candee Furbish, MD  Peripheral vascular cardiologist-Dr. [name] Electrophysiologist:  Lindholm Grayer, MD   Evaluation Performed:  Consultation - [name] was referred by Dr. Marlou Porch for the evaluation of renal vascular hypertension.  Chief Complaint: Hypertension  History of Present Illness:    Robin Collins is a 74 y.o. married Caucasian female with no children who is retired from being Clinical biochemist of Librarian, academic at [employer].  She was referred by Dr. Marlou Porch for evaluation and possible treatment of renal vascular hypertension.  Her cardiac risk factor profile is notable for treated hypertension on amlodipine and atenolol.  She has PAF followed by Dr. Rayann Heman in the past on flecainide and Eliquis.  She has had several TIAs as well.  She is never had a heart attack or stroke.  She denies chest pain or shortness of breath.  She had sudden spike in blood pressure in the fall and has been placed on 2 antihypertensive medications with blood pressures at home that normally run in the [id number] range systolic.  She had a renal Doppler study performed  09/08/2019 that revealed a slightly smaller right kidney measuring 8.9 cm compared to the left measuring 9.5 cm with a right renal aortic ratio of 4.52 suggesting a physiologically significant lesion with greater than 50%.  She does have normal renal function.   The patient does not have symptoms concerning for COVID-19 infection (fever, chills, cough, or new shortness of breath).    Past Medical History:  Diagnosis Date  . Aortic regurgitation   . Cancer ([place])    basal cell  . Cataract   . COLONIC POLYPS, ADENOMATOUS 09/22/2007   Qualifier: Diagnosis of  By: Robin Ramp RN, CGRN, Sheri    . Costochondritis    occ. flares, last 3-4 weeks ago.  . Depression    mild  . Dysphagia, pharyngoesophageal phase 01/20/2015  . MITRAL VALVE PROLAPSE 09/22/2007   Qualifier: Diagnosis of  By: Robin Ramp RN, [place], [place]    . Multiple thyroid nodules    sees Dr. Conley Rolls every 6 months  . Palpitations   . Paroxysmal atrial fibrillation (HCC)    "palpitations"- occ.now-not frequent  . Temporomandibular joint-pain-dysfunction syndrome (TMJ) 11/16/2010   Past Surgical History:  Procedure Laterality Date  . CATARACT EXTRACTION, BILATERAL    . COLONOSCOPY W/ POLYPECTOMY     last 12'15  . COSMETIC SURGERY     "facial"  . DILATION AND CURETTAGE OF UTERUS    . ESOPHAGOGASTRODUODENOSCOPY (EGD) WITH PROPOFOL N/A 01/20/2015   Procedure: ESOPHAGOGASTRODUODENOSCOPY (EGD) WITH PROPOFOL;  Surgeon: Lafayette Dragon, MD;  [facility];  Service: Endoscopy;  Laterality: N/A;  .  EXCISIONAL HEMORRHOIDECTOMY    . EYE SURGERY    . SAVORY DILATION N/A 01/20/2015   Procedure: SAVORY DILATION;  Surgeon: Lafayette Dragon, MD;  [facility];  Service: Endoscopy;  Laterality: N/A;  . TUBAL LIGATION       No outpatient medications have been marked as taking for the 09/17/19 encounter (Appointment) with Robin Harp, MD.     Allergies:   Flecainide, Augmentin [amoxicillin-pot clavulanate], Pravastatin sodium, and  Vioxx [rofecoxib]   Social History   Tobacco Use  . Smoking status: Former Smoker    Types: Cigarettes  . Smokeless tobacco: Never Used  . Tobacco comment: social smoker -no use in 30 yrs  Substance Use Topics  . Alcohol use: Yes    Comment: 2-3 times monthly shots of liquor/mixed drinks  . Drug use: No     Family Hx: The patient's family history includes Cancer in her brother and father; Diabetes in her brother, brother, mother, sister, and sister; Kidney disease in her father.  ROS:   Please see the history of present illness.     All other systems reviewed and are negative.   Prior CV studies:   The following studies were reviewed today:  Renal Doppler study performed 09/08/2019  Labs/Other Tests and Data Reviewed:    EKG:  No ECG reviewed.  Recent Labs: 11/05/2018: BUN 13; Creatinine, Ser 0.98; Hemoglobin 14.2; Platelets 214.0; Potassium 3.6; Sodium 138; TSH 0.72   Recent Lipid Panel Lab Results  Component Value Date/Time   CHOL 195 02/24/2018 08:59 AM   TRIG 150 (H) 02/24/2018 08:59 AM   HDL 81 02/24/2018 08:59 AM   CHOLHDL 2.4 02/24/2018 08:59 AM   LDLCALC 84 02/24/2018 08:59 AM    Wt Readings from Last 3 Encounters:  09/04/19 104 lb 12.8 oz (47.5 kg)  03/09/19 104 lb (47.2 kg)  11/21/18 102 lb (46.3 kg)     Objective:    Vital Signs:  There were no vitals taken for this visit.   VITAL SIGNS:  reviewed GEN:  no acute distress RESPIRATORY:  normal respiratory effort, symmetric expansion CARDIOVASCULAR:  no peripheral edema MUSCULOSKELETAL:  no obvious deformities. NEURO:  alert and oriented x 3, no obvious focal deficit PSYCH:  normal affect  ASSESSMENT & PLAN:    1. Hypertension-sudden spike in hypertension several months ago now controlled on amlodipine and atenolol with renal Doppler studies suggesting the possibility right renal artery stenosis with a right renal aortic ratio of 4.52 and a pole-to-pole dimension of 8.9 cm, 0.6 cm smaller than the  left.  I am going to get a abdominal CTA to further evaluate whether she has a significant right renal artery stenosis.  If she does not by CTA she can continue on her current medications.  If she does have a significant stenosis, her blood pressure seems to be well controlled on 2 medications and we will continue to follow her noninvasively.  COVID-19 Education: The signs and symptoms of COVID-19 were discussed with the patient and how to seek care for testing (follow up with PCP or arrange E-visit).  The importance of social distancing was discussed today.  Time:   Today, I have spent 15 minutes with the patient with telehealth technology discussing the above problems.     Medication Adjustments/Labs and Tests Ordered: Current medicines are reviewed at length with the patient today.  Concerns regarding medicines are outlined above.   Tests Ordered: No orders of the defined types were placed in this encounter.  Medication Changes: No orders of the defined types were placed in this encounter.   Follow Up:  In Person in 6 month(s)  Signed, [name], MD  09/17/2019 7:46 AM    [place]

## 2019-09-28 ENCOUNTER — Encounter: Payer: Self-pay | Admitting: Internal Medicine

## 2019-09-28 ENCOUNTER — Telehealth (INDEPENDENT_AMBULATORY_CARE_PROVIDER_SITE_OTHER): Payer: BC Managed Care – PPO | Admitting: Internal Medicine

## 2019-09-28 VITALS — BP 145/82 | HR 64 | Ht 63.0 in | Wt 104.0 lb

## 2019-09-28 DIAGNOSIS — I150 Renovascular hypertension: Secondary | ICD-10-CM

## 2019-09-28 DIAGNOSIS — I480 Paroxysmal atrial fibrillation: Secondary | ICD-10-CM | POA: Diagnosis not present

## 2019-09-28 DIAGNOSIS — I351 Nonrheumatic aortic (valve) insufficiency: Secondary | ICD-10-CM

## 2019-09-28 NOTE — Progress Notes (Signed)
Electrophysiology TeleHealth Note   Due to national recommendations of social distancing due to COVID 19, an audio/video telehealth visit is felt to be most appropriate for this patient at this time.  See MyChart message from today for the patient's consent to telehealth for [facility].  Date:  09/28/2019   ID:  Robin Collins, DOB 08-13-1945, MRN [id number]  Location: patient's home  Provider location:  [facility]  Evaluation Performed: Follow-up visit  PCP:  [name], MD   Electrophysiologist:  Dr Rayann Heman  Chief Complaint:  palpitations  History of Present Illness:    Robin Collins is a 74 y.o. female who presents via telehealth conferencing today.  Since last being seen in our clinic, the patient reports doing very well.  Today, she denies symptoms of palpitations, chest pain, shortness of breath,  lower extremity edema, dizziness, presyncope, or syncope.  The patient is otherwise without complaint today.  The patient denies symptoms of fevers, chills, cough, or new SOB worrisome for COVID 19.  Past Medical History:  Diagnosis Date  . Aortic regurgitation   . Cancer ([place])    basal cell  . Cataract   . COLONIC POLYPS, ADENOMATOUS 09/22/2007   Qualifier: Diagnosis of  By: Ronnald Ramp RN, CGRN, Sheri    . Costochondritis    occ. flares, last 3-4 weeks ago.  . Depression    mild  . Dysphagia, pharyngoesophageal phase 01/20/2015  . MITRAL VALVE PROLAPSE 09/22/2007   Qualifier: Diagnosis of  By: Ronnald Ramp RN, Damar, [place]    . Multiple thyroid nodules    sees Dr. Conley Rolls every 6 months  . Palpitations   . Paroxysmal atrial fibrillation (HCC)    "palpitations"- occ.now-not frequent  . Temporomandibular joint-pain-dysfunction syndrome (TMJ) 11/16/2010    Past Surgical History:  Procedure Laterality Date  . CATARACT EXTRACTION, BILATERAL    . COLONOSCOPY W/ POLYPECTOMY     last 12'15  . COSMETIC SURGERY     "facial"  . DILATION AND CURETTAGE OF UTERUS    .  ESOPHAGOGASTRODUODENOSCOPY (EGD) WITH PROPOFOL N/A 01/20/2015   Procedure: ESOPHAGOGASTRODUODENOSCOPY (EGD) WITH PROPOFOL;  Surgeon: Lafayette Dragon, MD;  Location: [facility];  Service: Endoscopy;  Laterality: N/A;  . EXCISIONAL HEMORRHOIDECTOMY    . EYE SURGERY    . SAVORY DILATION N/A 01/20/2015   Procedure: SAVORY DILATION;  Surgeon: Lafayette Dragon, MD;  Location: [facility];  Service: Endoscopy;  Laterality: N/A;  . TUBAL LIGATION      Current Outpatient Medications  Medication Sig Dispense Refill  . acetaminophen (TYLENOL) 500 MG tablet Take 500 mg by mouth every 6 (six) hours as needed.    . ALPRAZolam (XANAX) 0.25 MG tablet Take 0.25 mg by mouth daily as needed for anxiety.    Marland Kitchen amLODipine (NORVASC) 5 MG tablet Take 5 mg by mouth daily.    Marland Kitchen atenolol (TENORMIN) 50 MG tablet Take 1 tablet (50 mg total) by mouth 2 (two) times daily. Please keep upcoming appt with Dr. Marlou Porch for February before anymore refills. Thank you 180 tablet 0  . B Complex Vitamins (VITAMIN B COMPLEX PO) Take 1 tablet by mouth once a day    . busPIRone (BUSPAR) 10 MG tablet Take 10 mg by mouth 2 (two) times daily.    Marland Kitchen ELIQUIS 5 MG TABS tablet TAKE 1 TABLET BY MOUTH TWICE DAILY 180 tablet 1  . flecainide (TAMBOCOR) 50 MG tablet TAKE 1 TABLET BY MOUTH TWICE DAILY 180 tablet 3  .  loperamide (IMODIUM) 2 MG capsule Take 2 capsules (4 mg total) by mouth as needed for diarrhea or loose stools. 30 capsule 0  . Multiple Vitamin (MULTIVITAMIN) tablet Take 1 tablet by mouth daily.      . pravastatin (PRAVACHOL) 10 MG tablet Take 5 mg by mouth 3 (three) times a week. Pt takes 5mg  every Monday, Wednesday and Friday.     No current facility-administered medications for this visit.    Allergies:   Augmentin [amoxicillin-pot clavulanate] and Vioxx [rofecoxib]   Social History:  The patient  reports that she has quit smoking. Her smoking use included cigarettes. She has never used smokeless tobacco. She reports current alcohol  use. She reports that she does not use drugs.   Family History:  The patient's family history includes Cancer in her brother and father; Diabetes in her brother, brother, mother, sister, and sister; Kidney disease in her father.   ROS:  Please see the history of present illness.   All other systems are personally reviewed and negative.    Exam:    Vital Signs:  BP (!) 145/82   Pulse 64   Ht 5\' 3"  (1.6 m)   Wt 104 lb (47.2 kg)   BMI 18.42 kg/m   Well sounding and appearing, alert and conversant, regular work of breathing,  good skin color Eyes- anicteric, neuro- grossly intact, skin- no apparent rash or lesions or cyanosis, mouth- oral mucosa is pink  Labs/Other Tests and Data Reviewed:    Recent Labs: 11/05/2018: BUN 13; Creatinine, Ser 0.98; Hemoglobin 14.2; Platelets 214.0; Potassium 3.6; Sodium 138; TSH 0.72   Wt Readings from Last 3 Encounters:  09/28/19 104 lb (47.2 kg)  09/17/19 104 lb (47.2 kg)  09/04/19 104 lb 12.8 oz (47.5 kg)    Echo 09/16/19- mild AI  Echo 09/04/19-sinus   ASSESSMENT & PLAN:    1.  Paroxysmal atrial fibrillation Well controlled with flecainide chads2vasc score is 4.  She is on eliquis  2. Moderate AI Followed by Dr Marlou Porch  3. Hypertensive cardiovascular disease Stable No change required today Dr Kennon Holter note reviewed   Follow-up:  12 months with EP PA   Patient Risk:  after full review of this patients clinical status, I feel that they are at moderate risk at this time.  Today, I have spent 15 minutes with the patient with telehealth technology discussing arrhythmia management .    SignedThompson Grayer, MD  09/28/2019 3:52 PM     [address] [place] [address] [phone] (office) [phone] (fax)

## 2019-10-01 DIAGNOSIS — I4891 Unspecified atrial fibrillation: Secondary | ICD-10-CM | POA: Diagnosis not present

## 2019-10-01 DIAGNOSIS — F419 Anxiety disorder, unspecified: Secondary | ICD-10-CM | POA: Diagnosis not present

## 2019-10-01 DIAGNOSIS — Z23 Encounter for immunization: Secondary | ICD-10-CM | POA: Diagnosis not present

## 2019-10-01 DIAGNOSIS — R609 Edema, unspecified: Secondary | ICD-10-CM | POA: Diagnosis not present

## 2019-10-01 DIAGNOSIS — Z0000 Encounter for general adult medical examination without abnormal findings: Secondary | ICD-10-CM | POA: Diagnosis not present

## 2019-10-01 DIAGNOSIS — I701 Atherosclerosis of renal artery: Secondary | ICD-10-CM | POA: Diagnosis not present

## 2019-10-15 ENCOUNTER — Telehealth: Payer: Self-pay | Admitting: *Deleted

## 2019-10-15 DIAGNOSIS — H3562 Retinal hemorrhage, left eye: Secondary | ICD-10-CM

## 2019-10-15 NOTE — Telephone Encounter (Signed)
Pt  has been scheduled at [facility] for 3/26.

## 2019-10-15 NOTE — Telephone Encounter (Signed)
Received office visit note from Dr Marshall Cork with [facility] requesting pt have a carotid doppler due to a retinal hemorrhage in the left eye.  Per Dr Candee Furbish - pt is ordered to have a carotid doppler.

## 2019-10-16 ENCOUNTER — Other Ambulatory Visit: Payer: Self-pay

## 2019-10-16 ENCOUNTER — Other Ambulatory Visit: Payer: BC Managed Care – PPO | Admitting: *Deleted

## 2019-10-16 DIAGNOSIS — I150 Renovascular hypertension: Secondary | ICD-10-CM | POA: Diagnosis not present

## 2019-10-16 DIAGNOSIS — R0602 Shortness of breath: Secondary | ICD-10-CM | POA: Diagnosis not present

## 2019-10-16 LAB — BASIC METABOLIC PANEL
BUN/Creatinine Ratio: 16 (ref 12–28)
BUN: 22 mg/dL (ref 8–27)
CO2: 28 mmol/L (ref 20–29)
Calcium: 10.4 mg/dL — ABNORMAL HIGH (ref 8.7–10.3)
Chloride: 92 mmol/L — ABNORMAL LOW (ref 96–106)
Creatinine, Ser: 1.4 mg/dL — ABNORMAL HIGH (ref 0.57–1.00)
GFR calc Af Amer: 42 mL/min/{1.73_m2} — ABNORMAL LOW (ref >59)
GFR calc non Af Amer: 37 mL/min/{1.73_m2} — ABNORMAL LOW (ref >59)
Glucose: 101 mg/dL — ABNORMAL HIGH (ref 65–99)
Potassium: 4 mmol/L (ref 3.5–5.2)
Sodium: 137 mmol/L (ref 134–144)

## 2019-10-19 ENCOUNTER — Ambulatory Visit: Payer: BC Managed Care – PPO | Admitting: Internal Medicine

## 2019-10-21 ENCOUNTER — Encounter (HOSPITAL_COMMUNITY): Payer: Self-pay

## 2019-10-22 ENCOUNTER — Ambulatory Visit (HOSPITAL_COMMUNITY): Admission: RE | Admit: 2019-10-22 | Payer: BC Managed Care – PPO | Source: Ambulatory Visit

## 2019-10-22 ENCOUNTER — Other Ambulatory Visit: Payer: Self-pay | Admitting: *Deleted

## 2019-10-22 ENCOUNTER — Telehealth (HOSPITAL_COMMUNITY): Payer: Self-pay | Admitting: Emergency Medicine

## 2019-10-22 DIAGNOSIS — I150 Renovascular hypertension: Secondary | ICD-10-CM

## 2019-10-22 NOTE — Telephone Encounter (Signed)
Phone call with patient to discuss why her cardiac CTA was cancelled.   It was explained that I discovered that the wrong test was ordered as it related to the most recent office note from Dr. Gwenlyn Found who mentions RENAL stenosis and requested an ABDOMINAL CTA, not a coronary CTA.   I told her that once the new order for the abd CTA was entered, it would then be pre-auth'd and then someone would be in contact to schedule with her.  She appreciated the explaination.  Marchia Bond RN Navigator Cardiac Imaging [facility] [phone] Office  [phone] Cell

## 2019-10-23 ENCOUNTER — Ambulatory Visit (HOSPITAL_COMMUNITY)
Admission: RE | Admit: 2019-10-23 | Discharge: 2019-10-23 | Disposition: A | Discharge status: Home / Self Care | Payer: BC Managed Care – PPO | Source: Ambulatory Visit | Attending: Internal Medicine | Admitting: Internal Medicine

## 2019-10-23 ENCOUNTER — Other Ambulatory Visit: Payer: Self-pay

## 2019-10-23 DIAGNOSIS — H3562 Retinal hemorrhage, left eye: Secondary | ICD-10-CM

## 2019-11-02 ENCOUNTER — Encounter (HOSPITAL_COMMUNITY): Payer: Self-pay

## 2019-11-02 ENCOUNTER — Ambulatory Visit (HOSPITAL_COMMUNITY): Payer: BC Managed Care – PPO

## 2019-11-09 ENCOUNTER — Telehealth: Payer: Self-pay | Admitting: Cardiovascular Disease

## 2019-11-09 NOTE — Telephone Encounter (Signed)
New Message    Pt is calling and says [facility] called her and told her she needs to have Prior Auth on the CT that is scheduled for tomorrow     Please call

## 2019-11-09 NOTE — Telephone Encounter (Signed)
Follow up   Lalette from [facility] is calling to follow up on a Prior Auth for the pt, they need the Auth today to avoid cancellation of appt.     Please advise

## 2019-11-09 NOTE — Telephone Encounter (Signed)
Message routed to pre-cert

## 2019-11-10 ENCOUNTER — Ambulatory Visit
Admission: RE | Admit: 2019-11-10 | Discharge: 2019-11-10 | Disposition: A | Discharge status: Home / Self Care | Payer: BC Managed Care – PPO | Source: Ambulatory Visit | Attending: Cardiovascular Disease | Admitting: Cardiovascular Disease

## 2019-11-10 DIAGNOSIS — I150 Renovascular hypertension: Secondary | ICD-10-CM

## 2019-11-10 DIAGNOSIS — I700 Atherosclerosis of aorta: Secondary | ICD-10-CM | POA: Diagnosis not present

## 2019-11-10 MED ORDER — IOPAMIDOL (ISOVUE-370) INJECTION 76%
50.0000 mL | Freq: Once | INTRAVENOUS | Status: AC | PRN
  Administered 2019-11-10: 50 mL via INTRAVENOUS

## 2019-11-10 MED ORDER — ATENOLOL 50 MG PO TABS: 50.0000 mg | ORAL_TABLET | Freq: Two times a day (BID) | ORAL | 3 refills | Status: DC

## 2019-11-20 ENCOUNTER — Other Ambulatory Visit: Payer: Self-pay

## 2019-11-20 ENCOUNTER — Encounter: Payer: Self-pay | Admitting: Cardiovascular Disease

## 2019-11-20 ENCOUNTER — Ambulatory Visit (INDEPENDENT_AMBULATORY_CARE_PROVIDER_SITE_OTHER): Payer: BC Managed Care – PPO | Admitting: Cardiovascular Disease

## 2019-11-20 ENCOUNTER — Other Ambulatory Visit: Payer: Self-pay | Admitting: *Deleted

## 2019-11-20 DIAGNOSIS — I701 Atherosclerosis of renal artery: Secondary | ICD-10-CM

## 2019-11-20 MED ORDER — SODIUM CHLORIDE 0.9% FLUSH: 3.0000 mL | Freq: Two times a day (BID) | INTRAVENOUS | Status: AC

## 2019-11-20 NOTE — Progress Notes (Signed)
11/20/2019 Robin Collins   10/15/1945  [id number]  Primary Physician [name], MD Primary Cardiologist: Lorretta Harp MD [name], [place]  HPI:  Robin Collins is a 74 y.o.  married Caucasian female with no children who is retired from being Clinical biochemist of Librarian, academic at [employer].  She was referred by Dr. Marlou Porch for evaluation and possible treatment of renal vascular hypertension.    I saw her last for a virtual telemedicine video visit 09/17/2019.  Her cardiac risk factor profile is notable for treated hypertension on amlodipine and atenolol.  She has PAF followed by Dr. Rayann Heman in the past on flecainide and Eliquis.  She has had several TIAs as well.  She is never had a heart attack or stroke.  She denies chest pain or shortness of breath.  She had sudden spike in blood pressure in the fall and has been placed on 2 antihypertensive medications with blood pressures at home that normally run in the [id number] range systolic.  She had a renal Doppler study performed 09/08/2019 that revealed a slightly smaller right kidney measuring 8.9 cm compared to the left measuring 9.5 cm with a right renal aortic ratio of 4.52 suggesting a physiologically significant lesion with greater than 50%.  She does have normal renal function.  I obtain at CTA of her abdomen revealing what appears to be a  significant greater than 75% right renal artery stenosis.  Based on this, her Doppler findings and the fact that her right kidney is somewhat smaller than the left in the setting of hypertension controlled on 3 antihypertensive medications, we decided to proceed with renal angiography potential intervention for treatment of renal vascular hypertension and renal preservation.     Current Meds  Medication Sig  . acetaminophen (TYLENOL) 500 MG tablet Take 500 mg by mouth every 6 (six) hours as needed.  . ALPRAZolam (XANAX) 0.25 MG tablet Take 0.25 mg by mouth daily as needed  for anxiety.  Marland Kitchen amLODipine (NORVASC) 5 MG tablet Take 5 mg by mouth daily.  Marland Kitchen atenolol (TENORMIN) 50 MG tablet Take 1 tablet (50 mg total) by mouth 2 (two) times daily.  . B Complex Vitamins (VITAMIN B COMPLEX PO) Take 1 tablet by mouth once a day  . busPIRone (BUSPAR) 10 MG tablet Take 10 mg by mouth 2 (two) times daily.  . chlorthalidone (HYGROTON) 25 MG tablet Take 25 mg by mouth daily. Take 1/2 tablet daily  . ELIQUIS 5 MG TABS tablet TAKE 1 TABLET BY MOUTH TWICE DAILY  . flecainide (TAMBOCOR) 50 MG tablet TAKE 1 TABLET BY MOUTH TWICE DAILY  . loperamide (IMODIUM) 2 MG capsule Take 2 capsules (4 mg total) by mouth as needed for diarrhea or loose stools.  . Multiple Vitamin (MULTIVITAMIN) tablet Take 1 tablet by mouth daily.    . pravastatin (PRAVACHOL) 10 MG tablet Take 5 mg by mouth 3 (three) times a week. Pt takes 5mg  every Monday, Wednesday and Friday.     Allergies  Allergen Reactions  . Augmentin [Amoxicillin-Pot Clavulanate] Nausea And Vomiting  . Vioxx [Rofecoxib]     palpitations    Social History   Socioeconomic History  . Marital status: Married    Spouse name: Not on file  . Number of children: Not on file  . Years of education: Not on file  . Highest education level: Not on file  Occupational History  . Not on file  Tobacco Use  .  Smoking status: Former Smoker    Types: Cigarettes  . Smokeless tobacco: Never Used  . Tobacco comment: social smoker -no use in 30 yrs  Substance and Sexual Activity  . Alcohol use: Yes    Comment: 2-3 times monthly shots of liquor/mixed drinks  . Drug use: No  . Sexual activity: Not on file  Other Topics Concern  . Not on file  Social History Narrative  . Not on file   Social Determinants of Health   Financial Resource Strain:   . Difficulty of Paying Living Expenses:   Food Insecurity:   . Worried About Charity fundraiser in the Last Year:   . Arboriculturist in the Last Year:   Transportation Needs:   . Lexicographer (Medical):   Marland Kitchen Lack of Transportation (Non-Medical):   Physical Activity:   . Days of Exercise per Week:   . Minutes of Exercise per Session:   Stress:   . Feeling of Stress :   Social Connections:   . Frequency of Communication with Friends and Family:   . Frequency of Social Gatherings with Friends and Family:   . Attends Religious Services:   . Active Member of Clubs or Organizations:   . Attends Archivist Meetings:   Marland Kitchen Marital Status:   Intimate Partner Violence:   . Fear of Current or Ex-Partner:   . Emotionally Abused:   Marland Kitchen Physically Abused:   . Sexually Abused:      Review of Systems: General: negative for chills, fever, night sweats or weight changes.  Cardiovascular: negative for chest pain, dyspnea on exertion, edema, orthopnea, palpitations, paroxysmal nocturnal dyspnea or shortness of breath Dermatological: negative for rash Respiratory: negative for cough or wheezing Urologic: negative for hematuria Abdominal: negative for nausea, vomiting, diarrhea, bright red blood per rectum, melena, or hematemesis Neurologic: negative for visual changes, syncope, or dizziness All other systems reviewed and are otherwise negative except as noted above.    Blood pressure 124/78, pulse 64, height 5\' 3"  (1.6 m), weight 103 lb (46.7 kg), SpO2 99 %.  General appearance: alert and no distress Neck: no adenopathy, no carotid bruit, no JVD, supple, symmetrical, trachea midline and thyroid not enlarged, symmetric, no tenderness/mass/nodules Lungs: clear to auscultation bilaterally Heart: regular rate and rhythm, S1, S2 normal, no murmur, click, rub or gallop Extremities: extremities normal, atraumatic, no cyanosis or edema Pulses: 2+ and symmetric Skin: Skin color, texture, turgor normal. No rashes or lesions Neurologic: Alert and oriented X 3, normal strength and tone. Normal symmetric reflexes. Normal coordination and gait  EKG not performed  today  ASSESSMENT AND PLAN:   Renal artery stenosis ([place]) Ms. Ringor was referred to me by Dr. Marlou Porch for evaluation treatment of renal vascular hypertension.  Dr. Rayann Heman is her electrophysiologist.  She is on 3 antihypertensive medications.  She had renal Doppler studies performed 09/08/2019 revealing a relatively small right kidney with a renal aortic ratio of 4.52 and a CTA performed 11/10/2019 that suggested a significant lesion at the origin of her right renal artery.  Her blood pressures have been fairly well controlled on 3 medications.  Because of the asymmetric size of her kidneys and the fact that she is on 3 antihypertensive medications I am concerned that she has a tight lesion which may ultimately lead to occlusion of her renal artery and loss of the right kidney.  We decided to proceed with angiography potential intervention.  She will need to stop her  Eliquis 2 days prior to the procedure.      Lorretta Harp MD FACP,FACC,FAHA, [facility] 11/20/2019 12:41 PM

## 2019-11-20 NOTE — Assessment & Plan Note (Signed)
Robin Collins was referred to me by Dr. Marlou Porch for evaluation treatment of renal vascular hypertension.  Dr. Rayann Heman is her electrophysiologist.  She is on 3 antihypertensive medications.  She had renal Doppler studies performed 09/08/2019 revealing a relatively small right kidney with a renal aortic ratio of 4.52 and a CTA performed 11/10/2019 that suggested a significant lesion at the origin of her right renal artery.  Her blood pressures have been fairly well controlled on 3 medications.  Because of the asymmetric size of her kidneys and the fact that she is on 3 antihypertensive medications I am concerned that she has a tight lesion which may ultimately lead to occlusion of her renal artery and loss of the right kidney.  We decided to proceed with angiography potential intervention.  She will need to stop her Eliquis 2 days prior to the procedure.

## 2019-11-20 NOTE — Patient Instructions (Addendum)
    [address] [place] [address] Dept: [phone] Loc: [place]  11/20/2019  You are scheduled for a Peripheral Angiogram on Monday, May 10 with Dr. [name].  1. Please arrive at the [facility] (Main Entrance A) at [facility]: [address] [place], [place] [address] at 7:30 AM (This time is two hours before your procedure to ensure your preparation). Free valet parking service is available.   Special note: Every effort is made to have your procedure done on time. Please understand that emergencies sometimes delay scheduled procedures.  2. Diet: Do not eat solid foods after midnight.  The patient may have clear liquids until 5am upon the day of the procedure.  3. Labs: You will need to have blood drawn on Thursday, May 6 at [place]  Open: 8am - 5pm (Lunch 12:30 - 1:30)   Phone: [phone]. You do not need to be fasting.  Covid test Thursday May 6th at 11:30 AM  4. Medication instructions in preparation for your procedure:   Contrast Allergy: No  Stop taking Eliquis (Apixiban) on Saturday, May 8.  No Eliquis Saturday or Sunday   Hold chlorthalidone AM of procedure  On the morning of your procedure, take your Aspirin and any morning medicines NOT listed above.  You may use sips of water.  5. Plan for one night stay--bring personal belongings. 6. Bring a current list of your medications and current insurance cards. 7. You MUST have a responsible person to drive you home. 8. Someone MUST be with you the first 24 hours after you arrive home or your discharge will be delayed. 9. Please wear clothes that are easy to get on and off and wear slip-on shoes.  Thank you for allowing [place] to care for you!   -- [place] [facility]  Your physician has requested that you have a  renal artery duplex 1 week after procedure. During this test, an ultrasound is used to evaluate blood flow to the kidneys. Allow one hour for this exam. Do not eat after midnight the day before and avoid carbonated beverages. Take your medications as you usually do.  Follow up with Dr. Gwenlyn Found 2 weeks after procedure

## 2019-11-20 NOTE — H&P (View-Only) (Signed)
11/20/2019 [name]   [birth year]  [id number]  Primary Physician Robin Cruel, MD Primary Cardiologist: Robin Harp MD Robin Collins, [place]  HPI:  Robin Collins is a 74 y.o.  married Caucasian female with no children who is retired from being Clinical biochemist of Librarian, academic at [employer].  She was referred by Dr. Marlou Collins for evaluation and possible treatment of renal vascular hypertension.    I saw her last for a virtual telemedicine video visit 09/17/2019.  Her cardiac risk factor profile is notable for treated hypertension on amlodipine and atenolol.  She has PAF followed by Dr. Rayann Collins in the past on flecainide and Eliquis.  She has had several TIAs as well.  She is never had a heart attack or stroke.  She denies chest pain or shortness of breath.  She had sudden spike in blood pressure in the fall and has been placed on 2 antihypertensive medications with blood pressures at home that normally run in the A999333 range systolic.  She had a renal Doppler study performed 09/08/2019 that revealed a slightly smaller right kidney measuring 8.9 cm compared to the left measuring 9.5 cm with a right renal aortic ratio of 4.52 suggesting a physiologically significant lesion with greater than 50%.  She does have normal renal function.  I obtain at CTA of her abdomen revealing what appears to be a  significant greater than 75% right renal artery stenosis.  Based on this, her Doppler findings and the fact that her right kidney is somewhat smaller than the left in the setting of hypertension controlled on 3 antihypertensive medications, we decided to proceed with renal angiography potential intervention for treatment of renal vascular hypertension and renal preservation.     Current Meds  Medication Sig  . acetaminophen (TYLENOL) 500 MG tablet Take 500 mg by mouth every 6 (six) hours as needed.  . ALPRAZolam (XANAX) 0.25 MG tablet Take 0.25 mg by mouth daily as needed  for anxiety.  Marland Kitchen amLODipine (NORVASC) 5 MG tablet Take 5 mg by mouth daily.  Marland Kitchen atenolol (TENORMIN) 50 MG tablet Take 1 tablet (50 mg total) by mouth 2 (two) times daily.  . B Complex Vitamins (VITAMIN B COMPLEX PO) Take 1 tablet by mouth once a day  . busPIRone (BUSPAR) 10 MG tablet Take 10 mg by mouth 2 (two) times daily.  . chlorthalidone (HYGROTON) 25 MG tablet Take 25 mg by mouth daily. Take 1/2 tablet daily  . ELIQUIS 5 MG TABS tablet TAKE 1 TABLET BY MOUTH TWICE DAILY  . flecainide (TAMBOCOR) 50 MG tablet TAKE 1 TABLET BY MOUTH TWICE DAILY  . loperamide (IMODIUM) 2 MG capsule Take 2 capsules (4 mg total) by mouth as needed for diarrhea or loose stools.  . Multiple Vitamin (MULTIVITAMIN) tablet Take 1 tablet by mouth daily.    . pravastatin (PRAVACHOL) 10 MG tablet Take 5 mg by mouth 3 (three) times a week. Pt takes 5mg  every Monday, Wednesday and Friday.     Allergies  Allergen Reactions  . Augmentin [Amoxicillin-Pot Clavulanate] Nausea And Vomiting  . Vioxx [Rofecoxib]     palpitations    Social History   Socioeconomic History  . Marital status: Married    Spouse name: Not on file  . Number of children: Not on file  . Years of education: Not on file  . Highest education level: Not on file  Occupational History  . Not on file  Tobacco Use  .  Smoking status: Former Smoker    Types: Cigarettes  . Smokeless tobacco: Never Used  . Tobacco comment: social smoker -no use in 30 yrs  Substance and Sexual Activity  . Alcohol use: Yes    Comment: 2-3 times monthly shots of liquor/mixed drinks  . Drug use: No  . Sexual activity: Not on file  Other Topics Concern  . Not on file  Social History Narrative  . Not on file   Social Determinants of Health   Financial Resource Strain:   . Difficulty of Paying Living Expenses:   Food Insecurity:   . Worried About Charity fundraiser in the Last Year:   . Arboriculturist in the Last Year:   Transportation Needs:   . Lexicographer (Medical):   Marland Kitchen Lack of Transportation (Non-Medical):   Physical Activity:   . Days of Exercise per Week:   . Minutes of Exercise per Session:   Stress:   . Feeling of Stress :   Social Connections:   . Frequency of Communication with Friends and Family:   . Frequency of Social Gatherings with Friends and Family:   . Attends Religious Services:   . Active Member of Clubs or Organizations:   . Attends Archivist Meetings:   Marland Kitchen Marital Status:   Intimate Partner Violence:   . Fear of Current or Ex-Partner:   . Emotionally Abused:   Marland Kitchen Physically Abused:   . Sexually Abused:      Review of Systems: General: negative for chills, fever, night sweats or weight changes.  Cardiovascular: negative for chest pain, dyspnea on exertion, edema, orthopnea, palpitations, paroxysmal nocturnal dyspnea or shortness of breath Dermatological: negative for rash Respiratory: negative for cough or wheezing Urologic: negative for hematuria Abdominal: negative for nausea, vomiting, diarrhea, bright red blood per rectum, melena, or hematemesis Neurologic: negative for visual changes, syncope, or dizziness All other systems reviewed and are otherwise negative except as noted above.    Blood pressure 124/78, pulse 64, height 5\' 3"  (1.6 m), weight 103 lb (46.7 kg), SpO2 99 %.  General appearance: alert and no distress Neck: no adenopathy, no carotid bruit, no JVD, supple, symmetrical, trachea midline and thyroid not enlarged, symmetric, no tenderness/mass/nodules Lungs: clear to auscultation bilaterally Heart: regular rate and rhythm, S1, S2 normal, no murmur, click, rub or gallop Extremities: extremities normal, atraumatic, no cyanosis or edema Pulses: 2+ and symmetric Skin: Skin color, texture, turgor normal. No rashes or lesions Neurologic: Alert and oriented X 3, normal strength and tone. Normal symmetric reflexes. Normal coordination and gait  EKG not performed  today  ASSESSMENT AND PLAN:   Renal artery stenosis ([place]) Robin Collins was referred to me by Dr. Marlou Collins for evaluation treatment of renal vascular hypertension.  Dr. Rayann Collins is her electrophysiologist.  She is on 3 antihypertensive medications.  She had renal Doppler studies performed 09/08/2019 revealing a relatively small right kidney with a renal aortic ratio of 4.52 and a CTA performed 11/10/2019 that suggested a significant lesion at the origin of her right renal artery.  Her blood pressures have been fairly well controlled on 3 medications.  Because of the asymmetric size of her kidneys and the fact that she is on 3 antihypertensive medications I am concerned that she has a tight lesion which may ultimately lead to occlusion of her renal artery and loss of the right kidney.  We decided to proceed with angiography potential intervention.  She will need to stop her  Eliquis 2 days prior to the procedure.      Robin Harp MD FACP,FACC,FAHA, [facility] 11/20/2019 12:41 PM

## 2019-11-26 ENCOUNTER — Telehealth: Payer: Self-pay

## 2019-11-26 NOTE — Telephone Encounter (Signed)
Patient sent mychart message regarding some questions-   Good morning, my husband, Stefan Church, has some questions as follow-up to my visit with Dr. Gwenlyn Found on Friday, May 23, regarding the stenosis in the renal artery to my right kidney.  1.  What is the primary cause(s) of the blockage in my artery? 2.  Why do I have a blockage here  and, apparently, not in other arteries? 3.  What should I do to prevent, or at least mitigate, additional blockages?  Thank you!  Robin Collins DOB 1945-08-06

## 2019-11-26 NOTE — Telephone Encounter (Signed)
Sent back via [employer].   Thank you!

## 2019-11-26 NOTE — Telephone Encounter (Signed)
The cause of the blockage is most likely atherosclerotic.  There is no way to determine why a blockage happens in 1 place versus another.  Mitigating future blockages requires addressing the risk factors which include cholesterol, diabetes, hypertension and tobacco abuse if those are relevant.

## 2019-12-03 ENCOUNTER — Other Ambulatory Visit (HOSPITAL_COMMUNITY)
Admission: RE | Admit: 2019-12-03 | Discharge: 2019-12-03 | Disposition: A | Discharge status: Home / Self Care | Payer: BC Managed Care – PPO | Source: Ambulatory Visit | Attending: Cardiovascular Disease | Admitting: Cardiovascular Disease

## 2019-12-03 ENCOUNTER — Telehealth: Payer: Self-pay | Admitting: *Deleted

## 2019-12-03 DIAGNOSIS — Z20822 Contact with and (suspected) exposure to covid-19: Secondary | ICD-10-CM | POA: Insufficient documentation

## 2019-12-03 DIAGNOSIS — Z01812 Encounter for preprocedural laboratory examination: Secondary | ICD-10-CM | POA: Diagnosis not present

## 2019-12-03 DIAGNOSIS — I701 Atherosclerosis of renal artery: Secondary | ICD-10-CM | POA: Diagnosis not present

## 2019-12-03 LAB — SARS CORONAVIRUS 2 (TAT 6-24 HRS): SARS Coronavirus 2: NEGATIVE

## 2019-12-03 NOTE — Telephone Encounter (Signed)
Pt contacted pre-renal angiogram scheduled at [facility] for: Monday Dec 07, 2019 9:30 AM Verified arrival time and place: [place] [facility]) at: 7:30 AM   No solid food after midnight prior to cath, clear liquids until 5 AM day of procedure.  Hold: Eliquis-none 12/05/19 until post procedure Chlorthalidone-AM of procedure.  Except hold medications AM meds can be  taken pre-cath with sip of water including: ASA 81 mg   Confirmed patient has responsible adult to drive home post procedure and observe 24 hours after arriving home: yes  You are allowed ONE visitor in the waiting room during your procedures. Both you and your visitor must wear masks.      COVID-19 Pre-Screening Questions:  . In the past 7 to 10 days have you had a cough,  shortness of breath, headache, congestion, fever (100 or greater) body aches, chills, sore throat, or sudden loss of taste or sense of smell? no . Have you been around anyone with known Covid 19 in the past 7 to 10 days? no . Have you been around anyone who is awaiting Covid 19 test results in the past 7 to 10 days? no . Have you been around anyone who  has mentioned symptoms of Covid 19 within the past 7 to 10 days? No  Reviewed procedure/mask/visitor instructions, COVID-19 screening questions with patient.

## 2019-12-04 LAB — CBC
Hematocrit: 40.9 % (ref 34.0–46.6)
Hemoglobin: 13.9 g/dL (ref 11.1–15.9)
MCH: 32.8 pg (ref 26.6–33.0)
MCHC: 34 g/dL (ref 31.5–35.7)
MCV: 97 fL (ref 79–97)
Platelets: 248 10*3/uL (ref 150–450)
RBC: 4.24 x10E6/uL (ref 3.77–5.28)
RDW: 12.8 % (ref 11.7–15.4)
WBC: 7 10*3/uL (ref 3.4–10.8)

## 2019-12-04 LAB — BASIC METABOLIC PANEL
BUN/Creatinine Ratio: 13 (ref 12–28)
BUN: 16 mg/dL (ref 8–27)
CO2: 28 mmol/L (ref 20–29)
Calcium: 10.4 mg/dL — ABNORMAL HIGH (ref 8.7–10.3)
Chloride: 96 mmol/L (ref 96–106)
Creatinine, Ser: 1.2 mg/dL — ABNORMAL HIGH (ref 0.57–1.00)
GFR calc Af Amer: 51 mL/min/{1.73_m2} — ABNORMAL LOW (ref >59)
GFR calc non Af Amer: 44 mL/min/{1.73_m2} — ABNORMAL LOW (ref >59)
Glucose: 99 mg/dL (ref 65–99)
Potassium: 4.9 mmol/L (ref 3.5–5.2)
Sodium: 139 mmol/L (ref 134–144)

## 2019-12-07 ENCOUNTER — Encounter (HOSPITAL_COMMUNITY)
Admission: RE | Disposition: A | Discharge status: Home / Self Care | Payer: Self-pay | Source: Home / Self Care | Attending: Cardiovascular Disease

## 2019-12-07 ENCOUNTER — Ambulatory Visit (HOSPITAL_COMMUNITY)
Admission: RE | Admit: 2019-12-07 | Discharge: 2019-12-07 | Disposition: A | Discharge status: Home / Self Care | Payer: BC Managed Care – PPO | Attending: Cardiovascular Disease | Admitting: Cardiovascular Disease

## 2019-12-07 ENCOUNTER — Other Ambulatory Visit: Payer: Self-pay

## 2019-12-07 DIAGNOSIS — I701 Atherosclerosis of renal artery: Secondary | ICD-10-CM | POA: Diagnosis present

## 2019-12-07 DIAGNOSIS — I150 Renovascular hypertension: Secondary | ICD-10-CM | POA: Diagnosis not present

## 2019-12-07 DIAGNOSIS — N270 Small kidney, unilateral: Secondary | ICD-10-CM | POA: Insufficient documentation

## 2019-12-07 DIAGNOSIS — Z886 Allergy status to analgesic agent status: Secondary | ICD-10-CM | POA: Diagnosis not present

## 2019-12-07 DIAGNOSIS — Z7901 Long term (current) use of anticoagulants: Secondary | ICD-10-CM | POA: Diagnosis not present

## 2019-12-07 DIAGNOSIS — I10 Essential (primary) hypertension: Secondary | ICD-10-CM | POA: Diagnosis not present

## 2019-12-07 DIAGNOSIS — Z880 Allergy status to penicillin: Secondary | ICD-10-CM | POA: Diagnosis not present

## 2019-12-07 DIAGNOSIS — I480 Paroxysmal atrial fibrillation: Secondary | ICD-10-CM | POA: Diagnosis not present

## 2019-12-07 DIAGNOSIS — Z8673 Personal history of transient ischemic attack (TIA), and cerebral infarction without residual deficits: Secondary | ICD-10-CM | POA: Diagnosis not present

## 2019-12-07 DIAGNOSIS — Z79899 Other long term (current) drug therapy: Secondary | ICD-10-CM | POA: Diagnosis not present

## 2019-12-07 DIAGNOSIS — Z87891 Personal history of nicotine dependence: Secondary | ICD-10-CM | POA: Diagnosis not present

## 2019-12-07 HISTORY — PX: PERIPHERAL VASCULAR INTERVENTION: CATH118257

## 2019-12-07 HISTORY — PX: RENAL ANGIOGRAPHY: CATH118260

## 2019-12-07 LAB — POCT ACTIVATED CLOTTING TIME: Activated Clotting Time: 268 seconds

## 2019-12-07 SURGERY — RENAL ANGIOGRAPHY
Anesthesia: LOCAL | Laterality: Right

## 2019-12-07 MED ORDER — SODIUM CHLORIDE 0.9% FLUSH: 3.0000 mL | INTRAVENOUS | Status: DC | PRN

## 2019-12-07 MED ORDER — SODIUM CHLORIDE 0.9 % WEIGHT BASED INFUSION
3.0000 mL/kg/h | INTRAVENOUS | Status: AC
  Administered 2019-12-07: 3 mL/kg/h via INTRAVENOUS

## 2019-12-07 MED ORDER — HYDRALAZINE HCL 20 MG/ML IJ SOLN: 5.0000 mg | INTRAMUSCULAR | Status: DC | PRN

## 2019-12-07 MED ORDER — LABETALOL HCL 5 MG/ML IV SOLN: 10.0000 mg | INTRAVENOUS | Status: DC | PRN

## 2019-12-07 MED ORDER — SODIUM CHLORIDE 0.9 % IV SOLN: 250.0000 mL | INTRAVENOUS | Status: DC | PRN

## 2019-12-07 MED ORDER — CLOPIDOGREL BISULFATE 300 MG PO TABS
ORAL_TABLET | ORAL | Status: AC
  Filled 2019-12-07: qty 1

## 2019-12-07 MED ORDER — HEPARIN (PORCINE) IN NACL 1000-0.9 UT/500ML-% IV SOLN
INTRAVENOUS | Status: AC
  Filled 2019-12-07: qty 1000

## 2019-12-07 MED ORDER — CLOPIDOGREL BISULFATE 75 MG PO TABS: 75.0000 mg | ORAL_TABLET | Freq: Every day | ORAL | Status: DC

## 2019-12-07 MED ORDER — SODIUM CHLORIDE 0.9 % IV SOLN: INTRAVENOUS | Status: DC

## 2019-12-07 MED ORDER — FENTANYL CITRATE (PF) 100 MCG/2ML IJ SOLN
INTRAMUSCULAR | Status: AC
  Filled 2019-12-07: qty 2

## 2019-12-07 MED ORDER — MIDAZOLAM HCL 2 MG/2ML IJ SOLN
INTRAMUSCULAR | Status: AC
  Filled 2019-12-07: qty 2

## 2019-12-07 MED ORDER — HEPARIN SODIUM (PORCINE) 1000 UNIT/ML IJ SOLN
INTRAMUSCULAR | Status: DC | PRN
  Administered 2019-12-07: 6000 [IU] via INTRAVENOUS
  Administered 2019-12-07: 2500 [IU] via INTRAVENOUS

## 2019-12-07 MED ORDER — HEPARIN (PORCINE) IN NACL 1000-0.9 UT/500ML-% IV SOLN
INTRAVENOUS | Status: DC | PRN
  Administered 2019-12-07 (×2): 500 mL

## 2019-12-07 MED ORDER — MIDAZOLAM HCL 2 MG/2ML IJ SOLN
INTRAMUSCULAR | Status: DC | PRN
  Administered 2019-12-07: 1 mg via INTRAVENOUS

## 2019-12-07 MED ORDER — LIDOCAINE HCL (PF) 1 % IJ SOLN
INTRAMUSCULAR | Status: AC
  Filled 2019-12-07: qty 30

## 2019-12-07 MED ORDER — HEPARIN SODIUM (PORCINE) 1000 UNIT/ML IJ SOLN
INTRAMUSCULAR | Status: AC
  Filled 2019-12-07: qty 1

## 2019-12-07 MED ORDER — ASPIRIN 81 MG PO CHEW: 81.0000 mg | CHEWABLE_TABLET | ORAL | Status: DC

## 2019-12-07 MED ORDER — CLOPIDOGREL BISULFATE 75 MG PO TABS
75.0000 mg | ORAL_TABLET | Freq: Every day | ORAL | 11 refills | Status: DC
Start: 2019-12-07 — End: 2020-06-08

## 2019-12-07 MED ORDER — ONDANSETRON HCL 4 MG/2ML IJ SOLN: 4.0000 mg | Freq: Four times a day (QID) | INTRAMUSCULAR | Status: DC | PRN

## 2019-12-07 MED ORDER — FENTANYL CITRATE (PF) 100 MCG/2ML IJ SOLN
INTRAMUSCULAR | Status: DC | PRN
  Administered 2019-12-07: 25 ug via INTRAVENOUS

## 2019-12-07 MED ORDER — SODIUM CHLORIDE 0.9 % WEIGHT BASED INFUSION: 1.0000 mL/kg/h | INTRAVENOUS | Status: DC

## 2019-12-07 MED ORDER — LIDOCAINE HCL (PF) 1 % IJ SOLN
INTRAMUSCULAR | Status: DC | PRN
  Administered 2019-12-07: 25 mL via SUBCUTANEOUS

## 2019-12-07 MED ORDER — SODIUM CHLORIDE 0.9% FLUSH: 3.0000 mL | Freq: Two times a day (BID) | INTRAVENOUS | Status: DC

## 2019-12-07 MED ORDER — IODIXANOL 320 MG/ML IV SOLN
INTRAVENOUS | Status: DC | PRN
  Administered 2019-12-07: 95 mL via INTRA_ARTERIAL

## 2019-12-07 MED ORDER — CLOPIDOGREL BISULFATE 300 MG PO TABS
ORAL_TABLET | ORAL | Status: DC | PRN
  Administered 2019-12-07: 300 mg via ORAL

## 2019-12-07 MED ORDER — ACETAMINOPHEN 325 MG PO TABS: 650.0000 mg | ORAL_TABLET | ORAL | Status: DC | PRN

## 2019-12-07 MED ORDER — MORPHINE SULFATE (PF) 2 MG/ML IV SOLN: 2.0000 mg | INTRAVENOUS | Status: DC | PRN

## 2019-12-07 MED ORDER — ASPIRIN EC 81 MG PO TBEC: 81.0000 mg | DELAYED_RELEASE_TABLET | Freq: Every day | ORAL | Status: DC

## 2019-12-07 SURGICAL SUPPLY — 18 items
BALLN VIATRAC 4X20X135 (BALLOONS) ×3
BALLOON VIATRAC 4X20X135 (BALLOONS) IMPLANT
CATH ANGIO 5F PIGTAIL 65CM (CATHETERS) ×1 IMPLANT
CLOSURE MYNX CONTROL 6F/7F (Vascular Products) ×1 IMPLANT
GUIDE CATH VISTA JR4 6F (CATHETERS) ×1 IMPLANT
KIT ENCORE 26 ADVANTAGE (KITS) ×1 IMPLANT
KIT PV (KITS) ×3 IMPLANT
SHEATH PINNACLE 5F 10CM (SHEATH) IMPLANT
SHEATH PINNACLE 6F 10CM (SHEATH) ×1 IMPLANT
SHEATH PROBE COVER 6X72 (BAG) ×1 IMPLANT
STENT HERCULINK RX 5.5X12X135 (Permanent Stent) ×1 IMPLANT
SYR MEDRAD MARK 7 150ML (SYRINGE) ×3 IMPLANT
TRANSDUCER W/STOPCOCK (MISCELLANEOUS) ×3 IMPLANT
TRAY PV CATH (CUSTOM PROCEDURE TRAY) ×3 IMPLANT
TUBING CIL FLEX 10 FLL-RA (TUBING) ×1 IMPLANT
WIRE HI TORQ VERSACORE J 260CM (WIRE) ×1 IMPLANT
WIRE HITORQ VERSACORE ST 145CM (WIRE) ×1 IMPLANT
WIRE STABILIZER XS .014X180CM (WIRE) ×1 IMPLANT

## 2019-12-07 NOTE — Discharge Instructions (Signed)
RESTART ELIQUIS TOMORROW 5/11 TAKE PLAVIX 75 MG TOMORROW 5/11 ASPIRIN 81MG  DAILY FOR 30 DAYS   Angiogram, Care After This sheet gives you information about how to care for yourself after your procedure. Your health care provider may also give you more specific instructions. If you have problems or questions, contact your health care provider. What can I expect after the procedure? After the procedure, it is common to have bruising and tenderness at the catheter insertion area. Follow these instructions at home: Insertion site care  Follow instructions from your health care provider about how to take care of your insertion site. Make sure you: ? Wash your hands with soap and water before you change your bandage (dressing). If soap and water are not available, use hand sanitizer. ? Change your dressing as told by your health care provider. ? Leave stitches (sutures), skin glue, or adhesive strips in place. These skin closures may need to stay in place for 2 weeks or longer. If adhesive strip edges start to loosen and curl up, you may trim the loose edges. Do not remove adhesive strips completely unless your health care provider tells you to do that.  Do not take baths, swim, or use a hot tub until your health care provider approves.  You may shower 24-48 hours after the procedure or as told by your health care provider. ? Gently wash the site with plain soap and water. ? Pat the area dry with a clean towel. ? Do not rub the site. This may cause bleeding.  Do not apply powder or lotion to the site. Keep the site clean and dry.  Check your insertion site every day for signs of infection. Check for: ? Redness, swelling, or pain. ? Fluid or blood. ? Warmth. ? Pus or a bad smell. Activity  Rest as told by your health care provider, usually for 1-2 days.  Do not lift anything that is heavier than 10 lbs. (4.5 kg) or as told by your health care provider.  Do not drive for 24 hours if you  were given a medicine to help you relax (sedative).  Do not drive or use heavy machinery while taking prescription pain medicine. General instructions   Return to your normal activities as told by your health care provider, usually in about a week. Ask your health care provider what activities are safe for you.  If the catheter site starts bleeding, lie flat and put pressure on the site. If the bleeding does not stop, get help right away. This is a medical emergency.  Drink enough fluid to keep your urine clear or pale yellow. This helps flush the contrast dye from your body.  Take over-the-counter and prescription medicines only as told by your health care provider.  Keep all follow-up visits as told by your health care provider. This is important. Contact a health care provider if:  You have a fever or chills.  You have redness, swelling, or pain around your insertion site.  You have fluid or blood coming from your insertion site.  The insertion site feels warm to the touch.  You have pus or a bad smell coming from your insertion site.  You have bruising around the insertion site.  You notice blood collecting in the tissue around the catheter site (hematoma). The hematoma may be painful to the touch. Get help right away if:  You have severe pain at the catheter insertion area.  The catheter insertion area swells very fast.  The catheter  insertion area is bleeding, and the bleeding does not stop when you hold steady pressure on the area.  The area near or just beyond the catheter insertion site becomes pale, cool, tingly, or numb. These symptoms may represent a serious problem that is an emergency. Do not wait to see if the symptoms will go away. Get medical help right away. Call your local emergency services (911 in the [place] Do not drive yourself to the hospital. Summary  After the procedure, it is common to have bruising and tenderness at the catheter insertion  area.  After the procedure, it is important to rest and drink plenty of fluids.  Do not take baths, swim, or use a hot tub until your health care provider says it is okay to do so. You may shower 24-48 hours after the procedure or as told by your health care provider.  If the catheter site starts bleeding, lie flat and put pressure on the site. If the bleeding does not stop, get help right away. This is a medical emergency. This information is not intended to replace advice given to you by your health care provider. Make sure you discuss any questions you have with your health care provider. Document Revised: 06/28/2017 Document Reviewed: 06/20/2016 Elsevier Patient Education  2020 [employer].

## 2019-12-07 NOTE — Interval H&P Note (Signed)
History and Physical Interval Note:  12/07/2019 10:28 AM  Robin Collins  has presented today for surgery, with the diagnosis of Renal artery stenosis.  The various methods of treatment have been discussed with the patient and family. After consideration of risks, benefits and other options for treatment, the patient has consented to  Procedure(s): RENAL ANGIOGRAPHY (N/A) as a surgical intervention.  The patient's history has been reviewed, patient examined, no change in status, stable for surgery.  I have reviewed the patient's chart and labs.  Questions were answered to the patient's satisfaction.     [name]

## 2019-12-08 ENCOUNTER — Telehealth: Payer: Self-pay | Admitting: *Deleted

## 2019-12-08 NOTE — Telephone Encounter (Signed)
Spoke with pt, per dr berry's request. She reports her bp at 7 am this morning was 106/64, she has not taken her amlodipine or chlorthalidone. She reports feeling tired but no other symptoms and her groin site looks good.

## 2019-12-08 NOTE — Telephone Encounter (Signed)
Spoke with pt, her bp currently is 113/73. Patient instructed per dr berry to stop the amlodipine and chlorthalidone for now. She will track her bp twice daily and bring the list to her follow up appointment in 2 weeks. She will send a message if her bp starts to trend high.

## 2019-12-14 ENCOUNTER — Other Ambulatory Visit: Payer: Self-pay

## 2019-12-14 ENCOUNTER — Ambulatory Visit (HOSPITAL_COMMUNITY)
Admission: RE | Admit: 2019-12-14 | Discharge: 2019-12-14 | Disposition: A | Discharge status: Home / Self Care | Payer: BC Managed Care – PPO | Source: Ambulatory Visit | Attending: Cardiovascular Disease | Admitting: Cardiovascular Disease

## 2019-12-14 ENCOUNTER — Other Ambulatory Visit (HOSPITAL_COMMUNITY): Payer: Self-pay | Admitting: Cardiovascular Disease

## 2019-12-14 DIAGNOSIS — I701 Atherosclerosis of renal artery: Secondary | ICD-10-CM | POA: Diagnosis not present

## 2019-12-14 DIAGNOSIS — Z9889 Other specified postprocedural states: Secondary | ICD-10-CM

## 2019-12-15 ENCOUNTER — Ambulatory Visit: Payer: BC Managed Care – PPO | Admitting: Cardiovascular Disease

## 2019-12-15 ENCOUNTER — Telehealth: Payer: Self-pay

## 2019-12-15 DIAGNOSIS — I701 Atherosclerosis of renal artery: Secondary | ICD-10-CM

## 2019-12-15 NOTE — Telephone Encounter (Signed)
Spoke to patient renal doppler results given.Advised to repeat in 6 months.

## 2019-12-22 ENCOUNTER — Encounter: Payer: Self-pay | Admitting: Cardiovascular Disease

## 2019-12-22 ENCOUNTER — Ambulatory Visit: Payer: BC Managed Care – PPO | Admitting: Cardiovascular Disease

## 2019-12-22 ENCOUNTER — Other Ambulatory Visit: Payer: Self-pay

## 2019-12-22 DIAGNOSIS — I701 Atherosclerosis of renal artery: Secondary | ICD-10-CM | POA: Diagnosis not present

## 2019-12-22 NOTE — Progress Notes (Signed)
12/22/2019 Robin Collins   1945/08/16  [id number]  Primary Physician [name], MD Primary Cardiologist: Lorretta Harp MD [name], [place]  HPI:  Robin Collins is a 74 y.o.  married Caucasian female with no children who is retired from being Clinical biochemist of Librarian, academic at [employer]. She was referred by Dr. Marlou Porch for evaluation and possible treatment of renal vascular hypertension.   I saw her last for a virtual telemedicine video visit 11/20/2019.  Her cardiac risk factor profile is notable for treated hypertension on amlodipine and atenolol. She has PAF followed by Dr. Rayann Heman in the past on flecainide and Eliquis. She has had several TIAs as well. She is never had a heart attack or stroke. She denies chest pain or shortness of breath. She had sudden spike in blood pressure in the fall and has been placed on 2 antihypertensive medications with blood pressures at home that normally run in the A999333 range systolic. She had a renal Doppler study performed 09/08/2019 that revealed a slightly smaller right kidney measuring 8.9 cm compared to the left measuring 9.5 cm with a right renal aortic ratio of 4.52 suggesting a physiologically significant lesion with greater than 50%. She does have normal renal function.  I obtain at CTA of her abdomen revealing what appears to be a  significant greater than 75% right renal artery stenosis.  Based on this, her Doppler findings and the fact that her right kidney is somewhat smaller than the left in the setting of hypertension controlled on 3 antihypertensive medications, we decided to proceed with renal angiography potential intervention for treatment of renal vascular hypertension and renal preservation.  I performed angiography on her 12/07/2019 revealing a 95% ostial right renal artery stenosis which I stented successfully.  Her follow-up with renal Dopplers a week later normalized.  She stopped taking  her amlodipine and chlorthalidone and is only on atenolol with blood pressures in the 130/80 range.  She does keep a fairly comprehensive blood pressure log.  She remains on aspirin and Plavix.     Current Meds  Medication Sig  . acetaminophen (TYLENOL) 500 MG tablet Take 500-1,000 mg by mouth every 6 (six) hours as needed for moderate pain or headache.   . ALPRAZolam (XANAX) 0.25 MG tablet Take 0.125 mg by mouth daily as needed for anxiety.   Marland Kitchen atenolol (TENORMIN) 50 MG tablet Take 1 tablet (50 mg total) by mouth 2 (two) times daily.  . B Complex Vitamins (VITAMIN B COMPLEX PO) Take 1 tablet by mouth daily.   . busPIRone (BUSPAR) 10 MG tablet Take 10 mg by mouth 2 (two) times daily.  . Calcium Carb-Cholecalciferol (CALCIUM 600 + D PO) Take 2 tablets by mouth daily.  . clopidogrel (PLAVIX) 75 MG tablet Take 1 tablet (75 mg total) by mouth daily.  Marland Kitchen ELIQUIS 5 MG TABS tablet TAKE 1 TABLET BY MOUTH TWICE DAILY (Patient taking differently: Take 5 mg by mouth 2 (two) times daily. )  . flecainide (TAMBOCOR) 50 MG tablet TAKE 1 TABLET BY MOUTH TWICE DAILY (Patient taking differently: Take 25 mg by mouth 2 (two) times daily. )  . Menthol, Topical Analgesic, (BIOFREEZE EX) Apply 1 application topically daily as needed (back/shoulder pain).  . Multiple Vitamin (MULTIVITAMIN) tablet Take 1 tablet by mouth daily.    . Polyethylene Glycol 400 (BLINK TEARS) 0.25 % SOLN Place 1 drop into both eyes daily as needed (dry eyes).  . pravastatin (PRAVACHOL)  10 MG tablet Take 5 mg by mouth every Monday, Wednesday, and Friday.    Current Facility-Administered Medications for the 12/22/19 encounter (Office Visit) with Lorretta Harp, MD  Medication  . sodium chloride flush (NS) 0.9 % injection 3 mL     Allergies  Allergen Reactions  . Augmentin [Amoxicillin-Pot Clavulanate] Nausea And Vomiting  . Vioxx [Rofecoxib] Palpitations    Social History   Socioeconomic History  . Marital status: Married     Spouse name: Not on file  . Number of children: Not on file  . Years of education: Not on file  . Highest education level: Not on file  Occupational History  . Not on file  Tobacco Use  . Smoking status: Former Smoker    Types: Cigarettes  . Smokeless tobacco: Never Used  . Tobacco comment: social smoker -no use in 30 yrs  Substance and Sexual Activity  . Alcohol use: Yes    Comment: 2-3 times monthly shots of liquor/mixed drinks  . Drug use: No  . Sexual activity: Not on file  Other Topics Concern  . Not on file  Social History Narrative  . Not on file   Social Determinants of Health   Financial Resource Strain:   . Difficulty of Paying Living Expenses:   Food Insecurity:   . Worried About Charity fundraiser in the Last Year:   . Arboriculturist in the Last Year:   Transportation Needs:   . Film/video editor (Medical):   Marland Kitchen Lack of Transportation (Non-Medical):   Physical Activity:   . Days of Exercise per Week:   . Minutes of Exercise per Session:   Stress:   . Feeling of Stress :   Social Connections:   . Frequency of Communication with Friends and Family:   . Frequency of Social Gatherings with Friends and Family:   . Attends Religious Services:   . Active Member of Clubs or Organizations:   . Attends Archivist Meetings:   Marland Kitchen Marital Status:   Intimate Partner Violence:   . Fear of Current or Ex-Partner:   . Emotionally Abused:   Marland Kitchen Physically Abused:   . Sexually Abused:      Review of Systems: General: negative for chills, fever, night sweats or weight changes.  Cardiovascular: negative for chest pain, dyspnea on exertion, edema, orthopnea, palpitations, paroxysmal nocturnal dyspnea or shortness of breath Dermatological: negative for rash Respiratory: negative for cough or wheezing Urologic: negative for hematuria Abdominal: negative for nausea, vomiting, diarrhea, bright red blood per rectum, melena, or hematemesis Neurologic: negative for  visual changes, syncope, or dizziness All other systems reviewed and are otherwise negative except as noted above.    Blood pressure (!) 154/70, pulse (!) 58, height 5\' 3"  (1.6 m), weight 104 lb (47.2 kg), SpO2 99 %.  General appearance: alert and no distress Neck: no adenopathy, no carotid bruit, no JVD, supple, symmetrical, trachea midline and thyroid not enlarged, symmetric, no tenderness/mass/nodules Lungs: clear to auscultation bilaterally Heart: regular rate and rhythm, S1, S2 normal, no murmur, click, rub or gallop Extremities: extremities normal, atraumatic, no cyanosis or edema Pulses: 2+ and symmetric Skin: Skin color, texture, turgor normal. No rashes or lesions Neurologic: Alert and oriented X 3, normal strength and tone. Normal symmetric reflexes. Normal coordination and gait  EKG not performed today  ASSESSMENT AND PLAN:   Renal artery stenosis ([facility]) History of resistant hypertension on 3 antihypertensive medications with renal CTA and Doppler suggesting high-grade  right renal artery stenosis.  I confirmed this by angiography on 12/07/2019 and and placed a stent at the origin of her right renal artery.  Since that time she has stopped her amlodipine and chlorthalidone and is only on a beta-blocker.  I reviewed her blood pressure log which shows blood pressures in the 120s 130/70-80 range.  She feels clinically improved.  She is on aspirin Plavix.  We will get renal Dopplers in 6 months I will see her then in follow-up which time I will discontinue her Plavix.      Lorretta Harp MD FACP,FACC,FAHA, [facility] 12/22/2019 9:32 AM

## 2019-12-22 NOTE — Assessment & Plan Note (Signed)
History of resistant hypertension on 3 antihypertensive medications with renal CTA and Doppler suggesting high-grade right renal artery stenosis.  I confirmed this by angiography on 12/07/2019 and and placed a stent at the origin of her right renal artery.  Since that time she has stopped her amlodipine and chlorthalidone and is only on a beta-blocker.  I reviewed her blood pressure log which shows blood pressures in the 120s 130/70-80 range.  She feels clinically improved.  She is on aspirin Plavix.  We will get renal Dopplers in 6 months I will see her then in follow-up which time I will discontinue her Plavix.

## 2019-12-22 NOTE — Patient Instructions (Signed)
Medication Instructions:  Your physician recommends that you continue on your current medications as directed. Please refer to the Current Medication list given to you today.  *If you need a refill on your cardiac medications before your next appointment, please call your pharmacy*   Testing/Procedures:  IN 6 MONTHS: Your physician has requested that you have a renal artery duplex. During this test, an ultrasound is used to evaluate blood flow to the kidneys. Allow one hour for this exam. Do not eat after midnight the day before and avoid carbonated beverages. Take your medications as you usually do. You should receive a call from [place] when it is time to schedule.   Follow-Up: At [facility], you and your health needs are our priority.  As part of our continuing mission to provide you with exceptional heart care, we have created designated Provider Care Teams.  These Care Teams include your primary Cardiologist (physician) and Advanced Practice Providers (APPs -  Physician Assistants and Nurse Practitioners) who all work together to provide you with the care you need, when you need it.  We recommend signing up for the patient portal called "MyChart".  Sign up information is provided on this After Visit Summary.  MyChart is used to connect with patients for Virtual Visits (Telemedicine).  Patients are able to view lab/test results, encounter notes, upcoming appointments, etc.  Non-urgent messages can be sent to your provider as well.   To learn more about what you can do with MyChart, go to [email or web address].    Your next appointment:   6 month(s)  The format for your next appointment:   In Person  Provider:   You may see [name], MD or one of the following Advanced Practice Providers on your designated Care Team:    Kerin Ransom, PA-C  [place], [place]  Coletta Memos, [place]    Other Instructions Please call our office 2 months in advance to schedule your follow-up  appointment.

## 2020-01-07 DIAGNOSIS — L720 Epidermal cyst: Secondary | ICD-10-CM | POA: Diagnosis not present

## 2020-01-07 DIAGNOSIS — L821 Other seborrheic keratosis: Secondary | ICD-10-CM | POA: Diagnosis not present

## 2020-01-07 DIAGNOSIS — D692 Other nonthrombocytopenic purpura: Secondary | ICD-10-CM | POA: Diagnosis not present

## 2020-02-04 ENCOUNTER — Other Ambulatory Visit: Payer: Self-pay | Admitting: Cardiology

## 2020-02-04 NOTE — Telephone Encounter (Signed)
Prescription refill request for Eliquis received.  Last office visit: 12/22/2019 Scr: 1.20, 12/03/2019 Age: 74 y.o. Weight: 47.2 kg   Prescription refill sent.

## 2020-04-02 DIAGNOSIS — R509 Fever, unspecified: Secondary | ICD-10-CM | POA: Diagnosis not present

## 2020-04-02 DIAGNOSIS — Z1152 Encounter for screening for COVID-19: Secondary | ICD-10-CM | POA: Diagnosis not present

## 2020-04-02 DIAGNOSIS — Z20822 Contact with and (suspected) exposure to covid-19: Secondary | ICD-10-CM | POA: Diagnosis not present

## 2020-04-06 DIAGNOSIS — Z23 Encounter for immunization: Secondary | ICD-10-CM | POA: Diagnosis not present

## 2020-04-06 DIAGNOSIS — R030 Elevated blood-pressure reading, without diagnosis of hypertension: Secondary | ICD-10-CM | POA: Diagnosis not present

## 2020-04-06 DIAGNOSIS — F419 Anxiety disorder, unspecified: Secondary | ICD-10-CM | POA: Diagnosis not present

## 2020-05-26 ENCOUNTER — Other Ambulatory Visit: Payer: Self-pay | Admitting: Internal Medicine

## 2020-05-30 ENCOUNTER — Ambulatory Visit (HOSPITAL_COMMUNITY)
Admission: RE | Admit: 2020-05-30 | Discharge: 2020-05-30 | Disposition: A | Discharge status: Home / Self Care | Payer: BC Managed Care – PPO | Source: Ambulatory Visit | Attending: Cardiology | Admitting: Cardiology

## 2020-05-30 ENCOUNTER — Other Ambulatory Visit: Payer: Self-pay

## 2020-05-30 ENCOUNTER — Other Ambulatory Visit (HOSPITAL_COMMUNITY): Payer: Self-pay | Admitting: Cardiovascular Disease

## 2020-05-30 DIAGNOSIS — Z9889 Other specified postprocedural states: Secondary | ICD-10-CM

## 2020-05-30 DIAGNOSIS — I701 Atherosclerosis of renal artery: Secondary | ICD-10-CM | POA: Diagnosis not present

## 2020-06-08 ENCOUNTER — Ambulatory Visit (INDEPENDENT_AMBULATORY_CARE_PROVIDER_SITE_OTHER): Payer: BC Managed Care – PPO | Admitting: Cardiovascular Disease

## 2020-06-08 ENCOUNTER — Encounter: Payer: Self-pay | Admitting: Cardiovascular Disease

## 2020-06-08 ENCOUNTER — Other Ambulatory Visit: Payer: Self-pay

## 2020-06-08 VITALS — BP 142/80 | HR 68 | Ht 63.0 in | Wt 103.2 lb

## 2020-06-08 DIAGNOSIS — E782 Mixed hyperlipidemia: Secondary | ICD-10-CM | POA: Diagnosis not present

## 2020-06-08 DIAGNOSIS — I701 Atherosclerosis of renal artery: Secondary | ICD-10-CM

## 2020-06-08 DIAGNOSIS — E785 Hyperlipidemia, unspecified: Secondary | ICD-10-CM | POA: Insufficient documentation

## 2020-06-08 DIAGNOSIS — Z79899 Other long term (current) drug therapy: Secondary | ICD-10-CM | POA: Diagnosis not present

## 2020-06-08 MED ORDER — PRAVASTATIN SODIUM 10 MG PO TABS: 10.0000 mg | ORAL_TABLET | Freq: Every day | ORAL | 3 refills | Status: DC

## 2020-06-08 NOTE — Patient Instructions (Addendum)
Medication Instructions:  INCREASE- Pravastatin 10 mg by mouth daily STOP- Plavix  *If you need a refill on your cardiac medications before your next appointment, please call your pharmacy*   Lab Work: Fasting Lipid and Liver 2 Months  If you have labs (blood work) drawn today and your tests are completely normal, you will receive your results only by: Marland Kitchen MyChart Message (if you have MyChart) OR . A paper copy in the mail If you have any lab test that is abnormal or we need to change your treatment, we will call you to review the results.   Testing/Procedures: Your physician has requested that you have a renal artery duplex in 1 Year . During this test, an ultrasound is used to evaluate blood flow to the kidneys. Allow one hour for this exam. Do not eat after midnight the day before and avoid carbonated beverages. Take your medications as you usually do.   Follow-Up: At [facility], you and your health needs are our priority.  As part of our continuing mission to provide you with exceptional heart care, we have created designated Provider Care Teams.  These Care Teams include your primary Cardiologist (physician) and Advanced Practice Providers (APPs -  Physician Assistants and Nurse Practitioners) who all work together to provide you with the care you need, when you need it.  We recommend signing up for the patient portal called "MyChart".  Sign up information is provided on this After Visit Summary.  MyChart is used to connect with patients for Virtual Visits (Telemedicine).  Patients are able to view lab/test results, encounter notes, upcoming appointments, etc.  Non-urgent messages can be sent to your provider as well.   To learn more about what you can do with MyChart, go to [email or web address].    Your next appointment:   1 Year(s)  The format for your next appointment:   In Person  Provider:   You may see [name], MD or one of the following Advanced Practice  Providers on your designated Care Team:    Kerin Ransom, PA-C  [place], [place]  Coletta Memos, [place]

## 2020-06-08 NOTE — Assessment & Plan Note (Signed)
History of hyperlipidemia on pravastatin 5 mg every other day with lipid profile performed 05/27/2020 revealing total cholesterol of 224, LDL of 114 and HDL of 87.  Given the fact that she does have atherosclerotic disease with high-grade right renal artery stenosis I am going to increase her Pravachol from 5 mg every other day to 10 mg daily and will recheck a lipid liver profile in 2 months

## 2020-06-08 NOTE — Progress Notes (Signed)
06/08/2020 Robin Collins   Jul 10, 1946  [id number]  Primary Physician [name], MD Primary Cardiologist: Lorretta Harp MD Robin Collins, [place]  HPI:  Robin Collins is a 74 y.o.  married Caucasian female with no children who is retired from being Clinical biochemist of Librarian, academic at [employer]. She was referred by Dr. Marlou Porch for evaluation and possible treatment of renal vascular hypertension.I saw last saw her in the office 12/22/2019.Marland Kitchen Her cardiac risk factor profile is notable for treated hypertension on amlodipine and atenolol. She has PAF followed by Dr. Rayann Heman in the past on flecainide and Eliquis. She has had several TIAs as well. She is never had a heart attack or stroke. She denies chest pain or shortness of breath. She had sudden spike in blood pressure in the fall and has been placed on 2 antihypertensive medications with blood pressures at home that normally run in the 850-277 range systolic. She had a renal Doppler study performed 09/08/2019 that revealed a slightly smaller right kidney measuring 8.9 cm compared to the left measuring 9.5 cm with a right renal aortic ratio of 4.52 suggesting a physiologically significant lesion with greater than 50%. She does have normal renal function.  I obtain at CTA of her abdomen revealing what appears to beasignificant greater than 75% right renal artery stenosis. Based on this, her Doppler findings and the fact that her right kidney is somewhat smaller than the left in the setting of hypertension controlled on 3 antihypertensive medications, we decided to proceed with renal angiography potential intervention for treatment of renal vascular hypertension and renal preservation.  I performed angiography on her 12/07/2019 revealing a 95% ostial right renal artery stenosis which I stented successfully.  Her follow-up with renal Dopplers a week later normalized.  She stopped taking her amlodipine and  chlorthalidone and is only on atenolol with blood pressures in the 130/80 range.  She does keep a fairly comprehensive blood pressure log.  She remains on aspirin and Plavix.  Since I saw her 6 months ago her blood pressure remains beautifully controlled on no antihypertensive medications.  She is on low-dose atenolol for her heart rate and PAF.  At this point I feel comfortable discontinue Plavix and keeping her on baby aspirin and her Eliquis.  Her renal Dopplers performed 05/30/2020 confirm a widely patent right renal artery stent with a right renal aortic ratio 2.21.  This will be repeated in a year.  Because of her LDL 1 10/29/2016 on Pravachol 5 mg every other day I elected to increase her Pravachol to 10 mg daily hoping to decrease her LDL to somewhere in the 70 range.   Current Meds  Medication Sig  . acetaminophen (TYLENOL) 500 MG tablet Take 500-1,000 mg by mouth every 6 (six) hours as needed for moderate pain or headache.   . ALPRAZolam (XANAX) 0.25 MG tablet Take 0.125 mg by mouth daily as needed for anxiety.   Marland Kitchen aspirin EC 81 MG tablet Take 81 mg by mouth daily. Swallow whole.  Marland Kitchen atenolol (TENORMIN) 50 MG tablet Take 1 tablet (50 mg total) by mouth 2 (two) times daily.  . B Complex Vitamins (VITAMIN B COMPLEX PO) Take 1 tablet by mouth daily.   . busPIRone (BUSPAR) 10 MG tablet Take 10 mg by mouth 2 (two) times daily.  . Calcium Carb-Cholecalciferol (CALCIUM 600 + D PO) Take 2 tablets by mouth daily.  . Cholecalciferol (DIALYVITE VITAMIN D 5000) 125 MCG (5000  UT) capsule Take 5,000 Units by mouth daily.  . clopidogrel (PLAVIX) 75 MG tablet Take 1 tablet (75 mg total) by mouth daily.  Marland Kitchen ELIQUIS 5 MG TABS tablet TAKE 1 TABLET BY MOUTH TWICE DAILY  . flecainide (TAMBOCOR) 50 MG tablet Take 0.5 tablets (25 mg total) by mouth 2 (two) times daily.  . Menthol, Topical Analgesic, (BIOFREEZE EX) Apply 1 application topically daily as needed (back/shoulder pain).  . Multiple Vitamin (MULTIVITAMIN)  tablet Take 1 tablet by mouth daily.    . Polyethylene Glycol 400 (BLINK TEARS) 0.25 % SOLN Place 1 drop into both eyes daily as needed (dry eyes).  . pravastatin (PRAVACHOL) 10 MG tablet Take 1 tablet (10 mg total) by mouth daily.  . [DISCONTINUED] pravastatin (PRAVACHOL) 10 MG tablet Take 5 mg by mouth every Monday, Wednesday, and Friday.    Current Facility-Administered Medications for the 06/08/20 encounter (Office Visit) with Lorretta Harp, MD  Medication  . sodium chloride flush (NS) 0.9 % injection 3 mL     Allergies  Allergen Reactions  . Augmentin [Amoxicillin-Pot Clavulanate] Nausea And Vomiting  . Vioxx [Rofecoxib] Palpitations    Social History   Socioeconomic History  . Marital status: Married    Spouse name: Not on file  . Number of children: Not on file  . Years of education: Not on file  . Highest education level: Not on file  Occupational History  . Not on file  Tobacco Use  . Smoking status: Former Smoker    Types: Cigarettes  . Smokeless tobacco: Never Used  . Tobacco comment: social smoker -no use in 30 yrs  Vaping Use  . Vaping Use: Never used  Substance and Sexual Activity  . Alcohol use: Yes    Comment: 2-3 times monthly shots of liquor/mixed drinks  . Drug use: No  . Sexual activity: Not on file  Other Topics Concern  . Not on file  Social History Narrative  . Not on file   Social Determinants of Health   Financial Resource Strain:   . Difficulty of Paying Living Expenses: Not on file  Food Insecurity:   . Worried About Charity fundraiser in the Last Year: Not on file  . Ran Out of Food in the Last Year: Not on file  Transportation Needs:   . Lack of Transportation (Medical): Not on file  . Lack of Transportation (Non-Medical): Not on file  Physical Activity:   . Days of Exercise per Week: Not on file  . Minutes of Exercise per Session: Not on file  Stress:   . Feeling of Stress : Not on file  Social Connections:   . Frequency of  Communication with Friends and Family: Not on file  . Frequency of Social Gatherings with Friends and Family: Not on file  . Attends Religious Services: Not on file  . Active Member of Clubs or Organizations: Not on file  . Attends Archivist Meetings: Not on file  . Marital Status: Not on file  Intimate Partner Violence:   . Fear of Current or Ex-Partner: Not on file  . Emotionally Abused: Not on file  . Physically Abused: Not on file  . Sexually Abused: Not on file     Review of Systems: General: negative for chills, fever, night sweats or weight changes.  Cardiovascular: negative for chest pain, dyspnea on exertion, edema, orthopnea, palpitations, paroxysmal nocturnal dyspnea or shortness of breath Dermatological: negative for rash Respiratory: negative for cough or wheezing Urologic:  negative for hematuria Abdominal: negative for nausea, vomiting, diarrhea, bright red blood per rectum, melena, or hematemesis Neurologic: negative for visual changes, syncope, or dizziness All other systems reviewed and are otherwise negative except as noted above.    Blood pressure (!) 142/80, pulse 68, height 5\' 3"  (1.6 m), weight 103 lb 3.2 oz (46.8 kg).  General appearance: alert and no distress Neck: no adenopathy, no carotid bruit, no JVD, supple, symmetrical, trachea midline and thyroid not enlarged, symmetric, no tenderness/mass/nodules Lungs: clear to auscultation bilaterally Heart: regular rate and rhythm, S1, S2 normal, no murmur, click, rub or gallop Extremities: extremities normal, atraumatic, no cyanosis or edema Pulses: 2+ and symmetric Skin: Skin color, texture, turgor normal. No rashes or lesions Neurologic: Alert and oriented X 3, normal strength and tone. Normal symmetric reflexes. Normal coordination and gait  EKG sinus rhythm at 68 with nonspecific ST and T wave changes.  I personally reviewed this EKG.  ASSESSMENT AND PLAN:   Hyperlipidemia History of  hyperlipidemia on pravastatin 5 mg every other day with lipid profile performed 05/27/2020 revealing total cholesterol of 224, LDL of 114 and HDL of 87.  Given the fact that she does have atherosclerotic disease with high-grade right renal artery stenosis I am going to increase her Pravachol from 5 mg every other day to 10 mg daily and will recheck a lipid liver profile in 2 months  Renal artery stenosis (HCC) History of renal vessel hypertension with angiographically documented high-grade ostial right renal artery stenosis status post PTA and stenting by myself 12/07/2019 with an excellent result.  She came off all of her blood pressure medicines and has an excellent blood pressure which she measures at home.  This morning was 128/78 at home, higher here at 142/80.  Her most recent renal Doppler studies performed 05/31/2020 revealed a right renal aortic ratio of 2.11 the right renal dimension of 8.4 cm, slightly smaller than prior to intervention.  We will recheck renal Doppler studies in 1 year.      Lorretta Harp MD FACP,FACC,FAHA, [facility] 06/08/2020 9:23 AM

## 2020-06-08 NOTE — Assessment & Plan Note (Signed)
History of renal vessel hypertension with angiographically documented high-grade ostial right renal artery stenosis status post PTA and stenting by myself 12/07/2019 with an excellent result.  She came off all of her blood pressure medicines and has an excellent blood pressure which she measures at home.  This morning was 128/78 at home, higher here at 142/80.  Her most recent renal Doppler studies performed 05/31/2020 revealed a right renal aortic ratio of 2.11 the right renal dimension of 8.4 cm, slightly smaller than prior to intervention.  We will recheck renal Doppler studies in 1 year.

## 2020-06-17 DIAGNOSIS — R030 Elevated blood-pressure reading, without diagnosis of hypertension: Secondary | ICD-10-CM | POA: Diagnosis not present

## 2020-06-17 DIAGNOSIS — M8580 Other specified disorders of bone density and structure, unspecified site: Secondary | ICD-10-CM | POA: Diagnosis not present

## 2020-06-17 DIAGNOSIS — E7800 Pure hypercholesterolemia, unspecified: Secondary | ICD-10-CM | POA: Diagnosis not present

## 2020-06-17 DIAGNOSIS — E042 Nontoxic multinodular goiter: Secondary | ICD-10-CM | POA: Diagnosis not present

## 2020-07-04 DIAGNOSIS — D225 Melanocytic nevi of trunk: Secondary | ICD-10-CM | POA: Diagnosis not present

## 2020-07-04 DIAGNOSIS — Z961 Presence of intraocular lens: Secondary | ICD-10-CM | POA: Diagnosis not present

## 2020-07-04 DIAGNOSIS — D2262 Melanocytic nevi of left upper limb, including shoulder: Secondary | ICD-10-CM | POA: Diagnosis not present

## 2020-07-04 DIAGNOSIS — H26493 Other secondary cataract, bilateral: Secondary | ICD-10-CM | POA: Diagnosis not present

## 2020-07-04 DIAGNOSIS — L570 Actinic keratosis: Secondary | ICD-10-CM | POA: Diagnosis not present

## 2020-07-04 DIAGNOSIS — H04123 Dry eye syndrome of bilateral lacrimal glands: Secondary | ICD-10-CM | POA: Diagnosis not present

## 2020-07-04 DIAGNOSIS — H35363 Drusen (degenerative) of macula, bilateral: Secondary | ICD-10-CM | POA: Diagnosis not present

## 2020-07-04 DIAGNOSIS — D1801 Hemangioma of skin and subcutaneous tissue: Secondary | ICD-10-CM | POA: Diagnosis not present

## 2020-07-04 DIAGNOSIS — Z85828 Personal history of other malignant neoplasm of skin: Secondary | ICD-10-CM | POA: Diagnosis not present

## 2020-07-04 DIAGNOSIS — H5213 Myopia, bilateral: Secondary | ICD-10-CM | POA: Diagnosis not present

## 2020-07-05 ENCOUNTER — Other Ambulatory Visit: Payer: Self-pay | Admitting: Cardiology

## 2020-07-05 NOTE — Telephone Encounter (Signed)
Pt last saw Dr Rayann Heman 09/28/19 video visit Covid-19, last labs 12/03/19 Creat 1.20, age 74, weight 46.8kg, based on specified criteria pt is on appropriate dosage of Eliquis 5mg  BID.  Will refill rx.

## 2020-08-08 DIAGNOSIS — E782 Mixed hyperlipidemia: Secondary | ICD-10-CM | POA: Diagnosis not present

## 2020-08-08 DIAGNOSIS — Z79899 Other long term (current) drug therapy: Secondary | ICD-10-CM | POA: Diagnosis not present

## 2020-08-08 LAB — HEPATIC FUNCTION PANEL
ALT: 19 IU/L (ref 0–32)
AST: 24 IU/L (ref 0–40)
Albumin: 4.6 g/dL (ref 3.7–4.7)
Alkaline Phosphatase: 75 IU/L (ref 44–121)
Bilirubin Total: 0.3 mg/dL (ref 0.0–1.2)
Bilirubin, Direct: 0.11 mg/dL (ref 0.00–0.40)
Total Protein: 7.4 g/dL (ref 6.0–8.5)

## 2020-08-08 LAB — LIPID PANEL
Chol/HDL Ratio: 2.6 ratio (ref 0.0–4.4)
Cholesterol, Total: 240 mg/dL — ABNORMAL HIGH (ref 100–199)
HDL: 91 mg/dL (ref >39)
LDL Chol Calc (NIH): 131 mg/dL — ABNORMAL HIGH (ref 0–99)
Triglycerides: 106 mg/dL (ref 0–149)
VLDL Cholesterol Cal: 18 mg/dL (ref 5–40)

## 2020-08-22 DIAGNOSIS — Z1231 Encounter for screening mammogram for malignant neoplasm of breast: Secondary | ICD-10-CM | POA: Diagnosis not present

## 2020-08-22 DIAGNOSIS — Z13820 Encounter for screening for osteoporosis: Secondary | ICD-10-CM | POA: Diagnosis not present

## 2020-08-22 DIAGNOSIS — Z124 Encounter for screening for malignant neoplasm of cervix: Secondary | ICD-10-CM | POA: Diagnosis not present

## 2020-08-22 DIAGNOSIS — Z681 Body mass index (BMI) 19 or less, adult: Secondary | ICD-10-CM | POA: Diagnosis not present

## 2020-08-22 DIAGNOSIS — Z01419 Encounter for gynecological examination (general) (routine) without abnormal findings: Secondary | ICD-10-CM | POA: Diagnosis not present

## 2020-09-02 ENCOUNTER — Other Ambulatory Visit: Payer: Self-pay

## 2020-09-02 ENCOUNTER — Encounter: Payer: Self-pay | Admitting: Cardiology

## 2020-09-02 ENCOUNTER — Ambulatory Visit: Payer: BC Managed Care – PPO | Admitting: Cardiology

## 2020-09-02 VITALS — BP 158/62 | HR 65 | Ht 63.0 in | Wt 103.4 lb

## 2020-09-02 DIAGNOSIS — E782 Mixed hyperlipidemia: Secondary | ICD-10-CM | POA: Diagnosis not present

## 2020-09-02 DIAGNOSIS — I701 Atherosclerosis of renal artery: Secondary | ICD-10-CM

## 2020-09-02 DIAGNOSIS — Z79899 Other long term (current) drug therapy: Secondary | ICD-10-CM | POA: Diagnosis not present

## 2020-09-02 MED ORDER — ROSUVASTATIN CALCIUM 10 MG PO TABS
10.0000 mg | ORAL_TABLET | Freq: Every day | ORAL | 3 refills | Status: DC
Start: 2020-09-02 — End: 2020-09-02

## 2020-09-02 MED ORDER — ROSUVASTATIN CALCIUM 10 MG PO TABS
10.0000 mg | ORAL_TABLET | Freq: Every day | ORAL | 3 refills | Status: DC
Start: 2020-09-02 — End: 2021-08-07

## 2020-09-02 NOTE — Progress Notes (Signed)
Cardiology Office Note:    Date:  09/02/2020   ID:  Robin Collins, DOB [birth year], MRN [id number]  PCP:  [name], MD  Cardiologist:  Candee Furbish, MD  Electrophysiologist:  Querry Grayer, MD   Referring MD: [name], MD     History of Present Illness:    Robin Collins is a 75 y.o. female here for the follow-up of paroxysmal atrial fibrillation, renal artery stenosis having seen Dr. [name].   Dr. Rayann Heman February 2020  08/18/19 - OB GYN 192 SBP. Called Dr. Harrington Challenger, saw Dr. Dwyane Dee in walk in clinic. 188/88. Gave her amlodipine 5mg . 2 days later saw Dr. Harrington Challenger. Referred to me. Mentioned kidney function. Taking amlodipine. Pule 66 or below.   Sick for 3 day post 2nd Covid Vaccine. Huge swelling of ankles post vaccine. Now improved, some swelling at end of day.   BP at home 135/80, 127/76. Taking it 4 times a day. Moving a lot.   In review, taking Eliquis, no bleeding episodes.  Atenolol, flecainide 25 mg twice a day very low-dose.  CHA2DS2-VASc 4.  Stopped pravastatin because of hair loss previously.  Dr. Tarri Glenn also sees her for diarrhea.  Blood pressure today 158/62.  CTA of the abdomen revealed what appeared to be 75% right renal artery stenosis.  The right kidney looks somewhat smaller than the left.  Renal angiography was performed on 12/07/2019 revealing a 95% ostial right renal which was successfully stented.  She was able to stop taking her amlodipine and chlorthalidone and was only on atenolol blood pressures usually in the 1 30-80 range.  She is on aspirin and Plavix.  Plavix was discontinued and she remains on low-dose aspirin with her Eliquis for atrial fibrillation.  Renal Dopplers on 05/30/2020 confirmed widely patent renal artery stent in the right side.  Past Medical History:  Diagnosis Date  . Aortic regurgitation   . Cancer ([place])    basal cell  . Cataract   . COLONIC POLYPS, ADENOMATOUS 09/22/2007   Qualifier: Diagnosis of  By: Ronnald Ramp RN, CGRN, Sheri     . Costochondritis    occ. flares, last 3-4 weeks ago.  . Depression    mild  . Dysphagia, pharyngoesophageal phase 01/20/2015  . MITRAL VALVE PROLAPSE 09/22/2007   Qualifier: Diagnosis of  By: Ronnald Ramp RN, [place], [place]    . Multiple thyroid nodules    sees Dr. Conley Rolls every 6 months  . Palpitations   . Paroxysmal atrial fibrillation ([facility])    "palpitations"- occ.now-not frequent  . Temporomandibular joint-pain-dysfunction syndrome (TMJ) 11/16/2010    Past Surgical History:  Procedure Laterality Date  . CATARACT EXTRACTION, BILATERAL    . COLONOSCOPY W/ POLYPECTOMY     last 12'15  . COSMETIC SURGERY     "facial"  . DILATION AND CURETTAGE OF UTERUS    . ESOPHAGOGASTRODUODENOSCOPY (EGD) WITH PROPOFOL N/A 01/20/2015   Procedure: ESOPHAGOGASTRODUODENOSCOPY (EGD) WITH PROPOFOL;  Surgeon: Lafayette Dragon, MD;  Location: [facility];  Service: Endoscopy;  Laterality: N/A;  . EXCISIONAL HEMORRHOIDECTOMY    . EYE SURGERY    . PERIPHERAL VASCULAR INTERVENTION Right 12/07/2019   Procedure: PERIPHERAL VASCULAR INTERVENTION;  Surgeon: Lorretta Harp, MD;  Location: [place] [facility];  Service: Cardiovascular;  Laterality: Right;  renal stent  . RENAL ANGIOGRAPHY N/A 12/07/2019   Procedure: RENAL ANGIOGRAPHY;  Surgeon: Lorretta Harp, MD;  Location: [place] [facility];  Service: Cardiovascular;  Laterality: N/A;  . SAVORY DILATION N/A 01/20/2015  Procedure: SAVORY DILATION;  Surgeon: Lafayette Dragon, MD;  Location: [facility];  Service: Endoscopy;  Laterality: N/A;  . TUBAL LIGATION      Current Medications: Current Meds  Medication Sig  . acetaminophen (TYLENOL) 500 MG tablet Take 500-1,000 mg by mouth every 6 (six) hours as needed for moderate pain or headache.   . ALPRAZolam (XANAX) 0.25 MG tablet Take 0.125 mg by mouth daily as needed for anxiety.   Marland Kitchen aspirin EC 81 MG tablet Take 81 mg by mouth daily. Swallow whole.  Marland Kitchen atenolol (TENORMIN) 50 MG tablet Take 1 tablet (50 mg total) by  mouth 2 (two) times daily.  . B Complex Vitamins (VITAMIN B COMPLEX PO) Take 1 tablet by mouth daily.   . busPIRone (BUSPAR) 10 MG tablet Take 10 mg by mouth 2 (two) times daily.  . Calcium Carb-Cholecalciferol (CALCIUM 600 + D PO) Take 2 tablets by mouth daily.  . Cholecalciferol (DIALYVITE VITAMIN D 5000) 125 MCG (5000 UT) capsule Take 5,000 Units by mouth daily.  Marland Kitchen ELIQUIS 5 MG TABS tablet TAKE 1 TABLET BY MOUTH TWICE DAILY  . flecainide (TAMBOCOR) 50 MG tablet Take 0.5 tablets (25 mg total) by mouth 2 (two) times daily.  . Menthol, Topical Analgesic, (BIOFREEZE EX) Apply 1 application topically daily as needed (back/shoulder pain).  . Multiple Vitamin (MULTIVITAMIN) tablet Take 1 tablet by mouth daily.  . Polyethylene Glycol 400 (BLINK TEARS) 0.25 % SOLN Place 1 drop into both eyes daily as needed (dry eyes).  . [DISCONTINUED] pravastatin (PRAVACHOL) 10 MG tablet Take 1 tablet (10 mg total) by mouth daily.  . [DISCONTINUED] rosuvastatin (CRESTOR) 10 MG tablet Take 1 tablet (10 mg total) by mouth daily.   Current Facility-Administered Medications for the 09/02/20 encounter (Office Visit) with Jerline Pain, MD  Medication  . sodium chloride flush (NS) 0.9 % injection 3 mL     Allergies:   Augmentin [amoxicillin-pot clavulanate] and Vioxx [rofecoxib]   Social History   Socioeconomic History  . Marital status: Married    Spouse name: Not on file  . Number of children: Not on file  . Years of education: Not on file  . Highest education level: Not on file  Occupational History  . Not on file  Tobacco Use  . Smoking status: Former Smoker    Types: Cigarettes  . Smokeless tobacco: Never Used  . Tobacco comment: social smoker -no use in 30 yrs  Vaping Use  . Vaping Use: Never used  Substance and Sexual Activity  . Alcohol use: Yes    Comment: 2-3 times monthly shots of liquor/mixed drinks  . Drug use: No  . Sexual activity: Not on file  Other Topics Concern  . Not on file   Social History Narrative  . Not on file   Social Determinants of Health   Financial Resource Strain: Not on file  Food Insecurity: Not on file  Transportation Needs: Not on file  Physical Activity: Not on file  Stress: Not on file  Social Connections: Not on file     Family History: The patient's family history includes Cancer in her brother and father; Diabetes in her brother, brother, mother, sister, and sister; Kidney disease in her father.  ROS:   Please see the history of present illness.     All other systems reviewed and are negative.  EKGs/Labs/Other Studies Reviewed:    The following studies were reviewed today: Prior ETT normal.  ECHO 09/16/19:   1. Left ventricular  ejection fraction, by estimation, is 55 to 60%. The  left ventricle has normal function. The left ventricle has no regional  wall motion abnormalities. Left ventricular diastolic parameters were  normal.  2. Right ventricular systolic function is normal. The right ventricular  size is normal. There is normal pulmonary artery systolic pressure. The  estimated right ventricular systolic pressure is 54.0 mmHg.  3. The mitral valve is normal in structure and function. No evidence of  mitral valve regurgitation. No evidence of mitral stenosis.  4. Tricuspid valve regurgitation is moderate.  5. Mild aortic regurgitation.. The aortic valve is normal in structure  and function. Aortic valve regurgitation is mild. No aortic stenosis is  present.  6. The inferior vena cava is normal in size with greater than 50%  respiratory variability, suggesting right atrial pressure of 3 mmHg  EKG: EKG today 09/04/2019 shows sinus rhythm 62 with no other abnormalities-prior EKG reviewed as well no significant change.  Recent Labs: 12/03/2019: BUN 16; Creatinine, Ser 1.20; Hemoglobin 13.9; Platelets 248; Potassium 4.9; Sodium 139 08/08/2020: ALT 19  Recent Lipid Panel    Component Value Date/Time   CHOL 240 (H)  08/08/2020 0829   TRIG 106 08/08/2020 0829   HDL 91 08/08/2020 0829   CHOLHDL 2.6 08/08/2020 0829   LDLCALC 131 (H) 08/08/2020 0829    Physical Exam:    VS:  BP (!) 158/62   Pulse 65   Ht 5\' 3"  (1.6 m)   Wt 103 lb 6.4 oz (46.9 kg)   SpO2 93%   BMI 18.32 kg/m     Wt Readings from Last 3 Encounters:  09/02/20 103 lb 6.4 oz (46.9 kg)  06/08/20 103 lb 3.2 oz (46.8 kg)  12/22/19 104 lb (47.2 kg)     GEN:  Thin in no acute distress HEENT: Normal NECK: No JVD; No carotid bruits LYMPHATICS: No lymphadenopathy CARDIAC: RRR, no murmurs, rubs, gallops RESPIRATORY:  Clear to auscultation without rales, wheezing or rhonchi  ABDOMEN: Soft, non-tender, non-distended MUSCULOSKELETAL:  No edema; No deformity  SKIN: Warm and dry NEUROLOGIC:  Alert and oriented x 3 PSYCHIATRIC:  Normal affect   ASSESSMENT:    1. Mixed hyperlipidemia   2. Renal artery stenosis ([place])   3. Medication management    PLAN:    In order of problems listed above: Hyperlipidemia -We will go ahead and change her Pravachol statin over to rosuvastatin 10 mg once a day.  I would like her on a higher intensity statin given her right renal artery stenosis and her continued LDL of approximately 130.  We will recheck lipid panel in 3 months with ALT.  Previously she was worried about hair loss potentially with pravastatin.  She discussed this with her dermatologist.  Right renal artery stenosis -Right renal artery stent placed 12/07/2019 Dr. Gwenlyn Found.  Excellent results.  Blood pressure regimen was reduced at the time.  She is slightly elevated today but overall has been running in the 120s to 130s range at home.  Paroxysmal atrial fibrillation -Diagnosed February 15, 2019, 10 hours of A. fib.  Noted palpitations for years.  Very low-dose flecainide.  EKG normal.  Echo normal.  She did have some mild to moderate aortic valve regurgitation.  No recent episodes.  She understands to take an extra flecainide if necessary. --Had  episode 05/12/20 2 hours. Took extra flecainide. Went away.  Excellent.  Chronic anticoagulation/secondary hypercoagulable state -With her CHA2DS2-VASc of 4, continue with Eliquis. -Has seen Dr. Rayann Heman.  No changes  made.  After stopping the Plavix her bruising got better.  She was on Plavix after her right renal artery stent.  History of TIA -Anticoagulation.  No changes made no recurrence.  Mild to moderate aortic insufficiency -We will continue to monitor should be of no clinical consequence at this point. -Doing well.  Reviewed echocardiogram from 2021.  We will likely be able to repeat this next year.   Medication Adjustments/Labs and Tests Ordered: Current medicines are reviewed at length with the patient today.  Concerns regarding medicines are outlined above.  Orders Placed This Encounter  Procedures  . ALT  . Lipid panel   Meds ordered this encounter  Medications  . DISCONTD: rosuvastatin (CRESTOR) 10 MG tablet    Sig: Take 1 tablet (10 mg total) by mouth daily.    Dispense:  90 tablet    Refill:  3  . rosuvastatin (CRESTOR) 10 MG tablet    Sig: Take 1 tablet (10 mg total) by mouth daily.    Dispense:  90 tablet    Refill:  3    Patient Instructions  Medication Instructions:  1. Stop the pravastatin (Pravachol) 10 mg 2 Start rosuvastatin (Crestor) 10 mg, take one tablet by mouth daily *If you need a refill on your cardiac medications before your next appointment, please call your pharmacy*   Lab Work: Lipid and Alt in 3 months If you have labs (blood work) drawn today and your tests are completely normal, you will receive your results only by: Marland Kitchen MyChart Message (if you have MyChart) OR . A paper copy in the mail If you have any lab test that is abnormal or we need to change your treatment, we will call you to review the results.   Testing/Procedures: None   Follow-Up: At [facility], you and your health needs are our priority.  As part of our continuing  mission to provide you with exceptional heart care, we have created designated Provider Care Teams.  These Care Teams include your primary Cardiologist (physician) and Advanced Practice Providers (APPs -  Physician Assistants and Nurse Practitioners) who all work together to provide you with the care you need, when you need it.    Your next appointment:   1 year(s)  The format for your next appointment:   In Person  Provider:   Candee Furbish, MD        Signed, Candee Furbish, MD  09/02/2020 10:10 AM    [place]

## 2020-09-02 NOTE — Patient Instructions (Signed)
Medication Instructions:  1. Stop the pravastatin (Pravachol) 10 mg 2 Start rosuvastatin (Crestor) 10 mg, take one tablet by mouth daily *If you need a refill on your cardiac medications before your next appointment, please call your pharmacy*   Lab Work: Lipid and Alt in 3 months If you have labs (blood work) drawn today and your tests are completely normal, you will receive your results only by: Marland Kitchen MyChart Message (if you have MyChart) OR . A paper copy in the mail If you have any lab test that is abnormal or we need to change your treatment, we will call you to review the results.   Testing/Procedures: None   Follow-Up: At [facility], you and your health needs are our priority.  As part of our continuing mission to provide you with exceptional heart care, we have created designated Provider Care Teams.  These Care Teams include your primary Cardiologist (physician) and Advanced Practice Providers (APPs -  Physician Assistants and Nurse Practitioners) who all work together to provide you with the care you need, when you need it.    Your next appointment:   1 year(s)  The format for your next appointment:   In Person  Provider:   Candee Furbish, MD

## 2020-10-12 ENCOUNTER — Other Ambulatory Visit: Payer: Self-pay | Admitting: Cardiology

## 2020-10-13 DIAGNOSIS — Z0000 Encounter for general adult medical examination without abnormal findings: Secondary | ICD-10-CM | POA: Diagnosis not present

## 2020-10-13 DIAGNOSIS — E042 Nontoxic multinodular goiter: Secondary | ICD-10-CM | POA: Diagnosis not present

## 2020-10-13 DIAGNOSIS — F419 Anxiety disorder, unspecified: Secondary | ICD-10-CM | POA: Diagnosis not present

## 2020-10-31 ENCOUNTER — Other Ambulatory Visit: Payer: BC Managed Care – PPO

## 2020-11-30 ENCOUNTER — Other Ambulatory Visit: Payer: Self-pay

## 2020-11-30 ENCOUNTER — Other Ambulatory Visit: Payer: BC Managed Care – PPO | Admitting: *Deleted

## 2020-11-30 DIAGNOSIS — E782 Mixed hyperlipidemia: Secondary | ICD-10-CM | POA: Diagnosis not present

## 2020-11-30 DIAGNOSIS — Z79899 Other long term (current) drug therapy: Secondary | ICD-10-CM | POA: Diagnosis not present

## 2020-11-30 DIAGNOSIS — I701 Atherosclerosis of renal artery: Secondary | ICD-10-CM | POA: Diagnosis not present

## 2020-11-30 LAB — LIPID PANEL
Chol/HDL Ratio: 2 ratio (ref 0.0–4.4)
Cholesterol, Total: 189 mg/dL (ref 100–199)
HDL: 93 mg/dL (ref >39)
LDL Chol Calc (NIH): 81 mg/dL (ref 0–99)
Triglycerides: 86 mg/dL (ref 0–149)
VLDL Cholesterol Cal: 15 mg/dL (ref 5–40)

## 2020-11-30 LAB — ALT: ALT: 35 IU/L — ABNORMAL HIGH (ref 0–32)

## 2020-12-16 ENCOUNTER — Other Ambulatory Visit: Payer: Self-pay

## 2020-12-16 ENCOUNTER — Other Ambulatory Visit: Payer: Self-pay | Admitting: Surgery

## 2020-12-16 ENCOUNTER — Ambulatory Visit
Admission: RE | Admit: 2020-12-16 | Discharge: 2020-12-16 | Disposition: A | Discharge status: Home / Self Care | Payer: BC Managed Care – PPO | Source: Ambulatory Visit | Attending: Surgery | Admitting: Surgery

## 2020-12-16 DIAGNOSIS — S0093XA Contusion of unspecified part of head, initial encounter: Secondary | ICD-10-CM | POA: Diagnosis not present

## 2020-12-16 DIAGNOSIS — S20219A Contusion of unspecified front wall of thorax, initial encounter: Secondary | ICD-10-CM | POA: Diagnosis not present

## 2021-01-10 ENCOUNTER — Other Ambulatory Visit (HOSPITAL_BASED_OUTPATIENT_CLINIC_OR_DEPARTMENT_OTHER): Payer: Self-pay

## 2021-01-10 ENCOUNTER — Other Ambulatory Visit: Payer: Self-pay

## 2021-01-10 ENCOUNTER — Ambulatory Visit: Payer: BC Managed Care – PPO | Attending: Internal Medicine

## 2021-01-10 DIAGNOSIS — Z23 Encounter for immunization: Secondary | ICD-10-CM

## 2021-01-10 MED ORDER — PFIZER-BIONT COVID-19 VAC-TRIS 30 MCG/0.3ML IM SUSP
INTRAMUSCULAR | 0 refills | Status: DC
  Filled 2021-01-10: qty 0.3, 1d supply, fill #0

## 2021-01-10 NOTE — Progress Notes (Signed)
   Covid-19 Vaccination Clinic  Name:  Robin Collins    MRN: [id number] DOB: 09/23/1945  01/10/2021  [name] was observed post Covid-19 immunization for 15 minutes without incident. She was provided with Vaccine Information Sheet and instruction to access the V-Safe system.   [name] was instructed to call 911 with any severe reactions post vaccine: Difficulty breathing  Swelling of face and throat  A fast heartbeat  A bad rash all over body  Dizziness and weakness   Immunizations Administered     Name Date Dose VIS Date Route   PFIZER Comrnaty(Gray TOP) Covid-19 Vaccine 01/10/2021  9:29 AM 0.3 mL 07/07/2020 Intramuscular   Manufacturer: [place]   Lot: [id number]   [place] Line: [phone]

## 2021-03-07 ENCOUNTER — Other Ambulatory Visit: Payer: Self-pay | Admitting: Internal Medicine

## 2021-03-07 NOTE — Telephone Encounter (Signed)
Prescription refill request for Eliquis received. Indication: Afib Last office visit: 09/02/20 Marl[place] Porch)  Scr: 1.240 (10/13/20) Age: 75 Weight: 46.9kg  Appropriate dose and refill sent to requested pharmacy.

## 2021-04-06 DIAGNOSIS — Z23 Encounter for immunization: Secondary | ICD-10-CM | POA: Diagnosis not present

## 2021-04-06 DIAGNOSIS — R030 Elevated blood-pressure reading, without diagnosis of hypertension: Secondary | ICD-10-CM | POA: Diagnosis not present

## 2021-04-06 DIAGNOSIS — F419 Anxiety disorder, unspecified: Secondary | ICD-10-CM | POA: Diagnosis not present

## 2021-04-06 DIAGNOSIS — Z681 Body mass index (BMI) 19 or less, adult: Secondary | ICD-10-CM | POA: Diagnosis not present

## 2021-05-16 ENCOUNTER — Telehealth: Payer: Self-pay | Admitting: Cardiology

## 2021-05-16 NOTE — Telephone Encounter (Signed)
   Patient Name: Robin Collins  DOB: 25-Nov-1945 MRN: [id number]  Primary Cardiologist: Candee Furbish, MD  Chart reviewed as part of pre-operative protocol coverage.   Simple dental extractions are considered low risk procedures per guidelines and generally do not require any specific cardiac clearance. It is also generally accepted that for simple extractions and dental cleanings, there is no need to interrupt blood thinner therapy.  SBE prophylaxis is not required for the patient from a cardiac standpoint.  I will route this recommendation to the requesting party via Epic fax function and remove from pre-op pool.  Please call with questions.  [place], [place] 05/16/2021, 11:12 AM

## 2021-05-16 NOTE — Telephone Encounter (Signed)
   [facility] Pre-operative Risk Assessment    Patient Name: Robin Collins  DOB: 1946-06-25 MRN: [id number]  HEARTCARE STAFF:  - IMPORTANT!!!!!! Under Visit Info/Reason for Call, type in Other and utilize the format Clearance MM/DD/YY or Clearance TBD. Do not use dashes or single digits. - Please review there is not already an duplicate clearance open for this procedure. - If request is for dental extraction, please clarify the # of teeth to be extracted. - If the patient is currently at the dentist's office, call Pre-Op Callback Staff (MA/nurse) to input urgent request.  - If the patient is not currently in the dentist office, please route to the Pre-Op pool.  Request for surgical clearance:  What type of surgery is being performed? 1 tooth extraction  When is this surgery scheduled? 05/22/2021  What type of clearance is required (medical clearance vs. Pharmacy clearance to hold med vs. Both)? Both  Are there any medications that need to be held prior to surgery and how long? Need guidance on eliquis  Practice name and name of physician performing surgery? [facility], Dr. Chevis Pretty  What is the office phone number? [phone]   7.   What is the office fax number? [phone]  8.   Anesthesia type (None, local, MAC, general) ? None    Robin Collins 05/16/2021, 8:46 AM  _________________________________________________________________   (provider comments below)

## 2021-05-18 NOTE — Telephone Encounter (Signed)
[facility] Called stating they still have not received clearance.  It can be faxed to [phone].

## 2021-05-19 NOTE — Telephone Encounter (Signed)
[facility] was calling again regarding the clearance. They are concerned that this may end up being a more surgical type of extraction and they just want to be prepared.  Please contact them to give updates

## 2021-05-19 NOTE — Telephone Encounter (Signed)
Patient with diagnosis of afib on Eliquis for anticoagulation.    Procedure: 1 tooth surgical extraction Date of procedure: 05/22/21  CHA2DS2-VASc Score = 6   This indicates a 9.7% annual risk of stroke. The patient's score is based upon: CHF History: 0 HTN History: 1 Diabetes History: 0 Stroke History: 2 Vascular Disease History: 0 Age Score: 2 Gender Score: 1    CrCl 32.9 ml/min  Patient does NOT require pre-op antibiotics for dental procedure.  Per office protocol, patient can hold Eliquis for 1 day prior to procedure.

## 2021-05-19 NOTE — Telephone Encounter (Signed)
   Primary Cardiologist: Candee Furbish, MD  Chart reviewed as part of pre-operative protocol coverage. Given past medical history and time since last visit, based on ACC/AHA guidelines, Robin Collins would be at acceptable risk for the planned procedure without further cardiovascular testing.   Patient with diagnosis of afib on Eliquis for anticoagulation.     Procedure: 1 tooth surgical extraction Date of procedure: 05/22/21   CHA2DS2-VASc Score = 6   This indicates a 9.7% annual risk of stroke. The patient's score is based upon: CHF History: 0 HTN History: 1 Diabetes History: 0 Stroke History: 2 Vascular Disease History: 0 Age Score: 2 Gender Score: 1    CrCl 32.9 ml/min   Patient does NOT require pre-op antibiotics for dental procedure.   Per office protocol, patient can hold Eliquis for 1 day prior to procedure.  I will route this recommendation to the requesting party via Epic fax function and remove from pre-op pool.  Please call with questions.  Robin Ng. Leanor Voris NP-C    05/19/2021, 1:38 PM [address] Office [phone] Fax [phone]

## 2021-05-31 ENCOUNTER — Ambulatory Visit (HOSPITAL_COMMUNITY)
Admission: RE | Admit: 2021-05-31 | Discharge: 2021-05-31 | Disposition: A | Discharge status: Home / Self Care | Payer: BC Managed Care – PPO | Source: Ambulatory Visit | Attending: Cardiovascular Disease | Admitting: Cardiovascular Disease

## 2021-05-31 ENCOUNTER — Other Ambulatory Visit: Payer: Self-pay

## 2021-05-31 ENCOUNTER — Other Ambulatory Visit (HOSPITAL_COMMUNITY): Payer: Self-pay | Admitting: Cardiovascular Disease

## 2021-05-31 DIAGNOSIS — I701 Atherosclerosis of renal artery: Secondary | ICD-10-CM | POA: Diagnosis not present

## 2021-05-31 DIAGNOSIS — Z9889 Other specified postprocedural states: Secondary | ICD-10-CM

## 2021-06-06 ENCOUNTER — Ambulatory Visit: Payer: BC Managed Care – PPO | Admitting: Cardiovascular Disease

## 2021-06-06 ENCOUNTER — Other Ambulatory Visit: Payer: Self-pay

## 2021-06-06 ENCOUNTER — Encounter: Payer: Self-pay | Admitting: Cardiovascular Disease

## 2021-06-06 VITALS — BP 140/70 | HR 56 | Ht 63.0 in | Wt 102.0 lb

## 2021-06-06 DIAGNOSIS — E782 Mixed hyperlipidemia: Secondary | ICD-10-CM | POA: Diagnosis not present

## 2021-06-06 DIAGNOSIS — I701 Atherosclerosis of renal artery: Secondary | ICD-10-CM | POA: Diagnosis not present

## 2021-06-06 DIAGNOSIS — I10 Essential (primary) hypertension: Secondary | ICD-10-CM

## 2021-06-06 NOTE — Assessment & Plan Note (Signed)
Robin Collins returns today for follow-up of renal vascular hypertension.  She had right renal artery stenting by myself 12/07/2019 after renal Doppler study suggested a significant right renal artery stenosis on 09/08/2019 and subsequent CTA confirmed this.  After this she came off of her antihypertensive medications including amlodipine and chlorthalidone.  She was maintained on atenolol for PAF.  Her Plavix was also discontinued.  Her blood pressures at home running in the 120 over 70s range.  Her Doppler study performed 05/31/2021 suggested continued patency of her right renal artery stent which will be checked on an annual basis.  Her Plavix was discontinued.

## 2021-06-06 NOTE — Patient Instructions (Signed)
Medication Instructions:  Your physician recommends that you continue on your current medications as directed. Please refer to the Current Medication list given to you today.  *If you need a refill on your cardiac medications before your next appointment, please call your pharmacy*   Testing/Procedures: Your physician has requested that you have a renal artery duplex. During this test, an ultrasound is used to evaluate blood flow to the kidneys. Allow one hour for this exam. Do not eat after midnight the day before and avoid carbonated beverages. Take your medications as you usually do. To be done in November 2023. This procedure is done at [facility].   Follow-Up: At [facility], you and your health needs are our priority.  As part of our continuing mission to provide you with exceptional heart care, we have created designated Provider Care Teams.  These Care Teams include your primary Cardiologist (physician) and Advanced Practice Providers (APPs -  Physician Assistants and Nurse Practitioners) who all work together to provide you with the care you need, when you need it.  We recommend signing up for the patient portal called "MyChart".  Sign up information is provided on this After Visit Summary.  MyChart is used to connect with patients for Virtual Visits (Telemedicine).  Patients are able to view lab/test results, encounter notes, upcoming appointments, etc.  Non-urgent messages can be sent to your provider as well.   To learn more about what you can do with MyChart, go to [email or web address].    Your next appointment:   We will see you on an as needed basis.  Provider:   [name], MD

## 2021-06-06 NOTE — Assessment & Plan Note (Signed)
History of hyperlipidemia on rosuvastatin with recent lipid profile performed 05/19/2021 revealing a total cholesterol of 191, LDL 79 and HDL of 95.

## 2021-06-06 NOTE — Progress Notes (Signed)
06/06/2021 [name]   03/01/1946  [id number]  Primary Physician Robin Cruel, Collins Primary Cardiologist: Lorretta Harp Collins [name], [place]  HPI:  Robin Collins is a 75 y.o.  married Caucasian female with no children who is retired from being Clinical biochemist of Librarian, academic at [employer].  She was referred by Dr. Marlou Collins for evaluation and possible treatment of renal vascular hypertension.    I saw last saw her in the office 06/08/2020.Marland Kitchen  Her cardiac risk factor profile is notable for treated hypertension on amlodipine and atenolol.  She has PAF followed by Dr. Rayann Collins in the past on flecainide and Eliquis.  She has had several TIAs as well.  She is never had a heart attack or stroke.  She denies chest pain or shortness of breath.  She had sudden spike in blood pressure in the fall and has been placed on 2 antihypertensive medications with blood pressures at home that normally run in the 409-811 range systolic.  She had a renal Doppler study performed 09/08/2019 that revealed a slightly smaller right kidney measuring 8.9 cm compared to the left measuring 9.5 cm with a right renal aortic ratio of 4.52 suggesting a physiologically significant lesion with greater than 50%.  She does have normal renal function.   I obtain at CTA of her abdomen revealing what appears to be a  significant greater than 75% right renal artery stenosis.  Based on this, her Doppler findings and the fact that her right kidney is somewhat smaller than the left in the setting of hypertension controlled on 3 antihypertensive medications, we decided to proceed with renal angiography potential intervention for treatment of renal vascular hypertension and renal preservation.   I performed angiography on her 12/07/2019 revealing a 95% ostial right renal artery stenosis which I stented successfully.  Her follow-up with renal Dopplers a week later normalized.  She stopped taking her amlodipine and  chlorthalidone and is only on atenolol with blood pressures in the 130/80 range.  She does keep a fairly comprehensive blood pressure log.  She remains on aspirin and Plavix.   Since I saw her in the office a year ago she continues to do well.  Blood pressures have been well controlled at home in the 120/70 range only on atenolol.  Her most recent renal Doppler studies performed 05/31/2021 suggested continued patency of her right renal artery stent.   Current Meds  Medication Sig   acetaminophen (TYLENOL) 500 MG tablet Take 500-1,000 mg by mouth every 6 (six) hours as needed for moderate pain or headache.    ALPRAZolam (XANAX) 0.25 MG tablet Take 0.125 mg by mouth daily as needed for anxiety.    aspirin EC 81 MG tablet Take 81 mg by mouth daily. Swallow whole.   atenolol (TENORMIN) 50 MG tablet TAKE 1 TABLET TWICE A DAY   B Complex Vitamins (VITAMIN B COMPLEX PO) Take 1 tablet by mouth daily.    busPIRone (BUSPAR) 10 MG tablet Take 10 mg by mouth 2 (two) times daily.   ELIQUIS 5 MG TABS tablet TAKE 1 TABLET BY MOUTH TWICE DAILY   flecainide (TAMBOCOR) 50 MG tablet Take 0.5 tablets (25 mg total) by mouth 2 (two) times daily.   Menthol, Topical Analgesic, (BIOFREEZE EX) Apply 1 application topically daily as needed (back/shoulder pain).   Multiple Vitamin (MULTIVITAMIN) tablet Take 1 tablet by mouth daily.   Polyethylene Glycol 400 (BLINK TEARS) 0.25 % SOLN Place 1  drop into both eyes daily as needed (dry eyes).   rosuvastatin (CRESTOR) 10 MG tablet Take 1 tablet (10 mg total) by mouth daily.   Current Facility-Administered Medications for the 06/06/21 encounter (Office Visit) with Lorretta Harp, Collins  Medication   sodium chloride flush (NS) 0.9 % injection 3 mL     Allergies  Allergen Reactions   Augmentin [Amoxicillin-Pot Clavulanate] Nausea And Vomiting   Vioxx [Rofecoxib] Palpitations    Social History   Socioeconomic History   Marital status: Married    Spouse name: Not on file    Number of children: Not on file   Years of education: Not on file   Highest education level: Not on file  Occupational History   Not on file  Tobacco Use   Smoking status: Former    Types: Cigarettes   Smokeless tobacco: Never   Tobacco comments:    social smoker -no use in 30 yrs  Vaping Use   Vaping Use: Never used  Substance and Sexual Activity   Alcohol use: Yes    Comment: 2-3 times monthly shots of liquor/mixed drinks   Drug use: No   Sexual activity: Not on file  Other Topics Concern   Not on file  Social History Narrative   Not on file   Social Determinants of Health   Financial Resource Strain: Not on file  Food Insecurity: Not on file  Transportation Needs: Not on file  Physical Activity: Not on file  Stress: Not on file  Social Connections: Not on file  Intimate Partner Violence: Not on file     Review of Systems: General: negative for chills, fever, night sweats or weight changes.  Cardiovascular: negative for chest pain, dyspnea on exertion, edema, orthopnea, palpitations, paroxysmal nocturnal dyspnea or shortness of breath Dermatological: negative for rash Respiratory: negative for cough or wheezing Urologic: negative for hematuria Abdominal: negative for nausea, vomiting, diarrhea, bright red blood per rectum, melena, or hematemesis Neurologic: negative for visual changes, syncope, or dizziness All other systems reviewed and are otherwise negative except as noted above.    Blood pressure 140/70, pulse (!) 56, height 5\' 3"  (1.6 m), weight 102 lb (46.3 kg), SpO2 97 %.  General appearance: alert and no distress Neck: no adenopathy, no carotid bruit, no JVD, supple, symmetrical, trachea midline, and thyroid not enlarged, symmetric, no tenderness/mass/nodules Lungs: clear to auscultation bilaterally Heart: regular rate and rhythm, S1, S2 normal, no murmur, click, rub or gallop Extremities: extremities normal, atraumatic, no cyanosis or edema Pulses: 2+  and symmetric Skin: Skin color, texture, turgor normal. No rashes or lesions Neurologic: Grossly normal  EKG sinus bradycardia 56 without ST or T wave changes.  I personally reviewed this EKG.  ASSESSMENT AND PLAN:   Renal artery stenosis ([place]) Ms. Dunsmore returns today for follow-up of renal vascular hypertension.  She had right renal artery stenting by myself 12/07/2019 after renal Doppler study suggested a significant right renal artery stenosis on 09/08/2019 and subsequent CTA confirmed this.  After this she came off of her antihypertensive medications including amlodipine and chlorthalidone.  She was maintained on atenolol for PAF.  Her Plavix was also discontinued.  Her blood pressures at home running in the 120 over 70s range.  Her Doppler study performed 05/31/2021 suggested continued patency of her right renal artery stent which will be checked on an annual basis.  Her Plavix was discontinued.  Hyperlipidemia History of hyperlipidemia on rosuvastatin with recent lipid profile performed 05/19/2021 revealing a total cholesterol  of 191, LDL 79 and HDL of 95.      Lorretta Harp Collins FACP,FACC,FAHA, [facility] 06/06/2021 8:41 AM

## 2021-06-14 ENCOUNTER — Other Ambulatory Visit: Payer: Self-pay | Admitting: Internal Medicine

## 2021-07-11 DIAGNOSIS — Z85828 Personal history of other malignant neoplasm of skin: Secondary | ICD-10-CM | POA: Diagnosis not present

## 2021-07-11 DIAGNOSIS — D225 Melanocytic nevi of trunk: Secondary | ICD-10-CM | POA: Diagnosis not present

## 2021-07-11 DIAGNOSIS — L570 Actinic keratosis: Secondary | ICD-10-CM | POA: Diagnosis not present

## 2021-07-11 DIAGNOSIS — D1801 Hemangioma of skin and subcutaneous tissue: Secondary | ICD-10-CM | POA: Diagnosis not present

## 2021-07-11 DIAGNOSIS — D2262 Melanocytic nevi of left upper limb, including shoulder: Secondary | ICD-10-CM | POA: Diagnosis not present

## 2021-07-25 DIAGNOSIS — H26493 Other secondary cataract, bilateral: Secondary | ICD-10-CM | POA: Diagnosis not present

## 2021-07-25 DIAGNOSIS — H04123 Dry eye syndrome of bilateral lacrimal glands: Secondary | ICD-10-CM | POA: Diagnosis not present

## 2021-07-25 DIAGNOSIS — H35363 Drusen (degenerative) of macula, bilateral: Secondary | ICD-10-CM | POA: Diagnosis not present

## 2021-07-25 DIAGNOSIS — H04222 Epiphora due to insufficient drainage, left lacrimal gland: Secondary | ICD-10-CM | POA: Diagnosis not present

## 2021-08-07 ENCOUNTER — Other Ambulatory Visit: Payer: Self-pay | Admitting: Cardiology

## 2021-08-16 ENCOUNTER — Other Ambulatory Visit: Payer: Self-pay | Admitting: Internal Medicine

## 2021-08-16 ENCOUNTER — Other Ambulatory Visit: Payer: Self-pay | Admitting: Cardiology

## 2021-08-16 ENCOUNTER — Encounter: Payer: Self-pay | Admitting: Cardiology

## 2021-08-16 MED ORDER — FLECAINIDE ACETATE 50 MG PO TABS: 25.0000 mg | ORAL_TABLET | Freq: Two times a day (BID) | ORAL | 0 refills | Status: DC

## 2021-08-16 MED ORDER — ROSUVASTATIN CALCIUM 10 MG PO TABS: 10.0000 mg | ORAL_TABLET | Freq: Every day | ORAL | 0 refills | Status: DC

## 2021-08-17 ENCOUNTER — Other Ambulatory Visit: Payer: Self-pay

## 2021-08-17 ENCOUNTER — Ambulatory Visit: Payer: BC Managed Care – PPO | Admitting: Student

## 2021-08-17 ENCOUNTER — Encounter: Payer: Self-pay | Admitting: Student

## 2021-08-17 VITALS — BP 170/60 | HR 55 | Ht 63.0 in | Wt 102.0 lb

## 2021-08-17 DIAGNOSIS — I150 Renovascular hypertension: Secondary | ICD-10-CM

## 2021-08-17 DIAGNOSIS — I10 Essential (primary) hypertension: Secondary | ICD-10-CM | POA: Diagnosis not present

## 2021-08-17 DIAGNOSIS — I480 Paroxysmal atrial fibrillation: Secondary | ICD-10-CM

## 2021-08-17 NOTE — Patient Instructions (Signed)
Medication Instructions:  Your physician recommends that you continue on your current medications as directed. Please refer to the Current Medication list given to you today.  *If you need a refill on your cardiac medications before your next appointment, please call your pharmacy*   Lab Work: None If you have labs (blood work) drawn today and your tests are completely normal, you will receive your results only by: [place] (if you have MyChart) OR A paper copy in the mail If you have any lab test that is abnormal or we need to change your treatment, we will call you to review the results.   Follow-Up: At [facility], you and your health needs are our priority.  As part of our continuing mission to provide you with exceptional heart care, we have created designated Provider Care Teams.  These Care Teams include your primary Cardiologist (physician) and Advanced Practice Providers (APPs -  Physician Assistants and Nurse Practitioners) who all work together to provide you with the care you need, when you need it.   Your next appointment:   7 month(s) - August  The format for your next appointment:   In Person  Provider:   You may see Schirtzinger Grayer, MD or one of the following Advanced Practice Providers on your designated Care Team:   Tommye Standard, [place] Legrand Como "[facility]" [place], [place]

## 2021-08-17 NOTE — Progress Notes (Signed)
PCP:  Robin Cruel, MD Primary Cardiologist: Robin Furbish, MD Electrophysiologist: Robin Grayer, MD   Robin Collins is a 76 y.o. female seen today for Robin Grayer, MD for routine electrophysiology followup.  Since last being seen in our clinic the patient reports doing very well. Last PAF breakthrough was in December. 2 that month, and 2 in October, otherwise has been quiescent. All her breakthrough episodes are associated with high stress. They end easily with an extra half of flecainide.  she denies chest pain, palpitations, dyspnea, PND, orthopnea, nausea, vomiting, dizziness, syncope, edema, weight gain, or early satiety.  Past Medical History:  Diagnosis Date   Aortic regurgitation    Cancer ([place])    basal cell   Cataract    COLONIC POLYPS, ADENOMATOUS 09/22/2007   Qualifier: Diagnosis of  By: Robin Ramp RN, CGRN, Robin Collins     Costochondritis    occ. flares, last 3-4 weeks ago.   Depression    mild   Dysphagia, pharyngoesophageal phase 01/20/2015   MITRAL VALVE PROLAPSE 09/22/2007   Qualifier: Diagnosis of  By: Robin Ramp RN, CGRN, Robin Collins     Multiple thyroid nodules    sees Dr. Conley Collins every 6 months   Palpitations    Paroxysmal atrial fibrillation ([place])    "palpitations"- occ.now-not frequent   Temporomandibular joint-pain-dysfunction syndrome (TMJ) 11/16/2010   Past Surgical History:  Procedure Laterality Date   CATARACT EXTRACTION, BILATERAL     COLONOSCOPY W/ POLYPECTOMY     last 12'15   COSMETIC SURGERY     "facial"   DILATION AND CURETTAGE OF UTERUS     ESOPHAGOGASTRODUODENOSCOPY (EGD) WITH PROPOFOL N/A 01/20/2015   Procedure: ESOPHAGOGASTRODUODENOSCOPY (EGD) WITH PROPOFOL;  Surgeon: Robin Dragon, MD;  Location: [facility];  Service: Endoscopy;  Laterality: N/A;   EXCISIONAL HEMORRHOIDECTOMY     EYE SURGERY     PERIPHERAL VASCULAR INTERVENTION Right 12/07/2019   Procedure: PERIPHERAL VASCULAR INTERVENTION;  Surgeon: Robin Harp, MD;  Location: [place] [facility];  Service: Cardiovascular;  Laterality: Right;  renal stent   RENAL ANGIOGRAPHY N/A 12/07/2019   Procedure: RENAL ANGIOGRAPHY;  Surgeon: Robin Harp, MD;  Location: [place] [facility];  Service: Cardiovascular;  Laterality: N/A;   SAVORY DILATION N/A 01/20/2015   Procedure: SAVORY DILATION;  Surgeon: Robin Dragon, MD;  Location: [facility];  Service: Endoscopy;  Laterality: N/A;   TUBAL LIGATION      Current Outpatient Medications  Medication Sig Dispense Refill   acetaminophen (TYLENOL) 500 MG tablet Take 500-1,000 mg by mouth every 6 (six) hours as needed for moderate pain or headache.      ALPRAZolam (XANAX) 0.25 MG tablet Take 0.125 mg by mouth daily as needed for anxiety.      aspirin EC 81 MG tablet Take 81 mg by mouth daily. Swallow whole.     atenolol (TENORMIN) 50 MG tablet TAKE 1 TABLET TWICE A DAY 180 tablet 3   B Complex Vitamins (VITAMIN B COMPLEX PO) Take 1 tablet by mouth daily.      busPIRone (BUSPAR) 10 MG tablet Take 10 mg by mouth 2 (two) times daily.     Calcium Carb-Cholecalciferol (CALCIUM 600 + D PO) Take 2 tablets by mouth daily.     Cholecalciferol (DIALYVITE VITAMIN D 5000) 125 MCG (5000 UT) capsule Take 5,000 Units by mouth daily.     ELIQUIS 5 MG TABS tablet TAKE 1 TABLET BY MOUTH TWICE DAILY 180 tablet 3   flecainide (TAMBOCOR) 50 MG  tablet Take 0.5 tablets (25 mg total) by mouth 2 (two) times daily. 90 tablet 0   Menthol, Topical Analgesic, (BIOFREEZE EX) Apply 1 application topically daily as needed (back/shoulder pain).     Multiple Vitamin (MULTIVITAMIN) tablet Take 1 tablet by mouth daily.     Polyethylene Glycol 400 (BLINK TEARS) 0.25 % SOLN Place 1 drop into both eyes daily as needed (dry eyes).     rosuvastatin (CRESTOR) 10 MG tablet Take 1 tablet (10 mg total) by mouth daily. 90 tablet 0   Current Facility-Administered Medications  Medication Dose Route Frequency Provider Last Rate Last Admin   sodium chloride flush (NS) 0.9 % injection 3 mL   3 mL Intravenous Q12H Robin Harp, MD        Allergies  Allergen Reactions   Augmentin [Amoxicillin-Pot Clavulanate] Nausea And Vomiting   Vioxx [Rofecoxib] Palpitations    Social History   Socioeconomic History   Marital status: Married    Spouse name: Not on file   Number of children: Not on file   Years of education: Not on file   Highest education level: Not on file  Occupational History   Not on file  Tobacco Use   Smoking status: Former    Types: Cigarettes   Smokeless tobacco: Never   Tobacco comments:    social smoker -no use in 30 yrs  Vaping Use   Vaping Use: Never used  Substance and Sexual Activity   Alcohol use: Yes    Comment: 2-3 times monthly shots of liquor/mixed drinks   Drug use: No   Sexual activity: Not on file  Other Topics Concern   Not on file  Social History Narrative   Not on file   Social Determinants of Health   Financial Resource Strain: Not on file  Food Insecurity: Not on file  Transportation Needs: Not on file  Physical Activity: Not on file  Stress: Not on file  Social Connections: Not on file  Intimate Partner Violence: Not on file     Review of Systems: All other systems reviewed and are otherwise negative except as noted above.  Physical Exam: Vitals:   08/17/21 1059  BP: (!) 170/60  Pulse: (!) 55  SpO2: 99%  Weight: 102 lb (46.3 kg)  Height: 5\' 3"  (1.6 m)    GEN- The patient is well appearing, alert and oriented x 3 today.   HEENT: normocephalic, atraumatic; sclera clear, conjunctiva pink; hearing intact; oropharynx clear; neck supple, no JVP Lymph- no cervical lymphadenopathy Lungs- Clear to ausculation bilaterally, normal work of breathing.  No wheezes, rales, rhonchi Heart- Regular rate and rhythm, no murmurs, rubs or gallops, PMI not laterally displaced GI- soft, non-tender, non-distended, bowel sounds present, no hepatosplenomegaly Extremities- no clubbing, cyanosis, or edema; DP/PT/radial pulses 2+  bilaterally MS- no significant deformity or atrophy Skin- warm and dry, no rash or lesion Psych- euthymic mood, full affect Neuro- strength and sensation are intact  EKG is not ordered. Personal review of EKG from  05/2021  shows sinus brady at 56 with stable intervals  Additional studies reviewed include: Previous EP office notes.   Assessment and Plan:  1.  Paroxysmal atrial fibrillation Well controlled with flecainide chads2vasc score is 4.  She is on eliquis   2. Moderate AI Followed by Dr Marlou Porch, sees next month.    3. Hypertensive cardiovascular disease Stable at home. Elevated today as she arrived early and was not checked in despite attempts to speak with staff.  No  change required today Dr Kennon Holter note 05/2021 reviewed  Follow up with MD/EP-APP in  August 2023 to get on every 6 month alternating visits with gen cards/EP. She has no preference in alternative to Dr. Rayann Heman. Likely would be with Dr. Quentin Ore or [facility] in the event she requires ablation in the future.     Shirley Friar, PA-C  08/17/21 11:21 AM

## 2021-08-24 DIAGNOSIS — Z01419 Encounter for gynecological examination (general) (routine) without abnormal findings: Secondary | ICD-10-CM | POA: Diagnosis not present

## 2021-08-24 DIAGNOSIS — Z1231 Encounter for screening mammogram for malignant neoplasm of breast: Secondary | ICD-10-CM | POA: Diagnosis not present

## 2021-08-24 DIAGNOSIS — Z681 Body mass index (BMI) 19 or less, adult: Secondary | ICD-10-CM | POA: Diagnosis not present

## 2021-09-04 ENCOUNTER — Ambulatory Visit: Payer: BC Managed Care – PPO | Admitting: Cardiology

## 2021-09-04 ENCOUNTER — Encounter: Payer: Self-pay | Admitting: Cardiology

## 2021-09-04 ENCOUNTER — Other Ambulatory Visit: Payer: Self-pay

## 2021-09-04 DIAGNOSIS — I351 Nonrheumatic aortic (valve) insufficiency: Secondary | ICD-10-CM | POA: Diagnosis not present

## 2021-09-04 DIAGNOSIS — D6869 Other thrombophilia: Secondary | ICD-10-CM | POA: Insufficient documentation

## 2021-09-04 DIAGNOSIS — E782 Mixed hyperlipidemia: Secondary | ICD-10-CM | POA: Diagnosis not present

## 2021-09-04 DIAGNOSIS — I480 Paroxysmal atrial fibrillation: Secondary | ICD-10-CM

## 2021-09-04 DIAGNOSIS — I4891 Unspecified atrial fibrillation: Secondary | ICD-10-CM

## 2021-09-04 DIAGNOSIS — I701 Atherosclerosis of renal artery: Secondary | ICD-10-CM

## 2021-09-04 MED ORDER — ROSUVASTATIN CALCIUM 10 MG PO TABS
10.0000 mg | ORAL_TABLET | Freq: Every day | ORAL | 3 refills | Status: DC
  Filled 2021-12-04: qty 90, 90d supply, fill #0
  Filled 2022-02-19: qty 90, 90d supply, fill #1
  Filled 2022-05-16: qty 90, 90d supply, fill #2
  Filled 2022-08-20: qty 90, 90d supply, fill #3

## 2021-09-04 NOTE — Assessment & Plan Note (Signed)
Currently on Crestor 10 mg daily.  LDL 81.  No myalgias.  Continue with current medical management.

## 2021-09-04 NOTE — Assessment & Plan Note (Signed)
Continue with current medical management.  No changes made.  On flecainide very low-dose 25 mg twice daily.  No change in medical management.

## 2021-09-04 NOTE — Progress Notes (Signed)
Cardiology Office Note:    Date:  09/04/2021   ID:  Robin Collins, DOB 17-Dec-1945, MRN [id number]  PCP:  [name], MD   [facility] Cardiologist:  Candee Furbish, MD Electrophysiologist:  Lysne Grayer, MD     Referring MD: [name], MD    History of Present Illness:    Robin Collins is a 76 y.o. female here for the follow-up of paroxysmal atrial fibrillation with breakthrough December 2022.  Associated with high stress.  Started flecainide in 2019. They and easily with an extra half of flecainide.  Denies any fevers chills nausea vomiting syncope bleeding.  Right renal artery stenosis successfully stented on 12/07/2019 by Dr. Gwenlyn Found.  Blood pressure at last visit here with [name] was elevated.  She was stressed because of check-in issues in the lobby.  Today it is normal.  She shows me 4 separate episodes of atrial fibrillation, 3 from October to December 2022.  A small additional flecainide helps.  No bleeding issues.  Past Medical History:  Diagnosis Date   Aortic regurgitation    Cancer ([place])    basal cell   Cataract    COLONIC POLYPS, ADENOMATOUS 09/22/2007   Qualifier: Diagnosis of  By: Ronnald Ramp RN, CGRN, Sheri     Costochondritis    occ. flares, last 3-4 weeks ago.   Depression    mild   Dysphagia, pharyngoesophageal phase 01/20/2015   MITRAL VALVE PROLAPSE 09/22/2007   Qualifier: Diagnosis of  By: Ronnald Ramp RN, CGRN, Sheri     Multiple thyroid nodules    sees Dr. Conley Rolls every 6 months   Palpitations    Paroxysmal atrial fibrillation ([place])    "palpitations"- occ.now-not frequent   Temporomandibular joint-pain-dysfunction syndrome (TMJ) 11/16/2010    Past Surgical History:  Procedure Laterality Date   CATARACT EXTRACTION, BILATERAL     COLONOSCOPY W/ POLYPECTOMY     last 12'15   COSMETIC SURGERY     "facial"   DILATION AND CURETTAGE OF UTERUS     ESOPHAGOGASTRODUODENOSCOPY (EGD) WITH PROPOFOL N/A 01/20/2015   Procedure:  ESOPHAGOGASTRODUODENOSCOPY (EGD) WITH PROPOFOL;  Surgeon: Lafayette Dragon, MD;  Location: [facility];  Service: Endoscopy;  Laterality: N/A;   EXCISIONAL HEMORRHOIDECTOMY     EYE SURGERY     PERIPHERAL VASCULAR INTERVENTION Right 12/07/2019   Procedure: PERIPHERAL VASCULAR INTERVENTION;  Surgeon: Lorretta Harp, MD;  Location: [facility];  Service: Cardiovascular;  Laterality: Right;  renal stent   RENAL ANGIOGRAPHY N/A 12/07/2019   Procedure: RENAL ANGIOGRAPHY;  Surgeon: Lorretta Harp, MD;  Location: [facility];  Service: Cardiovascular;  Laterality: N/A;   SAVORY DILATION N/A 01/20/2015   Procedure: SAVORY DILATION;  Surgeon: Lafayette Dragon, MD;  Location: [facility];  Service: Endoscopy;  Laterality: N/A;   TUBAL LIGATION      Current Medications: Current Meds  Medication Sig   acetaminophen (TYLENOL) 500 MG tablet Take 500-1,000 mg by mouth every 6 (six) hours as needed for moderate pain or headache.    ALPRAZolam (XANAX) 0.25 MG tablet Take 0.125 mg by mouth daily as needed for anxiety.    aspirin EC 81 MG tablet Take 81 mg by mouth daily. Swallow whole.   atenolol (TENORMIN) 50 MG tablet TAKE 1 TABLET TWICE A DAY   B Complex Vitamins (VITAMIN B COMPLEX PO) Take 1 tablet by mouth daily.    busPIRone (BUSPAR) 10 MG tablet Take 10 mg by mouth 2 (two) times daily.  Calcium Carb-Cholecalciferol (CALCIUM 600 + D PO) Take 2 tablets by mouth daily.   Cholecalciferol (DIALYVITE VITAMIN D 5000) 125 MCG (5000 UT) capsule Take 5,000 Units by mouth daily.   ELIQUIS 5 MG TABS tablet TAKE 1 TABLET BY MOUTH TWICE DAILY   flecainide (TAMBOCOR) 50 MG tablet Take 0.5 tablets (25 mg total) by mouth 2 (two) times daily.   Menthol, Topical Analgesic, (BIOFREEZE EX) Apply 1 application topically daily as needed (back/shoulder pain).   Multiple Vitamin (MULTIVITAMIN) tablet Take 1 tablet by mouth daily.   Polyethylene Glycol 400 (BLINK TEARS) 0.25 % SOLN Place 1 drop into both eyes  daily as needed (dry eyes).   [DISCONTINUED] rosuvastatin (CRESTOR) 10 MG tablet Take 1 tablet (10 mg total) by mouth daily.   Current Facility-Administered Medications for the 09/04/21 encounter (Office Visit) with Jerline Pain, MD  Medication   sodium chloride flush (NS) 0.9 % injection 3 mL     Allergies:   Augmentin [amoxicillin-pot clavulanate] and Vioxx [rofecoxib]   Social History   Socioeconomic History   Marital status: Married    Spouse name: Not on file   Number of children: Not on file   Years of education: Not on file   Highest education level: Not on file  Occupational History   Not on file  Tobacco Use   Smoking status: Former    Types: Cigarettes   Smokeless tobacco: Never   Tobacco comments:    social smoker -no use in 30 yrs  Vaping Use   Vaping Use: Never used  Substance and Sexual Activity   Alcohol use: Yes    Comment: 2-3 times monthly shots of liquor/mixed drinks   Drug use: No   Sexual activity: Not on file  Other Topics Concern   Not on file  Social History Narrative   Not on file   Social Determinants of Health   Financial Resource Strain: Not on file  Food Insecurity: Not on file  Transportation Needs: Not on file  Physical Activity: Not on file  Stress: Not on file  Social Connections: Not on file     Family History: The patient's family history includes Cancer in her brother and father; Diabetes in her brother, brother, mother, sister, and sister; Kidney disease in her father.  ROS:   Please see the history of present illness.    No fevers chills nausea vomiting syncope chest pain all other systems reviewed and are negative.  EKGs/Labs/Other Studies Reviewed:    The following studies were reviewed today: Echocardiogram 09/16/2019-mild aortic regurgitation previously described as moderate.  Normal EF.   EKG: Prior EKG shows sinus bradycardia 56 with no other issues.  Normal intervals.  Recent Labs: 11/30/2020: ALT 35  Recent  Lipid Panel    Component Value Date/Time   CHOL 189 11/30/2020 0753   TRIG 86 11/30/2020 0753   HDL 93 11/30/2020 0753   CHOLHDL 2.0 11/30/2020 0753   LDLCALC 81 11/30/2020 0753     Risk Assessment/Calculations:              Physical Exam:    VS:  BP 130/70 (BP Location: Left Arm, Patient Position: Sitting, Cuff Size: Normal)    Pulse (!) 58    Ht 5\' 3"  (1.6 m)    Wt 103 lb (46.7 kg)    SpO2 97%    BMI 18.25 kg/m     Wt Readings from Last 3 Encounters:  09/04/21 103 lb (46.7 kg)  08/17/21 102 lb (46.3  kg)  06/06/21 102 lb (46.3 kg)     GEN:  Thin, well developed in no acute distress HEENT: Normal NECK: No JVD; No carotid bruits LYMPHATICS: No lymphadenopathy CARDIAC: RRR, no murmurs, no rubs, gallops RESPIRATORY:  Clear to auscultation without rales, wheezing or rhonchi  ABDOMEN: Soft, non-tender, non-distended MUSCULOSKELETAL:  No edema; No deformity  SKIN: Warm and dry NEUROLOGIC:  Alert and oriented x 3 PSYCHIATRIC:  Normal affect   ASSESSMENT:    1. PAF (paroxysmal atrial fibrillation) ([place])   2. Hypercoagulable state due to atrial fibrillation ([place])   3. Mixed hyperlipidemia   4. Nonrheumatic aortic valve insufficiency   5. Renal artery stenosis ([facility])    PLAN:    In order of problems listed above:  PAF (paroxysmal atrial fibrillation) ([place]) Continue with current medical management.  No changes made.  On flecainide very low-dose 25 mg twice daily.  No change in medical management.  Hypercoagulable state due to atrial fibrillation ([facility]) Continue with Eliquis 5 mg twice a day.  Overall doing quite well.  No bleeding on this high risk medication.  Continue to monitor labs closely.  Hemoglobin 13.7 creatinine 1.24 at last check.  Hyperlipidemia Currently on Crestor 10 mg daily.  LDL 81.  No myalgias.  Continue with current medical management.  Nonrheumatic aortic valve insufficiency Mild to moderate aortic valve regurgitation.  Continue to monitor with  echocardiogram.  Prior echo personally reviewed.  Prior echo 2021.  Renal artery stenosis ([facility]) Right renal artery stenting.  Stable.         Medication Adjustments/Labs and Tests Ordered: Current medicines are reviewed at length with the patient today.  Concerns regarding medicines are outlined above.  No orders of the defined types were placed in this encounter.  Meds ordered this encounter  Medications   rosuvastatin (CRESTOR) 10 MG tablet    Sig: Take 1 tablet (10 mg total) by mouth daily.    Dispense:  90 tablet    Refill:  3    Patient Instructions  Medication Instructions:  The current medical regimen is effective;  continue present plan and medications.  *If you need a refill on your cardiac medications before your next appointment, please call your pharmacy*  Follow-Up: At [facility], you and your health needs are our priority.  As part of our continuing mission to provide you with exceptional heart care, we have created designated Provider Care Teams.  These Care Teams include your primary Cardiologist (physician) and Advanced Practice Providers (APPs -  Physician Assistants and Nurse Practitioners) who all work together to provide you with the care you need, when you need it.  We recommend signing up for the patient portal called "MyChart".  Sign up information is provided on this After Visit Summary.  MyChart is used to connect with patients for Virtual Visits (Telemedicine).  Patients are able to view lab/test results, encounter notes, upcoming appointments, etc.  Non-urgent messages can be sent to your provider as well.   To learn more about what you can do with MyChart, go to [email or web address].    Your next appointment:   1 year(s)  The format for your next appointment:   In Person  Provider:   Candee Furbish, MD     Thank you for choosing [facility]!!      Signed, Candee Furbish, MD  09/04/2021 9:33 AM    [place]

## 2021-09-04 NOTE — Assessment & Plan Note (Signed)
Continue with Eliquis 5 mg twice a day.  Overall doing quite well.  No bleeding on this high risk medication.  Continue to monitor labs closely.  Hemoglobin 13.7 creatinine 1.24 at last check.

## 2021-09-04 NOTE — Assessment & Plan Note (Signed)
Mild to moderate aortic valve regurgitation.  Continue to monitor with echocardiogram.  Prior echo personally reviewed.  Prior echo 2021.

## 2021-09-04 NOTE — Assessment & Plan Note (Signed)
Right renal artery stenting.  Stable.

## 2021-09-04 NOTE — Patient Instructions (Signed)
Medication Instructions:  The current medical regimen is effective;  continue present plan and medications.  *If you need a refill on your cardiac medications before your next appointment, please call your pharmacy*  Follow-Up: At [facility], you and your health needs are our priority.  As part of our continuing mission to provide you with exceptional heart care, we have created designated Provider Care Teams.  These Care Teams include your primary Cardiologist (physician) and Advanced Practice Providers (APPs -  Physician Assistants and Nurse Practitioners) who all work together to provide you with the care you need, when you need it.  We recommend signing up for the patient portal called "MyChart".  Sign up information is provided on this After Visit Summary.  MyChart is used to connect with patients for Virtual Visits (Telemedicine).  Patients are able to view lab/test results, encounter notes, upcoming appointments, etc.  Non-urgent messages can be sent to your provider as well.   To learn more about what you can do with MyChart, go to [email or web address].    Your next appointment:   1 year(s)  The format for your next appointment:   In Person  Provider:   Mark Skains, MD   Thank you for choosing [facility]!!    

## 2021-10-20 ENCOUNTER — Other Ambulatory Visit (HOSPITAL_COMMUNITY): Payer: Self-pay

## 2021-10-21 ENCOUNTER — Other Ambulatory Visit (HOSPITAL_COMMUNITY): Payer: Self-pay

## 2021-10-24 ENCOUNTER — Other Ambulatory Visit: Payer: Self-pay

## 2021-10-24 ENCOUNTER — Other Ambulatory Visit (HOSPITAL_COMMUNITY): Payer: Self-pay

## 2021-10-24 MED ORDER — FLECAINIDE ACETATE 50 MG PO TABS
25.0000 mg | ORAL_TABLET | Freq: Two times a day (BID) | ORAL | 3 refills | Status: DC
  Filled 2021-10-24 – 2022-02-19 (×2): qty 90, 90d supply, fill #0
  Filled 2022-05-16: qty 90, 90d supply, fill #1
  Filled 2022-08-20: qty 90, 90d supply, fill #2

## 2021-10-24 MED ORDER — ATENOLOL 50 MG PO TABS
50.0000 mg | ORAL_TABLET | Freq: Two times a day (BID) | ORAL | 3 refills | Status: DC
  Filled 2021-10-24 – 2021-11-20 (×2): qty 180, 90d supply, fill #0
  Filled 2022-02-13: qty 180, 90d supply, fill #1
  Filled 2022-05-16: qty 180, 90d supply, fill #2
  Filled 2022-08-20: qty 180, 90d supply, fill #3

## 2021-10-25 ENCOUNTER — Other Ambulatory Visit (HOSPITAL_COMMUNITY): Payer: Self-pay

## 2021-10-25 MED ORDER — BUSPIRONE HCL 10 MG PO TABS
ORAL_TABLET | ORAL | 4 refills | Status: DC
  Filled 2021-10-25 – 2022-02-19 (×2): qty 180, 90d supply, fill #0
  Filled 2022-05-16: qty 180, 90d supply, fill #1
  Filled 2022-08-20: qty 180, 90d supply, fill #2

## 2021-10-26 ENCOUNTER — Other Ambulatory Visit: Payer: Self-pay | Admitting: Cardiology

## 2021-11-06 ENCOUNTER — Other Ambulatory Visit (HOSPITAL_COMMUNITY): Payer: Self-pay

## 2021-11-10 ENCOUNTER — Encounter: Payer: Self-pay | Admitting: Internal Medicine

## 2021-11-10 ENCOUNTER — Ambulatory Visit: Payer: HMO | Admitting: Internal Medicine

## 2021-11-10 VITALS — BP 128/74 | HR 75 | Ht 63.0 in | Wt 103.0 lb

## 2021-11-10 DIAGNOSIS — I480 Paroxysmal atrial fibrillation: Secondary | ICD-10-CM | POA: Diagnosis not present

## 2021-11-10 NOTE — Patient Instructions (Signed)
Medication Instructions:  ?Your physician recommends that you continue on your current medications as directed. Please refer to the Current Medication list given to you today. ? ?*If you need a refill on your cardiac medications before your next appointment, please call your pharmacy* ? ?Testing/Procedures: ?Your physician has recommended that you have a Cardioversion (DCCV). Electrical Cardioversion uses a jolt of electricity to your heart either through paddles or wired patches attached to your chest. This is a controlled, usually prescheduled, procedure. Defibrillation is done under light anesthesia in the hospital, and you usually go home the day of the procedure. This is done to get your heart back into a normal rhythm. You are not awake for the procedure. Please see the instruction sheet given to you today. ? ?Follow-Up: ?At [facility], you and your health needs are our priority.  As part of our continuing mission to provide you with exceptional heart care, we have created designated Provider Care Teams.  These Care Teams include your primary Cardiologist (physician) and Advanced Practice Providers (APPs -  Physician Assistants and Nurse Practitioners) who all work together to provide you with the care you need, when you need it. ? ? ?Other Instructions ?You are scheduled for a Cardioversion on April 28th with Dr. Harl Bowie.  Please arrive at the [facility] (Main Entrance A) at [facility]: [address] [place], [place] [address] at 6:30 am. (1 hour prior to procedure unless lab work is needed; if lab work is needed arrive 1.5 hours ahead) ? ?DIET: Nothing to eat or drink after midnight except a sip of water with medications (see medication instructions below) ? ?FYI: For your safety, and to allow [place] to monitor your vital signs accurately during the surgery/procedure we request that   ?if you have artificial nails, gel coating, SNS etc. Please have those removed prior to your surgery/procedure. Not  having the nail coverings /polish removed may result in cancellation or delay of your surgery/procedure. ? ? ?Medication Instructions: ? ?Continue your anticoagulant: Eiquis ?You will need to continue your anticoagulant after your procedure until you  are told by your  ?Provider that it is safe to stop ? ? ?Labs:Come to the lab at [facility] between the hours of 7:15am am and 5:00 pm on Monday, April 24th. You do not have to be fasting. ? ?You must have a responsible person to drive you home and stay in the waiting area during your procedure. Failure to do so could result in cancellation. ? ?Interior and spatial designer cards. ? ?*Special Note: Every effort is made to have your procedure done on time. Occasionally there are emergencies that occur at the hospital that may cause delays. Please be patient if a delay does occur.   ?

## 2021-11-10 NOTE — Progress Notes (Signed)
? ? ? ? ?Patient Care Team: ?[name], MD as PCP - General (Family Medicine) ?Jerline Pain, MD as PCP - Cardiology (Cardiology) ?Vangieson Grayer, MD as PCP - Electrophysiology (Cardiology) ? ? ?HPI ? ?Robin Collins is a 76 y.o. female ?Presented for nursing visit because of concerns about recurrent atrial arrhythmia.  ECG to have a slow atrial flutter with more rapid than normal rates.  She takes anticoagulation.  In the past, her arrhythmias, as evidenced by her wearable technology, have reverted to normal typically within a couple of hours.  This episode has been going on since Sunday as suggested by heart rates greater than her normal sinus which is in the 50s. ? ?Taking her anticoagulation without difficulty without bleeding ? ?Records and Results Reviewed ? ?Past Medical History:  ?Diagnosis Date  ? Aortic regurgitation   ? Cancer [facility])   ? basal cell  ? Cataract   ? COLONIC POLYPS, ADENOMATOUS 09/22/2007  ? Qualifier: Diagnosis of  By: Ronnald Ramp RN, [place], [place]    ? Costochondritis   ? occ. flares, last 3-4 weeks ago.  ? Depression   ? mild  ? Dysphagia, pharyngoesophageal phase 01/20/2015  ? MITRAL VALVE PROLAPSE 09/22/2007  ? Qualifier: Diagnosis of  By: Ronnald Ramp RN, [place], [place]    ? Multiple thyroid nodules   ? sees Dr. Conley Rolls every 6 months  ? Palpitations   ? Paroxysmal atrial fibrillation ([facility])   ? "palpitations"- occ.now-not frequent  ? Temporomandibular joint-pain-dysfunction syndrome (TMJ) 11/16/2010  ? ? ?Past Surgical History:  ?Procedure Laterality Date  ? CATARACT EXTRACTION, BILATERAL    ? COLONOSCOPY W/ POLYPECTOMY    ? last 12'15  ? COSMETIC SURGERY    ? "facial"  ? DILATION AND CURETTAGE OF UTERUS    ? ESOPHAGOGASTRODUODENOSCOPY (EGD) WITH PROPOFOL N/A 01/20/2015  ? Procedure: ESOPHAGOGASTRODUODENOSCOPY (EGD) WITH PROPOFOL;  Surgeon: Lafayette Dragon, MD;  [facility];  Service: Endoscopy;  Laterality: N/A;  ? EXCISIONAL HEMORRHOIDECTOMY    ? EYE SURGERY    ? PERIPHERAL VASCULAR  INTERVENTION Right 12/07/2019  ? Procedure: PERIPHERAL VASCULAR INTERVENTION;  Surgeon: Lorretta Harp, MD;  Location: [place] [facility];  Service: Cardiovascular;  Laterality: Right;  renal stent  ? RENAL ANGIOGRAPHY N/A 12/07/2019  ? Procedure: RENAL ANGIOGRAPHY;  Surgeon: Lorretta Harp, MD;  Location: [place] [facility];  Service: Cardiovascular;  Laterality: N/A;  ? SAVORY DILATION N/A 01/20/2015  ? Procedure: SAVORY DILATION;  Surgeon: Lafayette Dragon, MD;  [facility];  Service: Endoscopy;  Laterality: N/A;  ? TUBAL LIGATION    ? ? ?Current Meds  ?Medication Sig  ? acetaminophen (TYLENOL) 500 MG tablet Take 500-1,000 mg by mouth every 6 (six) hours as needed for moderate pain or headache.   ? ALPRAZolam (XANAX) 0.25 MG tablet Take 0.125 mg by mouth daily as needed for anxiety.   ? apixaban (ELIQUIS) 5 MG TABS tablet TAKE 1 TABLET BY MOUTH TWICE DAILY  ? aspirin EC 81 MG tablet Take 81 mg by mouth daily. Swallow whole.  ? atenolol (TENORMIN) 50 MG tablet Take 1 tablet by mouth 2 times daily.  ? B Complex Vitamins (VITAMIN B COMPLEX PO) Take 1 tablet by mouth daily.   ? busPIRone (BUSPAR) 10 MG tablet Take 10 mg by mouth 2 (two) times daily.  ? busPIRone (BUSPAR) 10 MG tablet Take 1 tablet by mouth twice daily  ? Calcium Carb-Cholecalciferol (CALCIUM 600 + D PO) Take 2 tablets by mouth  daily.  ? Cholecalciferol (DIALYVITE VITAMIN D 5000) 125 MCG (5000 UT) capsule Take 5,000 Units by mouth daily.  ? flecainide (TAMBOCOR) 50 MG tablet Take 1/2 tablet by mouth 2 times daily.  ? Menthol, Topical Analgesic, (BIOFREEZE EX) Apply 1 application topically daily as needed (back/shoulder pain).  ? Multiple Vitamin (MULTIVITAMIN) tablet Take 1 tablet by mouth daily.  ? Polyethylene Glycol 400 (BLINK TEARS) 0.25 % SOLN Place 1 drop into both eyes daily as needed (dry eyes).  ? rosuvastatin (CRESTOR) 10 MG tablet Take 1 tablet (10 mg total) by mouth daily.  ? ?Current Facility-Administered Medications for the  11/10/21 encounter (Office Visit) with Deboraha Sprang, MD  ?Medication  ? sodium chloride flush (NS) 0.9 % injection 3 mL  ? ? ?Allergies  ?Allergen Reactions  ? Augmentin [Amoxicillin-Pot Clavulanate] Nausea And Vomiting  ? Vioxx [Rofecoxib] Palpitations  ? ? ? ? ?Review of Systems negative except from HPI and PMH ? ?Physical Exam ?BP 128/74   Pulse 75   Ht '5\' 3"'$  (1.6 m)   Wt 103 lb (46.7 kg)   BMI 18.25 kg/m?  ?Well developed and well nourished in no acute distress ?HENT normal ?E scleral and icterus clear ?Neck Supple ?JVP flat; carotids brisk and full ?Clear to ausculation ?Regular rate and rhythm, no murmurs gallops or rub ?Soft with active bowel sounds ?No clubbing cyanosis  Edema ?Alert and oriented, grossly normal motor and sensory function ?Skin Warm and Dry ? ?ECG  atrial flutter atypical ? ?CrCl cannot be calculated (Patient's most recent lab result is older than the maximum 21 days allowed.). ? ? ?Assessment and  Plan ?Atrial flutter-atypical ? ?Shortness of breath associate with the above ? ?Flecainide-low-dose ? ? ? ?Has persistent atrial flutter at this point.  Would anticipate increasing the flecainide following the cardioversion, I think in the context of very slow flutter and the concern that I would have initially is that the flutter was slow more and she would go 1: 1.  We discussed cardioversion.  She is agreeable.  She has not missed any anticoagulation ? ? ? ? ? ? ? ?Current medicines are reviewed at length with the patient today .  The patient does not  have concerns regarding medicines. ? ?

## 2021-11-16 ENCOUNTER — Encounter: Payer: Self-pay | Admitting: Cardiology

## 2021-11-16 ENCOUNTER — Other Ambulatory Visit (HOSPITAL_COMMUNITY): Payer: Self-pay

## 2021-11-16 MED ORDER — BUSPIRONE HCL 10 MG PO TABS
ORAL_TABLET | ORAL | 4 refills | Status: DC
  Filled 2021-11-16: qty 180, 90d supply, fill #0
  Filled 2022-11-20: qty 180, 90d supply, fill #1

## 2021-11-17 ENCOUNTER — Encounter (HOSPITAL_COMMUNITY): Payer: Self-pay | Admitting: Internal Medicine

## 2021-11-20 ENCOUNTER — Other Ambulatory Visit (HOSPITAL_COMMUNITY): Payer: Self-pay

## 2021-11-20 ENCOUNTER — Other Ambulatory Visit: Payer: HMO

## 2021-11-20 MED FILL — Apixaban Tab 5 MG: ORAL | 90 days supply | Qty: 180 | Fill #0 | Status: AC

## 2021-11-24 ENCOUNTER — Encounter (HOSPITAL_COMMUNITY): Admission: RE | Payer: Self-pay | Source: Home / Self Care

## 2021-11-24 ENCOUNTER — Ambulatory Visit (HOSPITAL_COMMUNITY): Admission: RE | Admit: 2021-11-24 | Payer: HMO | Source: Home / Self Care | Admitting: Internal Medicine

## 2021-11-24 SURGERY — CARDIOVERSION
Anesthesia: General

## 2021-12-04 ENCOUNTER — Other Ambulatory Visit (HOSPITAL_COMMUNITY): Payer: Self-pay

## 2022-02-10 ENCOUNTER — Other Ambulatory Visit: Payer: Self-pay | Admitting: Internal Medicine

## 2022-02-12 ENCOUNTER — Other Ambulatory Visit (HOSPITAL_COMMUNITY): Payer: Self-pay

## 2022-02-12 MED ORDER — APIXABAN 5 MG PO TABS
ORAL_TABLET | ORAL | 1 refills | Status: DC
  Filled 2022-02-19: qty 180, 90d supply, fill #0
  Filled 2022-05-16: qty 180, 90d supply, fill #1

## 2022-02-12 NOTE — Telephone Encounter (Signed)
Prescription refill request for Eliquis received. Indication:Afib Last office visit:4/23 Scr:1.2 Age: 76 Weight:46.7 kg  Prescription refilled

## 2022-02-19 ENCOUNTER — Other Ambulatory Visit (HOSPITAL_COMMUNITY): Payer: Self-pay

## 2022-03-19 NOTE — Progress Notes (Signed)
PCP:  [name], MD Primary Cardiologist: Candee Furbish, MD Electrophysiologist: Spieker Grayer, MD   Robin Collins is a 76 y.o. female seen today for Cipollone Grayer, MD for routine electrophysiology followup.  Since last being seen in our clinic the patient reports doing well overall. She has breakthrough fib/flutter back in April secondary to febrile illness.  she denies chest pain, palpitations, dyspnea, PND, orthopnea, nausea, vomiting, dizziness, syncope, edema, weight gain, or early satiety.  Past Medical History:  Diagnosis Date   Aortic regurgitation    Cancer ([place])    basal cell   Cataract    COLONIC POLYPS, ADENOMATOUS 09/22/2007   Qualifier: Diagnosis of  By: Ronnald Ramp RN, CGRN, Sheri     Costochondritis    occ. flares, last 3-4 weeks ago.   Depression    mild   Dysphagia, pharyngoesophageal phase 01/20/2015   MITRAL VALVE PROLAPSE 09/22/2007   Qualifier: Diagnosis of  By: Ronnald Ramp RN, CGRN, Sheri     Multiple thyroid nodules    sees Dr. Conley Rolls every 6 months   Palpitations    Paroxysmal atrial fibrillation ([place])    "palpitations"- occ.now-not frequent   Temporomandibular joint-pain-dysfunction syndrome (TMJ) 11/16/2010   Past Surgical History:  Procedure Laterality Date   CATARACT EXTRACTION, BILATERAL     COLONOSCOPY W/ POLYPECTOMY     last 12'15   COSMETIC SURGERY     "facial"   DILATION AND CURETTAGE OF UTERUS     ESOPHAGOGASTRODUODENOSCOPY (EGD) WITH PROPOFOL N/A 01/20/2015   Procedure: ESOPHAGOGASTRODUODENOSCOPY (EGD) WITH PROPOFOL;  Surgeon: Lafayette Dragon, MD;  Location: [facility];  Service: Endoscopy;  Laterality: N/A;   EXCISIONAL HEMORRHOIDECTOMY     EYE SURGERY     PERIPHERAL VASCULAR INTERVENTION Right 12/07/2019   Procedure: PERIPHERAL VASCULAR INTERVENTION;  Surgeon: Lorretta Harp, MD;  Location: [place] [facility];  Service: Cardiovascular;  Laterality: Right;  renal stent   RENAL ANGIOGRAPHY N/A 12/07/2019   Procedure: RENAL ANGIOGRAPHY;   Surgeon: Lorretta Harp, MD;  Location: [place] [facility];  Service: Cardiovascular;  Laterality: N/A;   SAVORY DILATION N/A 01/20/2015   Procedure: SAVORY DILATION;  Surgeon: Lafayette Dragon, MD;  Location: [facility];  Service: Endoscopy;  Laterality: N/A;   TUBAL LIGATION      Current Outpatient Medications  Medication Sig Dispense Refill   acetaminophen (TYLENOL) 500 MG tablet Take 500-1,000 mg by mouth every 6 (six) hours as needed for moderate pain or headache.      ALPRAZolam (XANAX) 0.25 MG tablet Take 0.125 mg by mouth daily as needed for anxiety.      apixaban (ELIQUIS) 5 MG TABS tablet TAKE 1 TABLET BY MOUTH TWICE DAILY 180 tablet 1   aspirin EC 81 MG tablet Take 81 mg by mouth daily. Swallow whole.     atenolol (TENORMIN) 50 MG tablet Take 1 tablet by mouth 2 times daily. 180 tablet 3   B Complex Vitamins (VITAMIN B COMPLEX PO) Take 1 tablet by mouth daily.      busPIRone (BUSPAR) 10 MG tablet Take 1 tablet by mouth twice a day. (Patient taking differently: Take 10 mg by mouth 2 (two) times daily.) 180 tablet 4   Calcium Carb-Cholecalciferol (CALCIUM 600 + D PO) Take 2 tablets by mouth daily.     Cholecalciferol (DIALYVITE VITAMIN D 5000) 125 MCG (5000 UT) capsule Take 5,000 Units by mouth daily.     flecainide (TAMBOCOR) 50 MG tablet Take 1/2 tablet by mouth 2 times daily.  90 tablet 3   Menthol, Topical Analgesic, (BIOFREEZE EX) Apply 1 application topically daily as needed (back/shoulder pain).     Multiple Vitamin (MULTIVITAMIN) tablet Take 1 tablet by mouth daily.     Polyethylene Glycol 400 (BLINK TEARS) 0.25 % SOLN Place 1 drop into both eyes daily as needed (dry eyes).     rosuvastatin (CRESTOR) 10 MG tablet Take 1 tablet (10 mg total) by mouth daily. 90 tablet 3   Zinc 30 MG TABS Take 30 mg by mouth daily.     busPIRone (BUSPAR) 10 MG tablet Take 1 tablet by mouth twice daily (Patient not taking: Reported on 03/26/2022) 180 tablet 4   Current Facility-Administered  Medications  Medication Dose Route Frequency Provider Last Rate Last Admin   sodium chloride flush (NS) 0.9 % injection 3 mL  3 mL Intravenous Q12H Lorretta Harp, MD        Allergies  Allergen Reactions   Augmentin [Amoxicillin-Pot Clavulanate] Nausea And Vomiting   Vioxx [Rofecoxib] Palpitations    Social History   Socioeconomic History   Marital status: Married    Spouse name: Not on file   Number of children: Not on file   Years of education: Not on file   Highest education level: Not on file  Occupational History   Not on file  Tobacco Use   Smoking status: Former    Types: Cigarettes   Smokeless tobacco: Never   Tobacco comments:    social smoker -no use in 30 yrs  Vaping Use   Vaping Use: Never used  Substance and Sexual Activity   Alcohol use: Yes    Comment: 2-3 times monthly shots of liquor/mixed drinks   Drug use: No   Sexual activity: Not on file  Other Topics Concern   Not on file  Social History Narrative   Not on file   Social Determinants of Health   Financial Resource Strain: Not on file  Food Insecurity: Not on file  Transportation Needs: Not on file  Physical Activity: Not on file  Stress: Not on file  Social Connections: Not on file  Intimate Partner Violence: Not on file     Review of Systems: All other systems reviewed and are otherwise negative except as noted above.  Physical Exam: Vitals:   03/26/22 1002  BP: (!) 146/70  Pulse: (!) 55  SpO2: 98%  Weight: 105 lb 12.8 oz (48 kg)  Height: '5\' 3"'$  (1.6 m)    GEN- The patient is well appearing, alert and oriented x 3 today.   HEENT: normocephalic, atraumatic; sclera clear, conjunctiva pink; hearing intact; oropharynx clear; neck supple, no JVP Lymph- no cervical lymphadenopathy Lungs- Clear to ausculation bilaterally, normal work of breathing.  No wheezes, rales, rhonchi Heart- Regular rate and rhythm, no murmurs, rubs or gallops, PMI not laterally displaced GI- soft,  non-tender, non-distended, bowel sounds present, no hepatosplenomegaly Extremities- no clubbing, cyanosis, or edema; DP/PT/radial pulses 2+ bilaterally MS- no significant deformity or atrophy Skin- warm and dry, no rash or lesion Psych- euthymic mood, full affect Neuro- strength and sensation are intact  EKG is ordered. Personal review of EKG from today shows sinus brady at 55 bpm  Additional studies reviewed include: Previous EP office notes.   Assessment and Plan:  1.  Paroxysmal atrial fibrillation / flutter Had persistent flutter earlier this year, but converted on flecainide.  Chads2vasc score is 4.  She is on eliquis   2. Moderate AI Followed by Dr Marlou Porch.  3. HTN Stable on current regimen   Follow up with Dr. Quentin Ore in 6 months to establish from Dr. Rayann Heman. (Pt request)   Shirley Friar, PA-C  03/26/22 10:08 AM

## 2022-03-26 ENCOUNTER — Ambulatory Visit: Payer: PPO | Attending: Student | Admitting: Student

## 2022-03-26 ENCOUNTER — Encounter: Payer: Self-pay | Admitting: Student

## 2022-03-26 VITALS — BP 146/70 | HR 55 | Ht 63.0 in | Wt 105.8 lb

## 2022-03-26 DIAGNOSIS — I10 Essential (primary) hypertension: Secondary | ICD-10-CM

## 2022-03-26 DIAGNOSIS — I480 Paroxysmal atrial fibrillation: Secondary | ICD-10-CM | POA: Diagnosis not present

## 2022-03-26 DIAGNOSIS — I351 Nonrheumatic aortic (valve) insufficiency: Secondary | ICD-10-CM

## 2022-03-26 NOTE — Patient Instructions (Signed)
Medication Instructions:  Your physician recommends that you continue on your current medications as directed. Please refer to the Current Medication list given to you today.  *If you need a refill on your cardiac medications before your next appointment, please call your pharmacy*   Lab Work: None If you have labs (blood work) drawn today and your tests are completely normal, you will receive your results only by: [place] (if you have MyChart) OR A paper copy in the mail If you have any lab test that is abnormal or we need to change your treatment, we will call you to review the results.   Follow-Up: At [facility], you and your health needs are our priority.  As part of our continuing mission to provide you with exceptional heart care, we have created designated Provider Care Teams.  These Care Teams include your primary Cardiologist (physician) and Advanced Practice Providers (APPs -  Physician Assistants and Nurse Practitioners) who all work together to provide you with the care you need, when you need it.  Your next appointment:   6 month(s)  The format for your next appointment:   In Person  Provider:   Lars Mage, MD

## 2022-03-27 LAB — BASIC METABOLIC PANEL
BUN/Creatinine Ratio: 14 (ref 12–28)
BUN: 15 mg/dL (ref 8–27)
CO2: 23 mmol/L (ref 20–29)
Calcium: 10 mg/dL (ref 8.7–10.3)
Chloride: 104 mmol/L (ref 96–106)
Creatinine, Ser: 1.09 mg/dL — ABNORMAL HIGH (ref 0.57–1.00)
Glucose: 86 mg/dL (ref 70–99)
Potassium: 5.1 mmol/L (ref 3.5–5.2)
Sodium: 142 mmol/L (ref 134–144)
eGFR: 52 mL/min/{1.73_m2} — ABNORMAL LOW (ref >59)

## 2022-03-27 LAB — CBC
Hematocrit: 38 % (ref 34.0–46.6)
Hemoglobin: 13.1 g/dL (ref 11.1–15.9)
MCH: 32.9 pg (ref 26.6–33.0)
MCHC: 34.5 g/dL (ref 31.5–35.7)
MCV: 96 fL (ref 79–97)
Platelets: 212 10*3/uL (ref 150–450)
RBC: 3.98 x10E6/uL (ref 3.77–5.28)
RDW: 12.5 % (ref 11.7–15.4)
WBC: 7.2 10*3/uL (ref 3.4–10.8)

## 2022-05-14 ENCOUNTER — Other Ambulatory Visit (HOSPITAL_BASED_OUTPATIENT_CLINIC_OR_DEPARTMENT_OTHER): Payer: Self-pay

## 2022-05-14 MED ORDER — COMIRNATY 30 MCG/0.3ML IM SUSP
INTRAMUSCULAR | 0 refills | Status: AC
Start: 2022-05-14 — End: ?
  Filled 2022-05-14: qty 0.3, 1d supply, fill #0

## 2022-05-16 ENCOUNTER — Other Ambulatory Visit (HOSPITAL_COMMUNITY): Payer: Self-pay

## 2022-05-17 ENCOUNTER — Other Ambulatory Visit (HOSPITAL_COMMUNITY): Payer: Self-pay

## 2022-05-30 ENCOUNTER — Other Ambulatory Visit (HOSPITAL_BASED_OUTPATIENT_CLINIC_OR_DEPARTMENT_OTHER): Payer: Self-pay

## 2022-05-30 MED ORDER — FLUAD QUADRIVALENT 0.5 ML IM PRSY
PREFILLED_SYRINGE | INTRAMUSCULAR | 0 refills | Status: AC
  Filled 2022-05-30 (×2): qty 0.5, 1d supply, fill #0

## 2022-05-31 ENCOUNTER — Ambulatory Visit (HOSPITAL_COMMUNITY)
Admission: RE | Admit: 2022-05-31 | Discharge: 2022-05-31 | Disposition: A | Discharge status: Home / Self Care | Payer: PPO | Source: Ambulatory Visit | Attending: Cardiovascular Disease | Admitting: Cardiovascular Disease

## 2022-05-31 DIAGNOSIS — Z9889 Other specified postprocedural states: Secondary | ICD-10-CM | POA: Diagnosis not present

## 2022-05-31 DIAGNOSIS — Z4889 Encounter for other specified surgical aftercare: Secondary | ICD-10-CM | POA: Diagnosis not present

## 2022-08-11 ENCOUNTER — Other Ambulatory Visit: Payer: Self-pay | Admitting: Internal Medicine

## 2022-08-13 ENCOUNTER — Other Ambulatory Visit (HOSPITAL_COMMUNITY): Payer: Self-pay

## 2022-08-13 MED ORDER — APIXABAN 5 MG PO TABS
ORAL_TABLET | ORAL | 1 refills | Status: DC
  Filled 2022-08-20: qty 180, 90d supply, fill #0
  Filled 2022-11-20: qty 180, 90d supply, fill #1

## 2022-08-13 NOTE — Telephone Encounter (Signed)
Prescription refill request for Eliquis received. Indication:afib Last office visit:8/23 Scr:1.0 Age: 77 Weight:48  kg  Prescription refilled

## 2022-08-20 ENCOUNTER — Other Ambulatory Visit (HOSPITAL_COMMUNITY): Payer: Self-pay

## 2022-10-01 NOTE — Progress Notes (Unsigned)
  Electrophysiology Office Follow up Visit Note:    Date:  10/01/2022   ID:  Robin Collins, DOB 06-Apr-1946, MRN [id number]  PCP:  [name], MD  [place] Cardiologist:  Candee Furbish, MD  [facility] Electrophysiologist:  Vickie Epley, MD    Interval History:    Robin Collins is a 77 y.o. female who presents for a follow up visit.   The patient last saw [name] November 10, 2021.  The patient saw [name] March 26, 2022.  The patient has a history of paroxysmal atrial fibrillation.  She has had atrial fibrillation and flutter.  She is on Eliquis for stroke prophylaxis.  She is prescribed flecainide 25 mg by mouth twice daily in addition to atenolol.      Past medical, surgical, social and family history were reviewed.  ROS:   Please see the history of present illness.    All other systems reviewed and are negative.  EKGs/Labs/Other Studies Reviewed:    The following studies were reviewed today:  EKG:  The ekg ordered today demonstrates ***   Physical Exam:    VS:  There were no vitals taken for this visit.    Wt Readings from Last 3 Encounters:  03/26/22 105 lb 12.8 oz (48 kg)  11/10/21 103 lb (46.7 kg)  09/04/21 103 lb (46.7 kg)     GEN: *** Well nourished, well developed in no acute distress CARDIAC: ***RRR, no murmurs, rubs, gallops RESPIRATORY:  Clear to auscultation without rales, wheezing or rhonchi       ASSESSMENT:    No diagnosis found. PLAN:    In order of problems listed above:  #Paroxysmal atrial fibrillation #High risk medication use-flecainide   #Hypertension *** goal today.  Recommend checking blood pressures 1-2 times per week at home and recording the values.  Recommend bringing these recordings to the primary care physician.   Follow-up 6 months with APP.      Signed, Lars Mage, MD, [facility], [facility] 10/01/2022 9:00 PM    Electrophysiology [facility]

## 2022-10-02 ENCOUNTER — Ambulatory Visit: Payer: PPO | Attending: Cardiology | Admitting: Cardiology

## 2022-10-02 ENCOUNTER — Encounter: Payer: Self-pay | Admitting: Cardiology

## 2022-10-02 VITALS — BP 124/64 | HR 49 | Ht 63.0 in | Wt 105.0 lb

## 2022-10-02 DIAGNOSIS — I480 Paroxysmal atrial fibrillation: Secondary | ICD-10-CM

## 2022-10-02 DIAGNOSIS — I10 Essential (primary) hypertension: Secondary | ICD-10-CM

## 2022-10-02 DIAGNOSIS — Z79899 Other long term (current) drug therapy: Secondary | ICD-10-CM | POA: Diagnosis not present

## 2022-10-02 NOTE — Progress Notes (Signed)
  Electrophysiology Office Follow up Visit Note:    Date:  10/02/2022   ID:  Robin Collins, DOB 22-Mar-1946, MRN [id number]  PCP:  [name], MD  [place] Cardiologist:  Candee Furbish, MD  [facility] Electrophysiologist:  Vickie Epley, MD    Interval History:    Robin Collins is a 77 y.o. female who presents for a follow up visit.   The patient last saw [name] November 10, 2021.  The patient saw [name] March 26, 2022.  The patient has a history of paroxysmal atrial fibrillation.  She has had atrial fibrillation and flutter.  She is on Eliquis for stroke prophylaxis.  She is prescribed flecainide 25 mg by mouth twice daily in addition to atenolol.  Today, she reports her last episode of atrial fibrillation was in 04/2021, and her last episode of atrial flutter was in 10/2021. When out of rhythm, she feels unsettled due to significant fluttering palpitations. Stressful situations are a known trigger for her arrhythmias.  She is compliant with Eliquis. She endorses easy bruising but no bleeding issues.  For exercise she walks frequently.  She denies any chest pain, shortness of breath, or peripheral edema. No lightheadedness, headaches, syncope, orthopnea, or PND.     Past medical, surgical, social and family history were reviewed.  ROS:   Please see the history of present illness.    All other systems reviewed and are negative.  EKGs/Labs/Other Studies Reviewed:    The following studies were reviewed today:  EKG:  The ekg ordered today demonstrates sinus bradycardia with a ventricular rate of 49 bpm.  QRS duration 88 ms.  PR interval 192 ms.   Physical Exam:    VS:  BP 124/64   Pulse (!) 49   Ht '5\' 3"'$  (1.6 m)   Wt 105 lb (47.6 kg)   SpO2 99%   BMI 18.60 kg/m     Wt Readings from Last 3 Encounters:  10/02/22 105 lb (47.6 kg)  03/26/22 105 lb 12.8 oz (48 kg)  11/10/21 103 lb (46.7 kg)     GEN: Well nourished, well developed in no acute  distress CARDIAC: RRR, no murmurs, rubs, gallops RESPIRATORY:  Clear to auscultation without rales, wheezing or rhonchi       ASSESSMENT:    1. PAF (paroxysmal atrial fibrillation) ([name])   2. Hypertension, unspecified type   3. Encounter for long-term (current) use of high-risk medication    PLAN:    In order of problems listed above:  #Paroxysmal atrial fibrillation #High risk medication use-flecainide Good control on low-dose flecainide and atenolol.  Continue these medications at their current dosages.  EKG shows stable intervals on flecainide.  #Hypertension at goal today.  Recommend checking blood pressures 1-2 times per week at home and recording the values.  Recommend bringing these recordings to the primary care physician.   Follow-up 6 months with APP.  I,Mathew Stumpf,acting as a Education administrator for Vickie Epley, MD.,have documented all relevant documentation on the behalf of Vickie Epley, MD,as directed by  Vickie Epley, MD while in the presence of Vickie Epley, MD.  I, Vickie Epley, MD, have reviewed all documentation for this visit. The documentation on 10/02/22 for the exam, diagnosis, procedures, and orders are all accurate and complete.   Signed, Lars Mage, MD, [facility], [facility] 10/02/2022 8:45 AM    Electrophysiology [facility]

## 2022-10-02 NOTE — Patient Instructions (Signed)
Medication Instructions:  Your physician recommends that you continue on your current medications as directed. Please refer to the Current Medication list given to you today.  *If you need a refill on your cardiac medications before your next appointment, please call your pharmacy*  Follow-Up: At [facility], you and your health needs are our priority.  As part of our continuing mission to provide you with exceptional heart care, we have created designated Provider Care Teams.  These Care Teams include your primary Cardiologist (physician) and Advanced Practice Providers (APPs -  Physician Assistants and Nurse Practitioners) who all work together to provide you with the care you need, when you need it.  Your next appointment:   6 month(s)  Provider:   You will see one of the following Advanced Practice Providers on your designated Care Team:   Renee Ursuy, PA-C Michael "Andy" Tillery, PA-C Suzann Riddle, NP    

## 2022-11-08 IMAGING — CT CT HEAD W/O CM
4 series · 17 of 47 positions shown, 19 images · non-contrast
Comparison: None.

CLINICAL DATA: Fall, head injury, contusion

EXAM:
CT HEAD WITHOUT CONTRAST
TECHNIQUE: Contiguous axial images were obtained from the base of the skull
through the vertex without intravenous contrast.

[Series 2: head 5.00 hr40 s3 axial ibhc · axial · 0.39mm/px · z∈[-575,-475]mm · 6 of 29 slices shown, 8 images]
[im 5/29  brain]
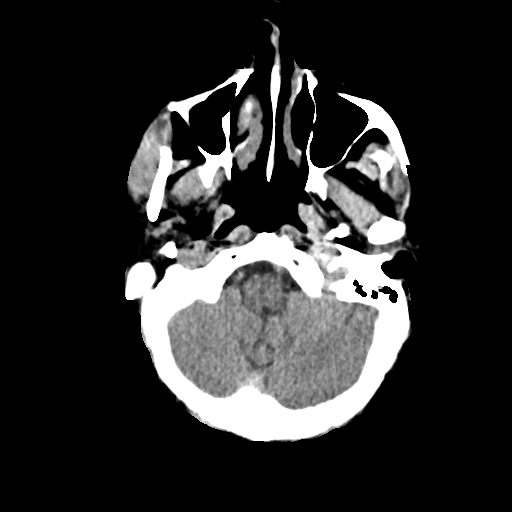
[im 5/29  bone]
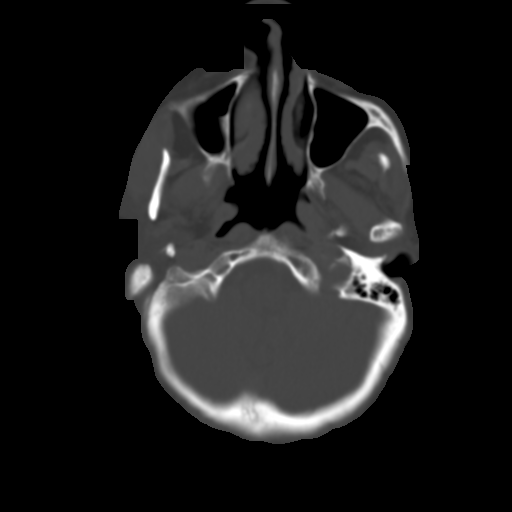
[im 9/29  brain]
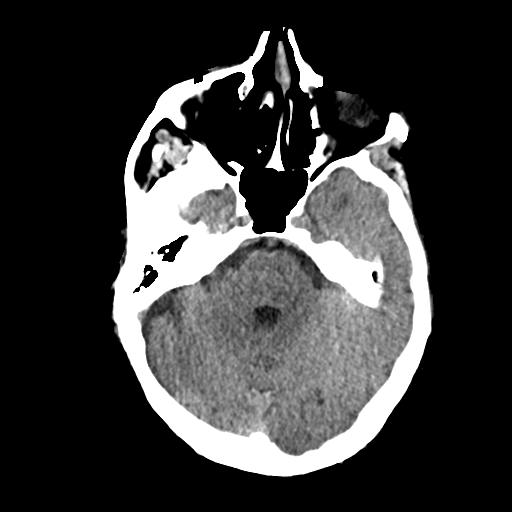
[im 13/29  brain]
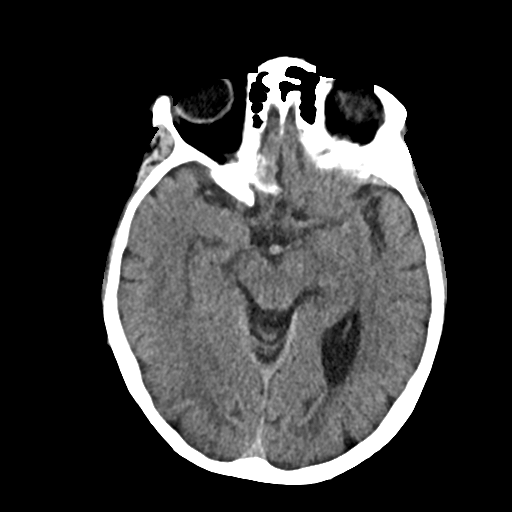
[im 17/29  brain]
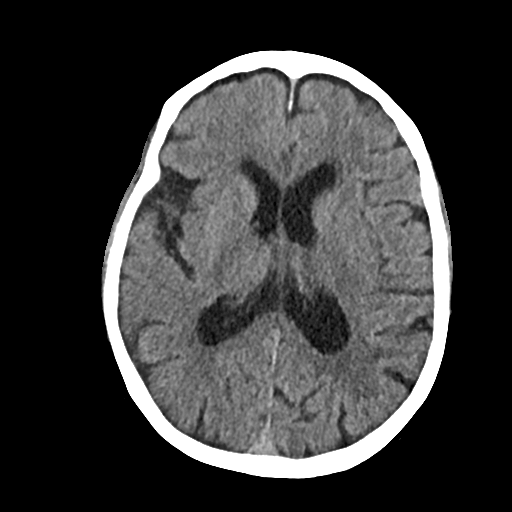
[im 21/29  brain]
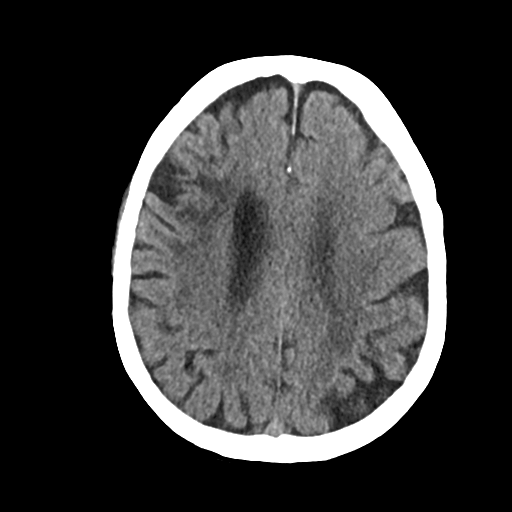
[im 21/29  bone]
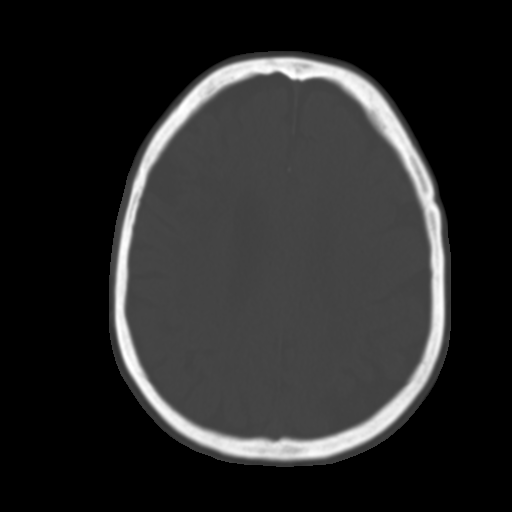
[im 25/29  brain]
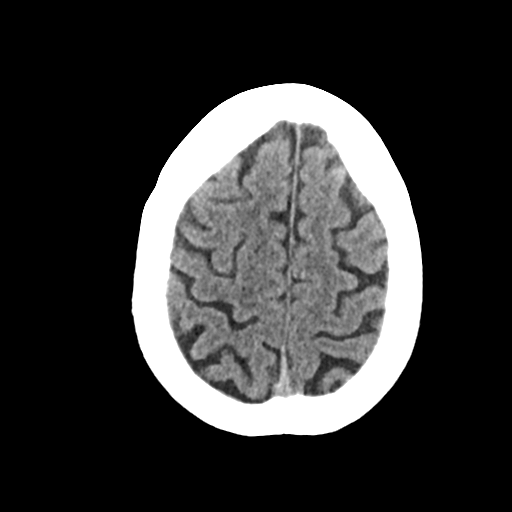

[Series 3: head 2.00 hr60 s3 axial bone · axial · 0.39mm/px · z∈[-584,-514]mm · 5 of 74 slices shown]
[im 7/74  bone]
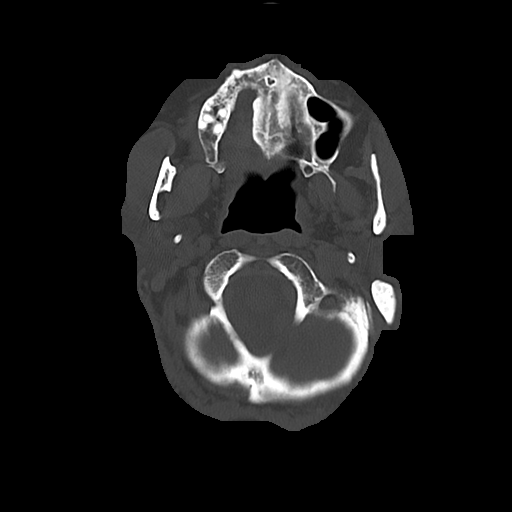
[im 14/74  bone]
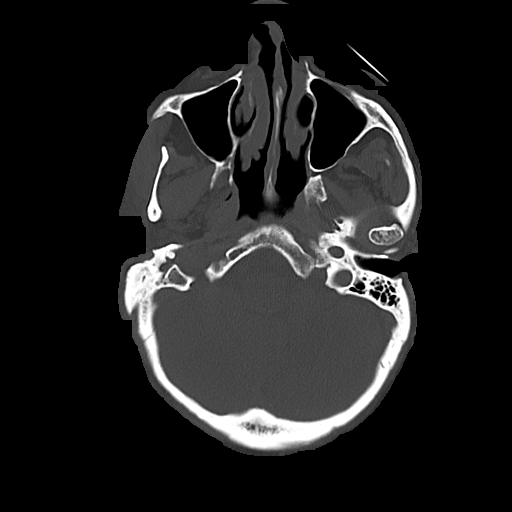
[im 25/74  bone]
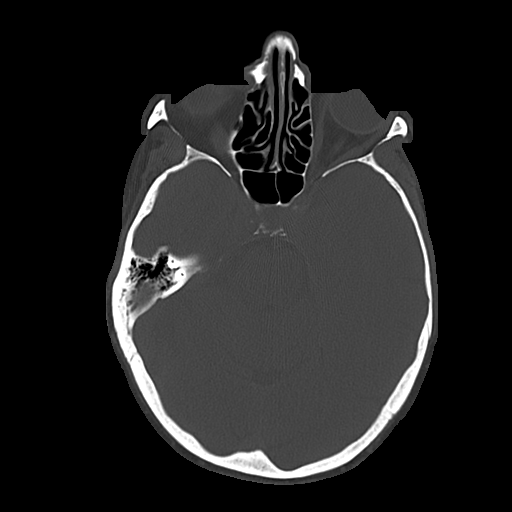
[im 32/74  bone]
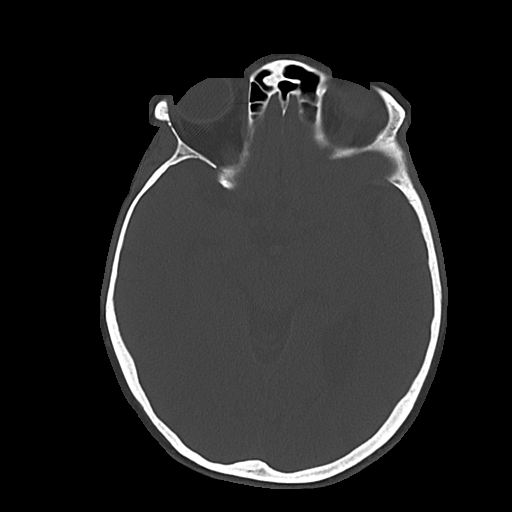
[im 42/74  bone]
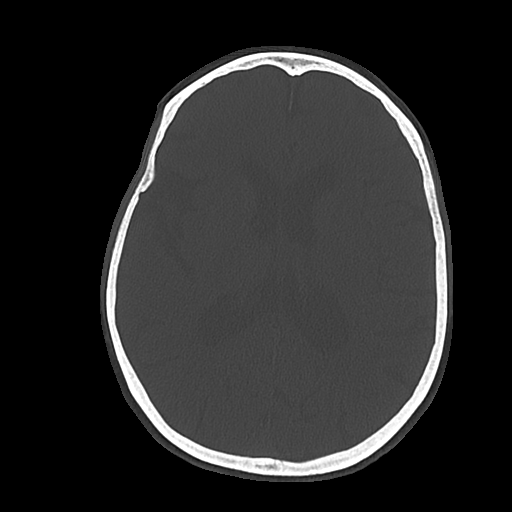

[Series 4: head 3.00 hr40 s3 sag · sagittal · 0.31mm/px · 3 of 53 slices shown]
[im 18/53  brain]
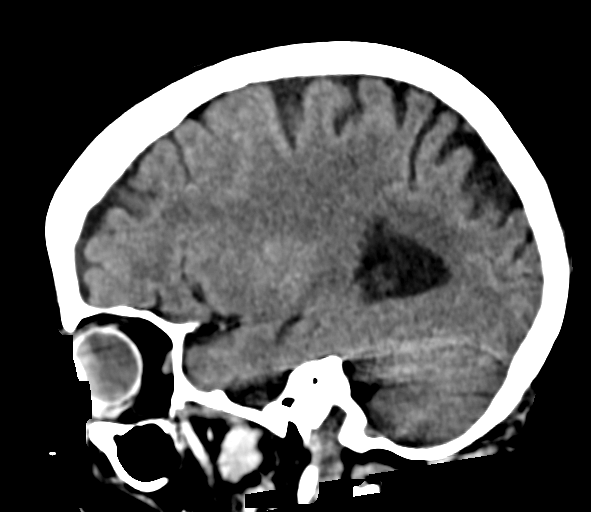
[im 27/53  brain]
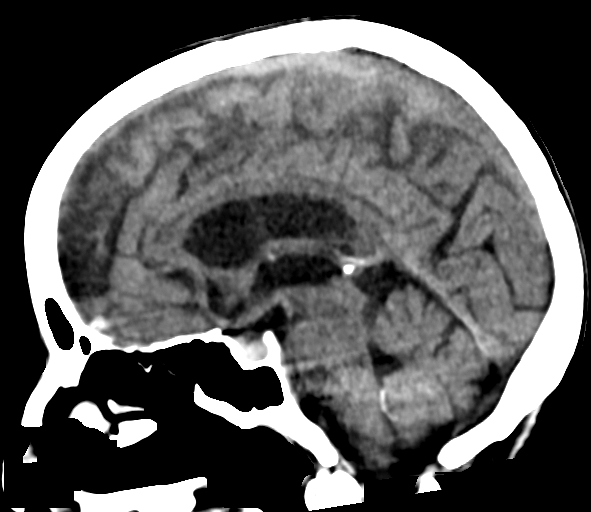
[im 35/53  brain]
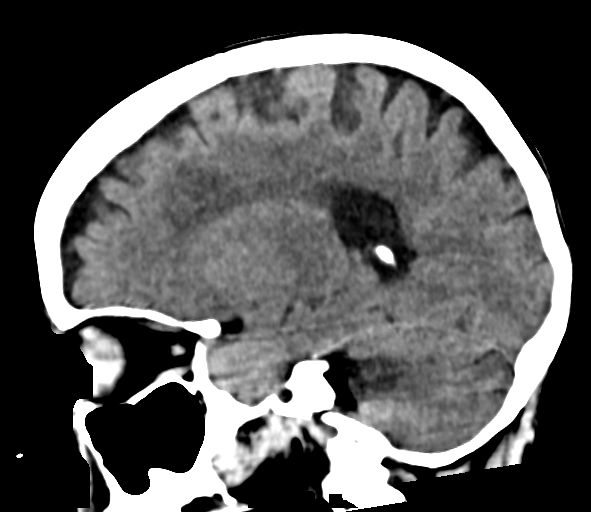

[Series 6: head 3.00 hr40 s3 cor · coronal · 0.31mm/px · 3 of 60 slices shown]
[im 20/60  brain]
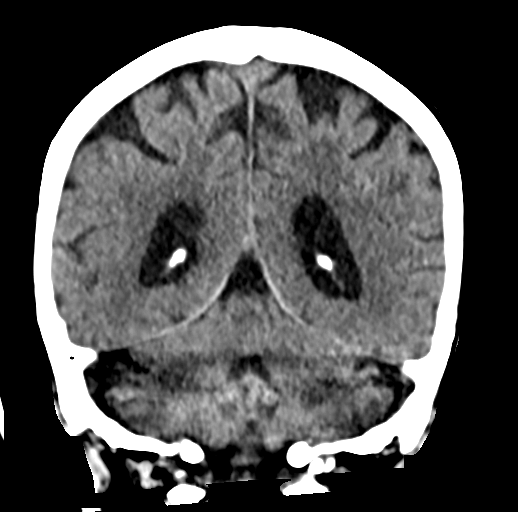
[im 27/60  brain]
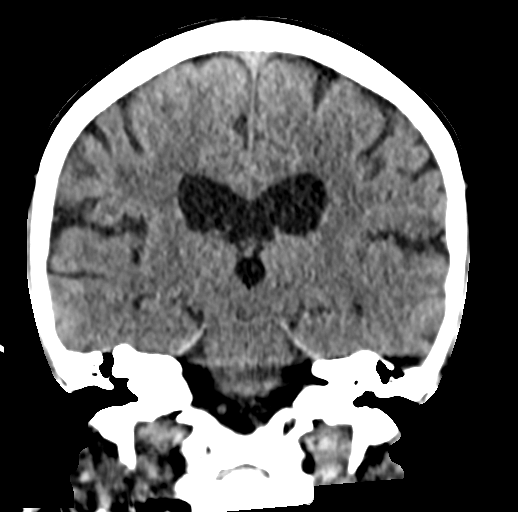
[im 33/60  brain]
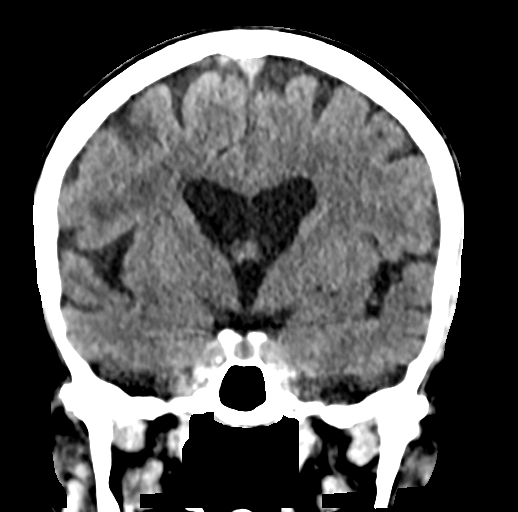

[17 of 47 positions shown; findings below may reference images not displayed]

FINDINGS: Brain: Age related atrophy pattern and remote right frontal infarct
with encephalomalacia.

No acute intracranial hemorrhage mass lesion definite new
infarction, midline shift, herniation, hydrocephalus, or extra-axial
fluid collection.

No focal mass effect or edema. Cisterns are patent. No cerebellar
abnormality.

Vascular: No hyperdense vessel or unexpected calcification.

Skull: Normal. Negative for fracture or focal lesion.

Sinuses/Orbits: No acute finding.

Other: None.
IMPRESSION: Mild atrophy and remote right frontal infarct with encephalomalacia.

No acute intracranial abnormality by noncontrast CT.

## 2022-11-13 ENCOUNTER — Other Ambulatory Visit: Payer: Self-pay | Admitting: Cardiology

## 2022-11-13 ENCOUNTER — Other Ambulatory Visit (HOSPITAL_COMMUNITY): Payer: Self-pay

## 2022-11-13 MED ORDER — ROSUVASTATIN CALCIUM 10 MG PO TABS
10.0000 mg | ORAL_TABLET | Freq: Every day | ORAL | 3 refills | Status: DC
  Filled 2022-11-20: qty 90, 90d supply, fill #0
  Filled 2023-02-25: qty 90, 90d supply, fill #1
  Filled 2023-05-27: qty 90, 90d supply, fill #2
  Filled 2023-08-28: qty 90, 90d supply, fill #3

## 2022-11-13 MED ORDER — ATENOLOL 50 MG PO TABS
50.0000 mg | ORAL_TABLET | Freq: Two times a day (BID) | ORAL | 3 refills | Status: DC
  Filled 2022-11-20: qty 180, 90d supply, fill #0
  Filled 2023-02-25: qty 180, 90d supply, fill #1
  Filled 2023-05-27: qty 180, 90d supply, fill #2
  Filled 2023-08-28: qty 180, 90d supply, fill #3

## 2022-11-13 MED ORDER — FLECAINIDE ACETATE 50 MG PO TABS
25.0000 mg | ORAL_TABLET | Freq: Two times a day (BID) | ORAL | 3 refills | Status: DC
  Filled 2022-11-20: qty 90, 90d supply, fill #0
  Filled 2023-02-25: qty 90, 90d supply, fill #1
  Filled 2023-05-27: qty 90, 90d supply, fill #2
  Filled 2023-08-28: qty 90, 90d supply, fill #3

## 2022-11-20 ENCOUNTER — Other Ambulatory Visit: Payer: Self-pay

## 2022-11-20 ENCOUNTER — Other Ambulatory Visit (HOSPITAL_COMMUNITY): Payer: Self-pay

## 2022-11-20 MED ORDER — BUSPIRONE HCL 10 MG PO TABS
10.0000 mg | ORAL_TABLET | Freq: Two times a day (BID) | ORAL | 4 refills | Status: DC
  Filled 2022-11-20: qty 180, 90d supply, fill #0
  Filled 2023-02-25: qty 180, 90d supply, fill #1
  Filled 2023-05-27: qty 180, 90d supply, fill #2
  Filled 2023-08-28: qty 180, 90d supply, fill #3

## 2022-12-19 ENCOUNTER — Encounter: Payer: Self-pay | Admitting: Cardiology

## 2022-12-19 ENCOUNTER — Ambulatory Visit: Payer: PPO | Attending: Cardiology | Admitting: Cardiology

## 2022-12-19 VITALS — BP 124/70 | HR 61 | Ht 63.0 in | Wt 106.2 lb

## 2022-12-19 DIAGNOSIS — I480 Paroxysmal atrial fibrillation: Secondary | ICD-10-CM

## 2022-12-19 DIAGNOSIS — D6869 Other thrombophilia: Secondary | ICD-10-CM | POA: Diagnosis not present

## 2022-12-19 NOTE — Patient Instructions (Signed)
Medication Instructions:  Your physician recommends that you continue on your current medications as directed. Please refer to the Current Medication list given to you today.  *If you need a refill on your cardiac medications before your next appointment, please call your pharmacy*   Follow-Up: At [facility], you and your health needs are our priority.  As part of our continuing mission to provide you with exceptional heart care, we have created designated Provider Care Teams.  These Care Teams include your primary Cardiologist (physician) and Advanced Practice Providers (APPs -  Physician Assistants and Nurse Practitioners) who all work together to provide you with the care you need, when you need it.   Your next appointment:   1 year(s)  Provider:   Mark Skains, MD     

## 2022-12-19 NOTE — Progress Notes (Signed)
Cardiology Office Note:    Date:  12/19/2022   ID:  LEVI PEPPEL, DOB 1945/12/17, MRN [id number]  PCP:  Daisy Floro, MD   [facility] Providers Cardiologist:  Donato Schultz, MD Electrophysiologist:  Lanier Prude, MD     Referring MD: Daisy Floro, MD    History of Present Illness:    Robin Collins is a 77 y.o. female here for the follow-up of paroxysmal atrial fibrillation with breakthrough December 2022.  Associated with high stress.  Started flecainide in 2019. They and easily with an extra half of flecainide.  Denies any fevers chills nausea vomiting syncope bleeding.  Right renal artery stenosis successfully stented on 12/07/2019 by Dr. Allyson Sabal.  Blood pressure at last visit here with Otilio Saber was elevated.  She was stressed because of check-in issues in the lobby.  Today it is normal.  She shows me 4 separate episodes of atrial fibrillation, 3 from October to December 2022.  A small additional flecainide helps.  She only takes 25 mg twice a day.  She did have an episode of atrial flutter during Easter 2023.  Had a viral illness.  Saw Dr. Graciela Husbands.  Was planning on cardioversion but ended up auto/chemically converting.  She said it felt different than her atrial fibrillation.  No bleeding issues.  Doing well with Eliquis.  Has a home in [place], [place] area.  Enjoys.   Past Medical History:  Diagnosis Date   Aortic regurgitation    Cancer (HCC)    basal cell   Cataract    COLONIC POLYPS, ADENOMATOUS 09/22/2007   Qualifier: Diagnosis of  By: Yetta Barre RN, CGRN, Sheri     Costochondritis    occ. flares, last 3-4 weeks ago.   Depression    mild   Dysphagia, pharyngoesophageal phase 01/20/2015   MITRAL VALVE PROLAPSE 09/22/2007   Qualifier: Diagnosis of  By: Yetta Barre RN, CGRN, Sheri     Multiple thyroid nodules    sees Dr. Layne Benton every 6 months   Palpitations    Paroxysmal atrial fibrillation (HCC)    "palpitations"- occ.now-not  frequent   Temporomandibular joint-pain-dysfunction syndrome (TMJ) 11/16/2010    Past Surgical History:  Procedure Laterality Date   CATARACT EXTRACTION, BILATERAL     COLONOSCOPY W/ POLYPECTOMY     last 12'15   COSMETIC SURGERY     "facial"   DILATION AND CURETTAGE OF UTERUS     ESOPHAGOGASTRODUODENOSCOPY (EGD) WITH PROPOFOL N/A 01/20/2015   Procedure: ESOPHAGOGASTRODUODENOSCOPY (EGD) WITH PROPOFOL;  Surgeon: Hart Carwin, MD;  Location: [facility];  Service: Endoscopy;  Laterality: N/A;   EXCISIONAL HEMORRHOIDECTOMY     EYE SURGERY     PERIPHERAL VASCULAR INTERVENTION Right 12/07/2019   Procedure: PERIPHERAL VASCULAR INTERVENTION;  Surgeon: Runell Gess, MD;  Location: [facility];  Service: Cardiovascular;  Laterality: Right;  renal stent   RENAL ANGIOGRAPHY N/A 12/07/2019   Procedure: RENAL ANGIOGRAPHY;  Surgeon: Runell Gess, MD;  Location: [facility] INV[facility] CV LAB;  Service: Cardiovascular;  Laterality: N/A;   SAVORY DILATION N/A 01/20/2015   Procedure: SAVORY DILATION;  Surgeon: Hart Carwin, MD;  Location: [facility];  Service: Endoscopy;  Laterality: N/A;   TUBAL LIGATION      Current Medications: Current Meds  Medication Sig   acetaminophen (TYLENOL) 500 MG tablet Take 500-1,000 mg by mouth every 6 (six) hours as needed for moderate pain or headache.    ALPRAZolam (XANAX) 0.25 MG tablet Take 0.125 mg by  mouth daily as needed for anxiety.    apixaban (ELIQUIS) 5 MG TABS tablet TAKE 1 TABLET BY MOUTH TWICE DAILY   aspirin EC 81 MG tablet Take 81 mg by mouth daily. Swallow whole.   atenolol (TENORMIN) 50 MG tablet Take 1 tablet by mouth 2 times daily.   B Complex Vitamins (VITAMIN B COMPLEX PO) Take 1 tablet by mouth daily.    busPIRone (BUSPAR) 10 MG tablet Take 1 tablet by mouth twice a day. (Patient taking differently: Take 10 mg by mouth 2 (two) times daily.)   busPIRone (BUSPAR) 10 MG tablet Take 1 tablet (10 mg total) by mouth 2 (two) times daily.    Calcium Carb-Cholecalciferol (CALCIUM 600 + D PO) Take 2 tablets by mouth daily.   Cholecalciferol (DIALYVITE VITAMIN D 5000) 125 MCG (5000 UT) capsule Take 5,000 Units by mouth daily.   COVID-19 mRNA Vac-TriS, Pfizer, (COMIRNATY) SUSP injection Inject into the muscle.   flecainide (TAMBOCOR) 50 MG tablet Take 1/2 tablet by mouth 2 times daily.   influenza vaccine adjuvanted (FLUAD QUADRIVALENT) 0.5 ML injection Inject into the muscle.   Menthol, Topical Analgesic, (BIOFREEZE EX) Apply 1 application topically daily as needed (back/shoulder pain).   Multiple Vitamin (MULTIVITAMIN) tablet Take 1 tablet by mouth daily.   Polyethylene Glycol 400 (BLINK TEARS) 0.25 % SOLN Place 1 drop into both eyes daily as needed (dry eyes).   rosuvastatin (CRESTOR) 10 MG tablet Take 1 tablet (10 mg total) by mouth daily.   Zinc 30 MG TABS Take 30 mg by mouth daily.   Current Facility-Administered Medications for the 12/19/22 encounter (Office Visit) with Jake Bathe, MD  Medication   sodium chloride flush (NS) 0.9 % injection 3 mL     Allergies:   Augmentin [amoxicillin-pot clavulanate] and Vioxx [rofecoxib]   Social History   Socioeconomic History   Marital status: Married    Spouse name: Not on file   Number of children: Not on file   Years of education: Not on file   Highest education level: Not on file  Occupational History   Not on file  Tobacco Use   Smoking status: Former    Types: Cigarettes   Smokeless tobacco: Never   Tobacco comments:    social smoker -no use in 30 yrs  Vaping Use   Vaping Use: Never used  Substance and Sexual Activity   Alcohol use: Yes    Comment: 2-3 times monthly shots of liquor/mixed drinks   Drug use: No   Sexual activity: Not on file  Other Topics Concern   Not on file  Social History Narrative   Not on file   Social Determinants of Health   Financial Resource Strain: Not on file  Food Insecurity: Not on file  Transportation Needs: Not on file   Physical Activity: Not on file  Stress: Not on file  Social Connections: Not on file     Family History: The patient's family history includes Cancer in her brother and father; Diabetes in her brother, brother, mother, sister, and sister; Kidney disease in her father.  ROS:   Please see the history of present illness.    No fevers chills nausea vomiting syncope chest pain all other systems reviewed and are negative.  EKGs/Labs/Other Studies Reviewed:    The following studies were reviewed today: Echocardiogram 09/16/2019-mild aortic regurgitation previously described as moderate.  Normal EF.   EKG: Prior EKG shows sinus bradycardia 56 with no other issues.  Normal intervals.  Recent  Labs: 03/26/2022: BUN 15; Creatinine, Ser 1.09; Hemoglobin 13.1; Platelets 212; Potassium 5.1; Sodium 142  Recent Lipid Panel    Component Value Date/Time   CHOL 189 11/30/2020 0753   TRIG 86 11/30/2020 0753   HDL 93 11/30/2020 0753   CHOLHDL 2.0 11/30/2020 0753   LDLCALC 81 11/30/2020 0753     Risk Assessment/Calculations:              Physical Exam:    VS:  BP 124/70   Pulse 61   Ht 5\' 3"  (1.6 m)   Wt 106 lb 3.2 oz (48.2 kg)   SpO2 98%   BMI 18.81 kg/m     Wt Readings from Last 3 Encounters:  12/19/22 106 lb 3.2 oz (48.2 kg)  10/02/22 105 lb (47.6 kg)  03/26/22 105 lb 12.8 oz (48 kg)     GEN:  Thin, well developed in no acute distress HEENT: Normal NECK: No JVD; No carotid bruits LYMPHATICS: No lymphadenopathy CARDIAC: RRR, no murmurs, no rubs, gallops RESPIRATORY:  Clear to auscultation without rales, wheezing or rhonchi  ABDOMEN: Soft, non-tender, non-distended MUSCULOSKELETAL:  No edema; No deformity  SKIN: Warm and dry NEUROLOGIC:  Alert and oriented x 3 PSYCHIATRIC:  Normal affect   ASSESSMENT:    1. PAF (paroxysmal atrial fibrillation) (HCC)   2. Hypercoagulable state due to paroxysmal atrial fibrillation (HCC)     PLAN:    In order of problems listed  above:    PAF (paroxysmal atrial fibrillation) (HCC) Had atrial flutter 10/2021 - sick with virus. Did not need cardioversion. Continue with current medical management.  No changes made.  On flecainide very low-dose 25 mg twice daily.  No change in medical management.  Stable intervals noted on ECG.  Hypercoagulable state due to atrial fibrillation (HCC) Continue with Eliquis 5 mg twice a day.  Overall doing quite well.  No bleeding on this high risk medication.  Continue to monitor labs closely.  Hemoglobin 13.1 creatinine 1.04  Hyperlipidemia Currently on Crestor 10 mg daily.  LDL 86.  No myalgias.  Continue with current medical management.  Nonrheumatic aortic valve insufficiency Mild to moderate aortic valve regurgitation.  Continue to monitor with echocardiogram.  Prior echo personally reviewed.  Prior echo 2021.  Doing well.  No significant murmurs heard on exam.  No change in symptoms.  Renal artery stenosis (HCC) Right renal artery stenting.  Stable.       Medication Adjustments/Labs and Tests Ordered: Current medicines are reviewed at length with the patient today.  Concerns regarding medicines are outlined above.  No orders of the defined types were placed in this encounter.  No orders of the defined types were placed in this encounter.   Patient Instructions  Medication Instructions:  Your physician recommends that you continue on your current medications as directed. Please refer to the Current Medication list given to you today.  *If you need a refill on your cardiac medications before your next appointment, please call your pharmacy*   Follow-Up: At [facility], you and your health needs are our priority.  As part of our continuing mission to provide you with exceptional heart care, we have created designated Provider Care Teams.  These Care Teams include your primary Cardiologist (physician) and Advanced Practice Providers (APPs -  Physician Assistants  and Nurse Practitioners) who all work together to provide you with the care you need, when you need it.   Your next appointment:   1 year(s)  Provider:  Donato Schultz, MD        Signed, Donato Schultz, MD  12/19/2022 11:18 AM    [facility]

## 2023-02-25 ENCOUNTER — Other Ambulatory Visit: Payer: Self-pay

## 2023-02-25 ENCOUNTER — Other Ambulatory Visit: Payer: Self-pay | Admitting: Student

## 2023-02-25 ENCOUNTER — Other Ambulatory Visit (HOSPITAL_COMMUNITY): Payer: Self-pay

## 2023-02-25 MED ORDER — APIXABAN 5 MG PO TABS
ORAL_TABLET | ORAL | 1 refills | Status: DC
  Filled 2023-02-25: qty 180, 90d supply, fill #0
  Filled 2023-05-27: qty 120, 60d supply, fill #1
  Filled 2023-07-31 – 2023-08-02 (×2): qty 60, 30d supply, fill #2

## 2023-02-25 NOTE — Telephone Encounter (Signed)
Prescription refill request for Eliquis received. Indication: PAF Last office visit: 12/19/22  Michele Rockers MD Scr: 1.04 on 11/13/22  KPN Age: 77 Weight: 48.2kg  Based on above findings Eliquis 5mg  twice daily is the appropriate dose.  Refill approved.

## 2023-04-02 NOTE — Progress Notes (Signed)
  Electrophysiology Office Note:   Date:  04/03/2023  ID:  JOSELYNE KOLIS, DOB 10/11/1945, MRN [id number]  Primary Cardiologist: Donato Schultz, MD Electrophysiologist: Lanier Prude, MD      History of Present Illness:   Robin Collins is a 77 y.o. female with h/o Atrial fibrillation, HTN, and Aortic regurgitation seen today for routine electrophysiology followup.   Since last being seen in our clinic the patient reports doing very well. No breakthrough since spring 2023 in setting of febrile illness.  she denies chest pain, palpitations, dyspnea, PND, orthopnea, nausea, vomiting, dizziness, syncope, edema, weight gain, or early satiety.   Review of systems complete and found to be negative unless listed in HPI.   EP Information / Studies Reviewed:    EKG is ordered today. Personal review as below.  EKG Interpretation Date/Time:  Wednesday April 03 2023 08:06:44 EDT Ventricular Rate:  54 PR Interval:  196 QRS Duration:  82 QT Interval:  440 QTC Calculation: 417 R Axis:   36  Text Interpretation: Sinus bradycardia Nonspecific ST abnormality Confirmed by Maxine Glenn [phone]) on 04/03/2023 8:13:15 AM      Physical Exam:   VS:  BP 128/68   Pulse (!) 54   Ht 5' 2.5" (1.588 m)   Wt 105 lb 12.8 oz (48 kg)   SpO2 98%   BMI 19.04 kg/m    Wt Readings from Last 3 Encounters:  04/03/23 105 lb 12.8 oz (48 kg)  12/19/22 106 lb 3.2 oz (48.2 kg)  10/02/22 105 lb (47.6 kg)     GEN: Well nourished, well developed in no acute distress NECK: No JVD; No carotid bruits CARDIAC: Regular rate and rhythm, no murmurs, rubs, gallops RESPIRATORY:  Clear to auscultation without rales, wheezing or rhonchi  ABDOMEN: Soft, non-tender, non-distended EXTREMITIES:  No edema; No deformity   ASSESSMENT AND PLAN:    Persistent Atrial Fibrillation  EKG today shows NSR with stable intervals Continue Eliquis for CHA2DS2VASC of at least 4 Continue Flecainide 25 mg BID Continue Atenolol 50 mg  BID  HTN Stable on current regimen   Secondary hypercoagulable state Pt on Eliquis as above    Follow up with EP APP in 6 months  Signed, Graciella Freer, PA-C

## 2023-04-03 ENCOUNTER — Telehealth: Payer: Self-pay | Admitting: Cardiology

## 2023-04-03 ENCOUNTER — Ambulatory Visit: Payer: PPO | Attending: Student | Admitting: Student

## 2023-04-03 ENCOUNTER — Encounter: Payer: Self-pay | Admitting: Student

## 2023-04-03 VITALS — BP 128/68 | HR 54 | Ht 62.5 in | Wt 105.8 lb

## 2023-04-03 DIAGNOSIS — I480 Paroxysmal atrial fibrillation: Secondary | ICD-10-CM

## 2023-04-03 DIAGNOSIS — E875 Hyperkalemia: Secondary | ICD-10-CM

## 2023-04-03 DIAGNOSIS — I10 Essential (primary) hypertension: Secondary | ICD-10-CM | POA: Diagnosis not present

## 2023-04-03 NOTE — Telephone Encounter (Signed)
  Received an after-hours page from [facility] regarding critical lab result.  I called and was advised that patient's metabolic panel today showed a potassium of 5.8.  On my personal review of patient's previous labs, she does not have a history of chronic hyperkalemia.  I also do not see any medication in her med list that would cause hyperkalemia.  She was seen in clinic today with ECG that showed stable conduction with QTc, PR interval, and T wave morphology consistent with prior tracings.  Suspect that this lab abnormality could be a result of sample hemolysis. Discussed K level with DOD Dr. Eldridge Dace. Will plan STAT repeat BMP tomorrow. I called and discussed lab result with patient who is agreeable to this plan. We also reviewed her diet and the only potassium rich food she reports is prune juice. I asked her to cease consumption of this until repeat lab results. Discussed ED precautions for palpitations, chest discomfort, shortness of breath and she confirmed understanding.  Perlie Gold, PA-C

## 2023-04-03 NOTE — Patient Instructions (Signed)
Medication Instructions:  Your physician recommends that you continue on your current medications as directed. Please refer to the Current Medication list given to you today.  *If you need a refill on your cardiac medications before your next appointment, please call your pharmacy*  Lab Work: BMET, CBC--TODAY If you have labs (blood work) drawn today and your tests are completely normal, you will receive your results only by: MyChart Message (if you have MyChart) OR A paper copy in the mail If you have any lab test that is abnormal or we need to change your treatment, we will call you to review the results.  Follow-Up: At [facility], you and your health needs are our priority.  As part of our continuing mission to provide you with exceptional heart care, we have created designated Provider Care Teams.  These Care Teams include your primary Cardiologist (physician) and Advanced Practice Providers (APPs -  Physician Assistants and Nurse Practitioners) who all work together to provide you with the care you need, when you need it.  Your next appointment:   6 month(s)  Provider:   Casimiro Needle "Otilio Saber, PA-C

## 2023-04-04 ENCOUNTER — Ambulatory Visit: Payer: PPO | Attending: Cardiology

## 2023-04-04 DIAGNOSIS — E875 Hyperkalemia: Secondary | ICD-10-CM

## 2023-04-04 LAB — BASIC METABOLIC PANEL
BUN/Creatinine Ratio: 14 (ref 12–28)
BUN/Creatinine Ratio: 17 (ref 12–28)
BUN: 16 mg/dL (ref 8–27)
BUN: 18 mg/dL (ref 8–27)
CO2: 24 mmol/L (ref 20–29)
CO2: 27 mmol/L (ref 20–29)
Calcium: 10.1 mg/dL (ref 8.7–10.3)
Calcium: 10.5 mg/dL — ABNORMAL HIGH (ref 8.7–10.3)
Chloride: 105 mmol/L (ref 96–106)
Chloride: 104 mmol/L (ref 96–106)
Creatinine, Ser: 1.12 mg/dL — ABNORMAL HIGH (ref 0.57–1.00)
Creatinine, Ser: 1.06 mg/dL — ABNORMAL HIGH (ref 0.57–1.00)
Glucose: 65 mg/dL — ABNORMAL LOW (ref 70–99)
Glucose: 86 mg/dL (ref 70–99)
Potassium: 5.8 mmol/L (ref 3.5–5.2)
Potassium: 5.1 mmol/L (ref 3.5–5.2)
Sodium: 142 mmol/L (ref 134–144)
Sodium: 141 mmol/L (ref 134–144)
eGFR: 50 mL/min/{1.73_m2} — ABNORMAL LOW (ref >59)
eGFR: 53 mL/min/{1.73_m2} — ABNORMAL LOW (ref >59)

## 2023-04-04 LAB — CBC
Hematocrit: 41.5 % (ref 34.0–46.6)
Hemoglobin: 13.9 g/dL (ref 11.1–15.9)
MCH: 33.1 pg — ABNORMAL HIGH (ref 26.6–33.0)
MCHC: 33.5 g/dL (ref 31.5–35.7)
MCV: 99 fL — ABNORMAL HIGH (ref 79–97)
Platelets: 217 10*3/uL (ref 150–450)
RBC: 4.2 x10E6/uL (ref 3.77–5.28)
RDW: 12.6 % (ref 11.7–15.4)
WBC: 6.7 10*3/uL (ref 3.4–10.8)

## 2023-04-04 NOTE — Telephone Encounter (Signed)
Pt is currently in our lab right now getting her repeat BMET done, as indicated in this message and ordered by Perlie Gold PA-C.

## 2023-05-13 ENCOUNTER — Other Ambulatory Visit (HOSPITAL_BASED_OUTPATIENT_CLINIC_OR_DEPARTMENT_OTHER): Payer: Self-pay

## 2023-05-13 MED ORDER — INFLUENZA VAC A&B SURF ANT ADJ 0.5 ML IM SUSY
0.5000 mL | PREFILLED_SYRINGE | Freq: Once | INTRAMUSCULAR | 0 refills | Status: AC
  Filled 2023-05-13: qty 0.5, 1d supply, fill #0

## 2023-05-13 MED ORDER — COVID-19 MRNA VAC-TRIS(PFIZER) 30 MCG/0.3ML IM SUSY
0.3000 mL | PREFILLED_SYRINGE | Freq: Once | INTRAMUSCULAR | 0 refills | Status: AC
  Filled 2023-05-13: qty 0.3, 1d supply, fill #0

## 2023-05-27 ENCOUNTER — Other Ambulatory Visit (HOSPITAL_COMMUNITY): Payer: Self-pay

## 2023-05-28 ENCOUNTER — Other Ambulatory Visit: Payer: Self-pay

## 2023-05-28 ENCOUNTER — Other Ambulatory Visit (HOSPITAL_COMMUNITY): Payer: Self-pay

## 2023-07-31 ENCOUNTER — Other Ambulatory Visit (HOSPITAL_BASED_OUTPATIENT_CLINIC_OR_DEPARTMENT_OTHER): Payer: Self-pay

## 2023-08-01 ENCOUNTER — Other Ambulatory Visit: Payer: Self-pay

## 2023-08-02 ENCOUNTER — Other Ambulatory Visit (HOSPITAL_BASED_OUTPATIENT_CLINIC_OR_DEPARTMENT_OTHER): Payer: Self-pay

## 2023-08-02 ENCOUNTER — Other Ambulatory Visit: Payer: Self-pay

## 2023-08-02 ENCOUNTER — Other Ambulatory Visit (HOSPITAL_COMMUNITY): Payer: Self-pay

## 2023-08-02 ENCOUNTER — Other Ambulatory Visit: Payer: Self-pay | Admitting: Cardiology

## 2023-08-02 DIAGNOSIS — I480 Paroxysmal atrial fibrillation: Secondary | ICD-10-CM

## 2023-08-02 MED ORDER — APIXABAN 5 MG PO TABS
5.0000 mg | ORAL_TABLET | Freq: Two times a day (BID) | ORAL | 1 refills | Status: DC
  Filled 2023-08-02: qty 180, fill #0
  Filled 2023-08-28: qty 180, 90d supply, fill #0
  Filled 2023-11-25: qty 180, 90d supply, fill #1

## 2023-08-02 NOTE — Telephone Encounter (Signed)
 Eliquis 5mg  refill request received. Patient is 78 years old, weight-48kg, Crea-1.06 on 04/04/23, Diagnosis-Afib, and last seen by Otilio Saber on 04/03/23. Dose is appropriate based on dosing criteria. Will send in refill to requested pharmacy.

## 2023-08-28 ENCOUNTER — Other Ambulatory Visit: Payer: Self-pay

## 2023-08-28 ENCOUNTER — Other Ambulatory Visit (HOSPITAL_COMMUNITY): Payer: Self-pay

## 2023-10-02 NOTE — Progress Notes (Unsigned)
  Electrophysiology Office Note:   Date:  10/03/2023  ID:  Robin Collins, DOB 03-07-1946, MRN [id number]  Primary Cardiologist: Donato Schultz, MD Electrophysiologist: Lanier Prude, MD      History of Present Illness:   Robin Collins is a 78 y.o. female with h/o atrial fib, HTN, and aortic regurg seen today for routine electrophysiology followup.   Since last being seen in our clinic the patient reports doing very well. Denies any breakthrough AF on low dose flecainide. Overall,  she denies chest pain, palpitations, dyspnea, PND, orthopnea, nausea, vomiting, dizziness, syncope, edema, weight gain, or early satiety. BP at home mostly 120-130s.  Review of systems complete and found to be negative unless listed in HPI.   EP Information / Studies Reviewed:    EKG is ordered today. Personal review as below.  EKG Interpretation Date/Time:  Thursday October 03 2023 08:16:35 EST Ventricular Rate:  51 PR Interval:  168 QRS Duration:  90 QT Interval:  462 QTC Calculation: 425 R Axis:   1  Text Interpretation: Sinus bradycardia with Premature atrial complexes Nonspecific T wave abnormality When compared with ECG of 03-Apr-2023 08:06, Premature atrial complexes are now Present Confirmed by Maxine Glenn [phone]) on 10/03/2023 8:31:39 AM    Arrhythmia/Device History No specialty comments available.   Physical Exam:   VS:  BP (!) 140/78   Pulse (!) 51   Ht 5\' 3"  (1.6 m)   Wt 105 lb 3.2 oz (47.7 kg)   SpO2 98%   BMI 18.64 kg/m    Wt Readings from Last 3 Encounters:  10/03/23 105 lb 3.2 oz (47.7 kg)  04/03/23 105 lb 12.8 oz (48 kg)  12/19/22 106 lb 3.2 oz (48.2 kg)     GEN: No acute distress NECK: No JVD; No carotid bruits CARDIAC: Regular rate and rhythm, no murmurs, rubs, gallops RESPIRATORY:  Clear to auscultation without rales, wheezing or rhonchi  ABDOMEN: Soft, non-tender, non-distended EXTREMITIES:  No edema; No deformity   ASSESSMENT AND PLAN:    Persistent AF EKG  today shows NSR with stable intervals.  Eliquis dose 5 mg currently; by age on her next birthday and weight, she will be indicated for dose reduction. Will go ahead and make change after labwork today.  Continue flecainide 25 mg BID Continue atenolol 50 mg BID  HTN Stable on current regimen   Secondary hypercoagulable state Pt on Eliquis as above     Follow up with Dr. Lalla Brothers in 6 months  Signed, Graciella Freer, PA-C

## 2023-10-03 ENCOUNTER — Ambulatory Visit: Payer: PPO | Attending: Student | Admitting: Student

## 2023-10-03 ENCOUNTER — Encounter: Payer: Self-pay | Admitting: Student

## 2023-10-03 VITALS — BP 140/78 | HR 51 | Ht 63.0 in | Wt 105.2 lb

## 2023-10-03 DIAGNOSIS — I10 Essential (primary) hypertension: Secondary | ICD-10-CM | POA: Diagnosis not present

## 2023-10-03 DIAGNOSIS — D6869 Other thrombophilia: Secondary | ICD-10-CM | POA: Diagnosis not present

## 2023-10-03 DIAGNOSIS — I480 Paroxysmal atrial fibrillation: Secondary | ICD-10-CM

## 2023-10-03 NOTE — Patient Instructions (Signed)
Medication Instructions:  Your physician recommends that you continue on your current medications as directed. Please refer to the Current Medication list given to you today.  *If you need a refill on your cardiac medications before your next appointment, please call your pharmacy*  Lab Work: BMET, CBC-TODAY If you have labs (blood work) drawn today and your tests are completely normal, you will receive your results only by: MyChart Message (if you have MyChart) OR A paper copy in the mail If you have any lab test that is abnormal or we need to change your treatment, we will call you to review the results.  Follow-Up: At [facility], you and your health needs are our priority.  As part of our continuing mission to provide you with exceptional heart care, we have created designated Provider Care Teams.  These Care Teams include your primary Cardiologist (physician) and Advanced Practice Providers (APPs -  Physician Assistants and Nurse Practitioners) who all work together to provide you with the care you need, when you need it.  Your next appointment:   6 month(s)  Provider:   Steffanie Dunn, MD or [place]" [place], [place]

## 2023-10-04 LAB — BASIC METABOLIC PANEL
BUN/Creatinine Ratio: 12 (ref 12–28)
BUN: 14 mg/dL (ref 8–27)
CO2: 24 mmol/L (ref 20–29)
Calcium: 10.1 mg/dL (ref 8.7–10.3)
Chloride: 103 mmol/L (ref 96–106)
Creatinine, Ser: 1.16 mg/dL — ABNORMAL HIGH (ref 0.57–1.00)
Glucose: 86 mg/dL (ref 70–99)
Potassium: 5.2 mmol/L (ref 3.5–5.2)
Sodium: 143 mmol/L (ref 134–144)
eGFR: 48 mL/min/{1.73_m2} — ABNORMAL LOW (ref >59)

## 2023-10-04 LAB — CBC
Hematocrit: 45.5 % (ref 34.0–46.6)
Hemoglobin: 15.2 g/dL (ref 11.1–15.9)
MCH: 33.3 pg — ABNORMAL HIGH (ref 26.6–33.0)
MCHC: 33.4 g/dL (ref 31.5–35.7)
MCV: 100 fL — ABNORMAL HIGH (ref 79–97)
Platelets: 244 10*3/uL (ref 150–450)
RBC: 4.56 x10E6/uL (ref 3.77–5.28)
RDW: 12.3 % (ref 11.7–15.4)
WBC: 7.4 10*3/uL (ref 3.4–10.8)

## 2023-10-22 ENCOUNTER — Other Ambulatory Visit (HOSPITAL_COMMUNITY): Payer: Self-pay

## 2023-10-22 MED ORDER — DOXYCYCLINE HYCLATE 100 MG PO CAPS
100.0000 mg | ORAL_CAPSULE | Freq: Two times a day (BID) | ORAL | 0 refills | Status: AC
  Filled 2023-10-22: qty 10, 5d supply, fill #0

## 2023-11-20 ENCOUNTER — Encounter (INDEPENDENT_AMBULATORY_CARE_PROVIDER_SITE_OTHER): Payer: Self-pay | Admitting: Cardiology

## 2023-11-20 DIAGNOSIS — I701 Atherosclerosis of renal artery: Secondary | ICD-10-CM

## 2023-11-20 NOTE — Telephone Encounter (Signed)
 Please see the MyChart message reply(ies) for my assessment and plan.    I am excited to hear that you are going to work out with a trainer. This is great.  My recommendation is to proceed with this.  There should be no risk to your right renal artery stent that was placed in 2021.  In fact, we strongly recommend exercise and our stented patients.  When combined with Mediterranean diet, exercise especially strength training as you were describing is excellent.  This patient gave consent for this Medical Advice Message and is aware that it may result in a bill to [employer], as well as the possibility of receiving a bill for a co-payment or deductible. They are an established patient, but are not seeking medical advice exclusively about a problem treated during an in person or video visit in the last seven days. I did not recommend an in person or video visit within seven days of my reply.    I spent a total of 5 minutes cumulative time within 7 days through [employer].  Dorothye Gathers, MD

## 2023-11-25 ENCOUNTER — Other Ambulatory Visit (HOSPITAL_COMMUNITY): Payer: Self-pay

## 2023-11-25 ENCOUNTER — Other Ambulatory Visit: Payer: Self-pay | Admitting: Cardiology

## 2023-11-25 ENCOUNTER — Other Ambulatory Visit: Payer: Self-pay

## 2023-11-25 MED ORDER — BUSPIRONE HCL 10 MG PO TABS
10.0000 mg | ORAL_TABLET | Freq: Two times a day (BID) | ORAL | 4 refills | Status: DC
  Filled 2023-11-25: qty 180, 90d supply, fill #0

## 2023-11-27 ENCOUNTER — Other Ambulatory Visit: Payer: Self-pay

## 2023-11-27 ENCOUNTER — Encounter (HOSPITAL_COMMUNITY): Payer: Self-pay

## 2023-11-27 ENCOUNTER — Telehealth: Payer: Self-pay | Admitting: Cardiology

## 2023-11-27 ENCOUNTER — Other Ambulatory Visit (HOSPITAL_COMMUNITY): Payer: Self-pay

## 2023-11-27 MED ORDER — FLECAINIDE ACETATE 50 MG PO TABS
25.0000 mg | ORAL_TABLET | Freq: Two times a day (BID) | ORAL | 0 refills | Status: DC
Start: 2023-11-27 — End: 2024-02-20
  Filled 2023-11-27: qty 90, 90d supply, fill #0

## 2023-11-27 MED ORDER — ATENOLOL 50 MG PO TABS
50.0000 mg | ORAL_TABLET | Freq: Two times a day (BID) | ORAL | 0 refills | Status: DC
  Filled 2023-11-27: qty 180, 90d supply, fill #0

## 2023-11-27 MED ORDER — ROSUVASTATIN CALCIUM 10 MG PO TABS
10.0000 mg | ORAL_TABLET | Freq: Every day | ORAL | 0 refills | Status: DC
  Filled 2023-11-27: qty 90, 90d supply, fill #0

## 2023-11-27 NOTE — Telephone Encounter (Signed)
 Pt's medications were sent to pt's pharmacy as requested. Confirmation received.

## 2023-11-27 NOTE — Telephone Encounter (Signed)
*  STAT* If patient is at the pharmacy, call can be transferred to refill team.   1. Which medications need to be refilled? (please list name of each medication and dose if known) atenolol  (TENORMIN ) 50 MG tablet   flecainide  (TAMBOCOR ) 50 MG tablet   rosuvastatin  (CRESTOR ) 10 MG tablet   2. Which pharmacy/location (including street and city if local pharmacy) is medication to be sent to? [place] [facility]   3. Do they need a 30 day or 90 day supply? 90

## 2023-12-05 ENCOUNTER — Other Ambulatory Visit (HOSPITAL_COMMUNITY): Payer: Self-pay

## 2023-12-05 MED ORDER — BUSPIRONE HCL 10 MG PO TABS
10.0000 mg | ORAL_TABLET | Freq: Two times a day (BID) | ORAL | 4 refills | Status: AC
  Filled 2023-12-05 – 2024-02-20 (×2): qty 180, 90d supply, fill #0
  Filled 2024-05-19: qty 180, 90d supply, fill #1
  Filled 2024-08-19: qty 180, 90d supply, fill #2

## 2023-12-24 ENCOUNTER — Encounter: Payer: Self-pay | Admitting: Cardiology

## 2023-12-24 ENCOUNTER — Ambulatory Visit: Payer: PPO | Attending: Cardiology | Admitting: Cardiology

## 2023-12-24 VITALS — BP 148/76 | HR 58 | Ht 63.0 in | Wt 106.8 lb

## 2023-12-24 DIAGNOSIS — D6869 Other thrombophilia: Secondary | ICD-10-CM | POA: Diagnosis not present

## 2023-12-24 DIAGNOSIS — I701 Atherosclerosis of renal artery: Secondary | ICD-10-CM | POA: Diagnosis not present

## 2023-12-24 DIAGNOSIS — I480 Paroxysmal atrial fibrillation: Secondary | ICD-10-CM | POA: Diagnosis not present

## 2023-12-24 NOTE — Patient Instructions (Signed)

## 2023-12-24 NOTE — Progress Notes (Signed)
 Cardiology Office Note:  .   Date:  12/24/2023  ID:  NIKKIE LIMING, DOB 07/11/1946, MRN [id number] PCP: Jimmey Mould, MD  [facility] Providers Cardiologist:  Dorothye Gathers, MD Electrophysiologist:  Boyce Byes, MD     History of Present Illness: .   Robin Collins is a 78 y.o. female Discussed the use of AI scribe software for clinical note transcription with the patient, who gave verbal consent to proceed.  History of Present Illness Robin Collins is an 78 year old female with persistent atrial fibrillation who presents for follow-up.  She has a history of persistent atrial fibrillation and is currently on flecainide  25 mg twice a day and atenolol  50 mg twice a day. No episodes of atrial fibrillation have occurred in the past two years. Flecainide  was initiated in 2019, with breakthrough paroxysmal atrial fibrillation noted in December 2022. She has not undergone ablation therapy.  Her hypertension is stable on her current regimen, and she is on Eliquis  for chronic anticoagulation. Blood pressures at home are mostly in the 120s to 130s. Her last recorded heart rate was 51 beats per minute.  She has right renal artery stenosis and peripheral vascular disease, which were successfully stented on Dec 07, 2019. Additionally, she has aortic valve insufficiency with moderate regurgitation noted on a prior echocardiogram.  She engages in regular physical activity, including walking and strength training with a personal trainer twice a week to improve strength and balance. This regimen is working well for her.  She mentions experiencing stress due to her oldest sister's critical health condition, which has affected her sleep. Her sister, aged 57, has been a diabetic and has asthma, and recently fell and broke her femur, leading to a challenging recovery. Hospice.  No chest pain, major bleeding, or falls. She reports occasional premature ventricular contractions (PVCs) but no  significant issues.     Studies Reviewed: .        Results LABS Hb: 13.1 g/dL (78/29/5621) Cr: 1.1 mg/dL (30/86/5784) LDL: 87 mg/dL (69/62/9528)  DIAGNOSTIC ECG: Normal sinus rhythm Echocardiogram: Moderate aortic valve regurgitation Risk Assessment/Calculations:           Physical Exam:   VS:  BP (!) 148/76   Pulse (!) 58   Ht 5\' 3"  (1.6 m)   Wt 106 lb 12.8 oz (48.4 kg)   SpO2 100%   BMI 18.92 kg/m    Wt Readings from Last 3 Encounters:  12/24/23 106 lb 12.8 oz (48.4 kg)  10/03/23 105 lb 3.2 oz (47.7 kg)  04/03/23 105 lb 12.8 oz (48 kg)    GEN: Well nourished, well developed in no acute distress NECK: No JVD; No carotid bruits CARDIAC: RRR, no murmurs, no rubs, no gallops RESPIRATORY:  Clear to auscultation without rales, wheezing or rhonchi  ABDOMEN: Soft, non-tender, non-distended EXTREMITIES:  No edema; No deformity   ASSESSMENT AND PLAN: .    Assessment and Plan Assessment & Plan Persistent atrial fibrillation Persistent atrial fibrillation is well-controlled with flecainide  25 mg twice daily and atenolol  50 mg twice daily. No episodes of atrial fibrillation in the past two years. Occasional PVCs present, but no significant symptoms. No history of ablation. She is on the lowest dose of flecainide , effectively maintaining sinus rhythm. - Continue flecainide  25 mg oral twice daily - Continue atenolol  50 mg oral twice daily - Continue apixaban  5 mg oral twice daily for anticoagulation (has easy bruising but not major bleeds)  Hypertension Hypertension is well-managed  with current medication regimen. Home blood pressures are mostly in the 120s to 130s mmHg. Last heart rate was 51 bpm, slightly bradycardic with PACs but overall doing well. - Continue current antihypertensive regimen  Nonrheumatic aortic valve insufficiency Moderate aortic valve insufficiency noted on prior echocardiogram. Currently asymptomatic.  Right renal artery stenosis Right renal  artery stenosis with successful stenting on 12/07/2019 by Dr. Katheryne Pane. No current issues.  Hyperlipidemia Hyperlipidemia is well-controlled with rosuvastatin  10 mg daily. Recent LDL level is 87 mg/dL. - Continue rosuvastatin  10 mg oral daily         Dispo: 1 yr  Signed, Dorothye Gathers, MD

## 2024-02-20 ENCOUNTER — Other Ambulatory Visit (HOSPITAL_BASED_OUTPATIENT_CLINIC_OR_DEPARTMENT_OTHER): Payer: Self-pay

## 2024-02-20 ENCOUNTER — Other Ambulatory Visit: Payer: Self-pay | Admitting: Cardiology

## 2024-02-20 ENCOUNTER — Other Ambulatory Visit (HOSPITAL_COMMUNITY): Payer: Self-pay

## 2024-02-20 ENCOUNTER — Other Ambulatory Visit: Payer: Self-pay

## 2024-02-20 DIAGNOSIS — I480 Paroxysmal atrial fibrillation: Secondary | ICD-10-CM

## 2024-02-20 MED ORDER — ROSUVASTATIN CALCIUM 10 MG PO TABS
10.0000 mg | ORAL_TABLET | Freq: Every day | ORAL | 3 refills | Status: AC
  Filled 2024-02-20: qty 90, 90d supply, fill #0
  Filled 2024-05-19: qty 90, 90d supply, fill #1
  Filled 2024-08-19: qty 90, 90d supply, fill #2

## 2024-02-20 MED ORDER — ATENOLOL 50 MG PO TABS
50.0000 mg | ORAL_TABLET | Freq: Two times a day (BID) | ORAL | 3 refills | Status: AC
  Filled 2024-02-20: qty 180, 90d supply, fill #0
  Filled 2024-05-19: qty 180, 90d supply, fill #1
  Filled 2024-08-19: qty 180, 90d supply, fill #2

## 2024-02-20 MED ORDER — APIXABAN 2.5 MG PO TABS
2.5000 mg | ORAL_TABLET | Freq: Two times a day (BID) | ORAL | 1 refills | Status: DC
  Filled 2024-02-20: qty 180, 90d supply, fill #0
  Filled 2024-05-19: qty 180, 90d supply, fill #1

## 2024-02-20 MED ORDER — FLECAINIDE ACETATE 50 MG PO TABS
25.0000 mg | ORAL_TABLET | Freq: Two times a day (BID) | ORAL | 3 refills | Status: AC
  Filled 2024-02-20: qty 90, 90d supply, fill #0
  Filled 2024-05-19: qty 90, 90d supply, fill #1
  Filled 2024-08-19: qty 90, 90d supply, fill #2

## 2024-02-20 NOTE — Telephone Encounter (Signed)
 Per Chris Pavero, PharmD, does should be decreased to 2.5mg  BID. Called pt and made her aware of dose change.

## 2024-02-20 NOTE — Telephone Encounter (Signed)
 Prescription refill request for Eliquis  received. Indication: Afib  Last office visit: 12/24/23 Cecile)  Scr: 1.16 (10/03/23)  Age: 78 Weight: 48.8kg  Per dosing criteria, current dose may not be appropriate. Will forward to PharmD team to advise.

## 2024-02-22 ENCOUNTER — Other Ambulatory Visit (HOSPITAL_COMMUNITY): Payer: Self-pay

## 2024-03-11 ENCOUNTER — Other Ambulatory Visit (HOSPITAL_COMMUNITY): Payer: Self-pay

## 2024-04-02 ENCOUNTER — Encounter: Payer: Self-pay | Admitting: Cardiology

## 2024-04-09 NOTE — Progress Notes (Unsigned)
  Electrophysiology Office Note:   Date:  04/09/2024  ID:  Robin Collins, DOB 30-Jul-1946, MRN [id number]  Primary Cardiologist: Oneil Parchment, MD Electrophysiologist: OLE ONEIDA HOLTS, MD   Electrophysiologist:  OLE ONEIDA HOLTS, MD  {Click to update primary MD,subspecialty MD or APP then REFRESH:1}    History of Present Illness:   Robin Collins is a 78 y.o. female with h/o atrial fib, HTN, and aortic regurg seen today for routine electrophysiology followup.   Since last being seen in our clinic the patient reports doing ***.  she denies chest pain, palpitations, dyspnea, PND, orthopnea, nausea, vomiting, dizziness, syncope, edema, weight gain, or early satiety.   Review of systems complete and found to be negative unless listed in HPI.   EP Information / Studies Reviewed:    EKG is ordered today. Personal review as below.       Arrhythmia/Device History No specialty comments available.   Physical Exam:   VS:  There were no vitals taken for this visit.   Wt Readings from Last 3 Encounters:  12/24/23 106 lb 12.8 oz (48.4 kg)  10/03/23 105 lb 3.2 oz (47.7 kg)  04/03/23 105 lb 12.8 oz (48 kg)     GEN: No acute distress NECK: No JVD; No carotid bruits CARDIAC: {EPRHYTHM:28826}, no murmurs, rubs, gallops RESPIRATORY:  Clear to auscultation without rales, wheezing or rhonchi  ABDOMEN: Soft, non-tender, non-distended EXTREMITIES:  {EDEMA LEVEL:28147::No} edema; No deformity   ASSESSMENT AND PLAN:    Persistent AFb EKG today shows *** Continue eliquis  2.5 mg BID  Continue flecainide  25 mg BID Continue atenolol  50 mg BID  HTN Stable on current regimen   Secondary hypercoagulable state Pt on Eliquis  as above   {Click here to Review PMH, Prob List, Meds, Allergies, SHx, FHx  :1}   Follow up with {EPMDS:28135::EP Team} {EPFOLLOW UP:28173}  Signed, Ozell Prentice Passey, PA-C

## 2024-04-10 ENCOUNTER — Encounter: Payer: Self-pay | Admitting: Student

## 2024-04-10 ENCOUNTER — Ambulatory Visit: Attending: Student | Admitting: Student

## 2024-04-10 VITALS — BP 136/64 | HR 52 | Ht 62.5 in | Wt 102.4 lb

## 2024-04-10 DIAGNOSIS — D6869 Other thrombophilia: Secondary | ICD-10-CM | POA: Diagnosis not present

## 2024-04-10 DIAGNOSIS — I10 Essential (primary) hypertension: Secondary | ICD-10-CM | POA: Diagnosis not present

## 2024-04-10 DIAGNOSIS — I480 Paroxysmal atrial fibrillation: Secondary | ICD-10-CM | POA: Diagnosis not present

## 2024-04-10 NOTE — Patient Instructions (Signed)
 Medication Instructions:  Your physician recommends that you continue on your current medications as directed. Please refer to the Current Medication list given to you today.  *If you need a refill on your cardiac medications before your next appointment, please call your pharmacy*  Lab Work: NONE If you have labs (blood work) drawn today and your tests are completely normal, you will receive your results only by: MyChart Message (if you have MyChart) OR A paper copy in the mail If you have any lab test that is abnormal or we need to change your treatment, we will call you to review the results.  Testing/Procedures: NONE  Follow-Up: At [facility], you and your health needs are our priority.  As part of our continuing mission to provide you with exceptional heart care, our providers are all part of one team.  This team includes your primary Cardiologist (physician) and Advanced Practice Providers or APPs (Physician Assistants and Nurse Practitioners) who all work together to provide you with the care you need, when you need it.  Your next appointment:   6 month(s)  Provider:   You may see OLE ONEIDA HOLTS, MD or one of the following Advanced Practice Providers on your designated Care Team:   Charlies Arthur, [place] Ozell Jodie Passey, PA-C Suzann Riddle, NP Daphne Barrack, NP Artist Pouch, PA-C

## 2024-05-29 ENCOUNTER — Other Ambulatory Visit (HOSPITAL_BASED_OUTPATIENT_CLINIC_OR_DEPARTMENT_OTHER): Payer: Self-pay

## 2024-05-29 MED ORDER — COMIRNATY 30 MCG/0.3ML IM SUSY
0.3000 mL | PREFILLED_SYRINGE | Freq: Once | INTRAMUSCULAR | 0 refills | Status: AC
  Filled 2024-05-29: qty 0.3, 1d supply, fill #0

## 2024-05-29 MED ORDER — FLUZONE HIGH-DOSE 0.5 ML IM SUSY
0.5000 mL | PREFILLED_SYRINGE | Freq: Once | INTRAMUSCULAR | 0 refills | Status: AC
  Filled 2024-05-29: qty 0.5, 1d supply, fill #0

## 2024-06-15 ENCOUNTER — Other Ambulatory Visit (HOSPITAL_BASED_OUTPATIENT_CLINIC_OR_DEPARTMENT_OTHER): Payer: Self-pay

## 2024-06-15 MED ORDER — SHINGRIX 50 MCG/0.5ML IM SUSR
0.5000 mL | Freq: Once | INTRAMUSCULAR | 0 refills | Status: AC
  Filled 2024-06-15: qty 0.5, 1d supply, fill #0

## 2024-07-16 ENCOUNTER — Other Ambulatory Visit: Payer: Self-pay

## 2024-07-16 MED ORDER — TIZANIDINE HCL 2 MG PO TABS
2.0000 mg | ORAL_TABLET | Freq: Every evening | ORAL | 0 refills | Status: AC | PRN
  Filled 2024-07-16: qty 10, 10d supply, fill #0

## 2024-08-14 ENCOUNTER — Other Ambulatory Visit: Payer: Self-pay | Admitting: Cardiology

## 2024-08-14 ENCOUNTER — Other Ambulatory Visit: Payer: Self-pay

## 2024-08-14 DIAGNOSIS — I480 Paroxysmal atrial fibrillation: Secondary | ICD-10-CM

## 2024-08-14 MED ORDER — APIXABAN 2.5 MG PO TABS
2.5000 mg | ORAL_TABLET | Freq: Two times a day (BID) | ORAL | 1 refills | Status: AC
  Filled 2024-08-14: qty 180, 90d supply, fill #0

## 2024-08-14 NOTE — Telephone Encounter (Signed)
 Eliquis  2.5mg  refill request received. Patient is 79 years old, weight-46.4kg, Crea-1.13 on 11/30/23 via [facility] from [place], Diagnosis-Afib, and last seen by Jodie Passey  on 04/10/24. Dose is appropriate based on dosing criteria. Will send in refill to requested pharmacy.

## 2024-12-24 ENCOUNTER — Ambulatory Visit: Admitting: Cardiology
